# Patient Record
Sex: Female | Born: 1975 | Race: Black or African American | Hispanic: No | Smoking: Former smoker
Health system: Southern US, Community
[De-identification: ages and names within clinical notes are randomized; demographics above are authoritative.]

## PROBLEM LIST (undated history)

## (undated) ENCOUNTER — Ambulatory Visit: Source: Home / Self Care

## (undated) DIAGNOSIS — K8020 Calculus of gallbladder without cholecystitis without obstruction: Secondary | ICD-10-CM

## (undated) DIAGNOSIS — K219 Gastro-esophageal reflux disease without esophagitis: Secondary | ICD-10-CM

## (undated) DIAGNOSIS — I10 Essential (primary) hypertension: Secondary | ICD-10-CM

## (undated) DIAGNOSIS — I2699 Other pulmonary embolism without acute cor pulmonale: Secondary | ICD-10-CM

## (undated) HISTORY — DX: Calculus of gallbladder without cholecystitis without obstruction: K80.20

## (undated) HISTORY — DX: Other pulmonary embolism without acute cor pulmonale: I26.99

---

## 1995-03-16 HISTORY — PX: TUBAL LIGATION: SHX77

## 1998-07-03 ENCOUNTER — Other Ambulatory Visit: Admission: RE | Admit: 1998-07-03 | Discharge: 1998-07-03 | Payer: Self-pay | Admitting: Obstetrics

## 1998-07-07 ENCOUNTER — Ambulatory Visit (HOSPITAL_COMMUNITY): Admission: RE | Admit: 1998-07-07 | Discharge: 1998-07-07 | Payer: Self-pay | Admitting: Obstetrics

## 1998-07-07 ENCOUNTER — Encounter: Payer: Self-pay | Admitting: Obstetrics

## 2000-03-20 ENCOUNTER — Emergency Department (HOSPITAL_COMMUNITY): Admission: EM | Admit: 2000-03-20 | Discharge: 2000-03-20 | Payer: Self-pay | Admitting: Emergency Medicine

## 2000-05-08 ENCOUNTER — Inpatient Hospital Stay (HOSPITAL_COMMUNITY): Admission: AD | Admit: 2000-05-08 | Discharge: 2000-05-08 | Payer: Self-pay | Admitting: Obstetrics

## 2000-12-30 ENCOUNTER — Other Ambulatory Visit: Admission: RE | Admit: 2000-12-30 | Discharge: 2000-12-30 | Payer: Self-pay | Admitting: Internal Medicine

## 2000-12-30 ENCOUNTER — Encounter: Admission: RE | Admit: 2000-12-30 | Discharge: 2000-12-30 | Payer: Self-pay | Admitting: Internal Medicine

## 2001-01-02 ENCOUNTER — Encounter: Admission: RE | Admit: 2001-01-02 | Discharge: 2001-01-02 | Payer: Self-pay | Admitting: Internal Medicine

## 2001-01-02 ENCOUNTER — Encounter: Payer: Self-pay | Admitting: Internal Medicine

## 2001-05-22 ENCOUNTER — Encounter: Admission: RE | Admit: 2001-05-22 | Discharge: 2001-08-20 | Payer: Self-pay | Admitting: Internal Medicine

## 2005-12-14 ENCOUNTER — Encounter: Admission: RE | Admit: 2005-12-14 | Discharge: 2005-12-14 | Payer: Self-pay | Admitting: Internal Medicine

## 2007-01-06 ENCOUNTER — Ambulatory Visit (HOSPITAL_COMMUNITY): Admission: RE | Admit: 2007-01-06 | Discharge: 2007-01-06 | Payer: Self-pay | Admitting: Internal Medicine

## 2010-08-16 ENCOUNTER — Emergency Department (HOSPITAL_COMMUNITY): Payer: 59

## 2010-08-16 ENCOUNTER — Emergency Department (HOSPITAL_COMMUNITY)
Admission: EM | Admit: 2010-08-16 | Discharge: 2010-08-16 | Disposition: A | Discharge status: Home / Self Care | Payer: 59 | Attending: Emergency Medicine | Admitting: Emergency Medicine

## 2010-08-16 DIAGNOSIS — M25579 Pain in unspecified ankle and joints of unspecified foot: Secondary | ICD-10-CM | POA: Insufficient documentation

## 2010-08-16 DIAGNOSIS — X500XXA Overexertion from strenuous movement or load, initial encounter: Secondary | ICD-10-CM | POA: Insufficient documentation

## 2010-08-16 DIAGNOSIS — S93409A Sprain of unspecified ligament of unspecified ankle, initial encounter: Secondary | ICD-10-CM | POA: Insufficient documentation

## 2012-11-23 ENCOUNTER — Other Ambulatory Visit: Payer: Self-pay | Admitting: Internal Medicine

## 2012-11-23 DIAGNOSIS — N6029 Fibroadenosis of unspecified breast: Secondary | ICD-10-CM

## 2012-12-06 ENCOUNTER — Other Ambulatory Visit: Payer: 59

## 2013-01-02 ENCOUNTER — Ambulatory Visit
Admission: RE | Admit: 2013-01-02 | Discharge: 2013-01-02 | Disposition: A | Discharge status: Home / Self Care | Payer: 59 | Source: Ambulatory Visit | Attending: Internal Medicine | Admitting: Internal Medicine

## 2013-01-02 DIAGNOSIS — N6029 Fibroadenosis of unspecified breast: Secondary | ICD-10-CM

## 2014-01-02 ENCOUNTER — Other Ambulatory Visit: Payer: Self-pay | Admitting: Internal Medicine

## 2014-01-02 DIAGNOSIS — N63 Unspecified lump in unspecified breast: Secondary | ICD-10-CM

## 2014-01-14 ENCOUNTER — Ambulatory Visit
Admission: RE | Admit: 2014-01-14 | Discharge: 2014-01-14 | Disposition: A | Discharge status: Home / Self Care | Payer: 59 | Source: Ambulatory Visit | Attending: Internal Medicine | Admitting: Internal Medicine

## 2014-01-14 ENCOUNTER — Encounter (INDEPENDENT_AMBULATORY_CARE_PROVIDER_SITE_OTHER): Payer: Self-pay

## 2014-01-14 DIAGNOSIS — N63 Unspecified lump in unspecified breast: Secondary | ICD-10-CM

## 2014-05-28 ENCOUNTER — Ambulatory Visit (INDEPENDENT_AMBULATORY_CARE_PROVIDER_SITE_OTHER): Payer: 59 | Admitting: Physician Assistant

## 2014-05-28 VITALS — BP 134/85 | HR 84 | Temp 98.2°F | Resp 18 | Ht 63.0 in | Wt 225.0 lb

## 2014-05-28 DIAGNOSIS — S161XXA Strain of muscle, fascia and tendon at neck level, initial encounter: Secondary | ICD-10-CM | POA: Diagnosis not present

## 2014-05-28 DIAGNOSIS — M6248 Contracture of muscle, other site: Secondary | ICD-10-CM | POA: Diagnosis not present

## 2014-05-28 DIAGNOSIS — M62838 Other muscle spasm: Secondary | ICD-10-CM

## 2014-05-28 MED ORDER — CYCLOBENZAPRINE HCL 10 MG PO TABS: 10.0000 mg | ORAL_TABLET | Freq: Three times a day (TID) | ORAL | Status: DC | PRN

## 2014-05-28 MED ORDER — MELOXICAM 15 MG PO TABS: 15.0000 mg | ORAL_TABLET | Freq: Every day | ORAL | Status: DC

## 2014-05-28 NOTE — Patient Instructions (Signed)
Please do the exercises from the book.  Choose three exercises and hold for 10 seconds and repeat three times.    Use ice at the neck to bring down the inflammation 3-4 times per day, but you can apply heat before performing the exercises.  Do not take ibuprofen, motrin, or other anti-inflammatories while you are taking the mobic.

## 2014-05-28 NOTE — Progress Notes (Signed)
[facility] [address] [place] [address] [phone]  Date:  05/28/2014   Name:  Jessica Rodgers   DOB:  April 11, 1975   MRN:  [id number]  PCP:  Maximino Greenland, MD    Chief Complaint: Back Pain and Tingling   History of Present Illness:  Jessica Rodgers is a 39 y.o. very pleasant female patient who presents with the following:  Patient has neck pain and tingling that started yesterday.  She states that it began when she was pulling a shirt over her head.  She then put her neck to her chin as she always does, and reached to pull her hair locks through the shirt.  She then felt an intense pain at the right side of her neck which radiates down to her arm and through her hands.  She feels as if she has some weakness there.  There is no rash or erythema.  The pain along the arm intensified today and she has used motrin for relief which helped.      There are no active problems to display for this patient.   History reviewed. No pertinent past medical history.  Past Surgical History  Procedure Laterality Date  . Tubal ligation      History  Substance Use Topics  . Smoking status: Current Every Day Smoker  . Smokeless tobacco: Not on file  . Alcohol Use: Not on file    History reviewed. No pertinent family history.  Not on File  Medication list has been reviewed and updated.  No current outpatient prescriptions on file prior to visit.   No current facility-administered medications on file prior to visit.    Review of Systems: ROS otherwise unremarkable unless listed above.    Physical Examination: Filed Vitals:   05/28/14 2021  BP: 134/85  Pulse: 84  Temp: 98.2 F (36.8 C)  Resp: 18   Filed Vitals:   05/28/14 2021  Height: 5\' 3"  (1.6 m)  Weight: 225 lb (102.059 kg)   Body mass index is 39.87 kg/(m^2). Ideal Body Weight: Weight in (lb) to have BMI = 25: 140.8  Physical Exam  Constitutional: She is oriented to person, place, and time.  She appears well-developed and well-nourished.  HENT:  Head: Normocephalic and atraumatic.  Eyes: EOM are normal. Pupils are equal, round, and reactive to light.  Neck: Muscular tenderness (Musculature along right paraspinal cervical musculature and trapezius with muscle spasm noted.  ) present. No spinous process tenderness present. No erythema and normal range of motion present.  No cervical spinous tenderness.  Normal ROM throughout however pain with right side with rotation to the right, extension, and significantly with flexion.  Shooting pain occurred with this movement down the right arm.    Cardiovascular: Normal rate.   Pulmonary/Chest: Effort normal. No respiratory distress. She has no wheezes.  Musculoskeletal:  No swelling identified at the neck and upper extremities.  Normal ROM at the shoulder.  No tenderness at the olecranon, lateral, and medial epicondyle.  Normal ROM and strength with elbow flexion and extension.  Hand grip strength same bilaterally, however shooting pain at neck with prolonged gripping.  Negative Neer and Hawkins.  Negative Tinel and Phalen.    Neurological: She is alert and oriented to person, place, and time.  Skin: Skin is warm, dry and intact. No rash noted. No erythema.  Psychiatric: She has a normal mood and affect. Her behavior is normal.     Assessment and Plan:  39 year old female is here today for chief complaint of neck pain, and shooting tingling sensation down arm.  This appears to be routed at the neck.  Possible nerve impingement by musculature.  Advised to rtc after 7 days/and doing the following treatment plan.  Will consider xray and/or steroid injection.  -Flexeril at night -Mobic daily.  Advised to discontinue other anti-inflammatories.   -Advised ice 3-4 x per day for 15 minutes.  Heat before stretching exercises that were discussed and given as handout.    Muscle spasms of neck - Plan: meloxicam (MOBIC) 15 MG tablet, cyclobenzaprine  (FLEXERIL) 10 MG tablet  Strain of neck muscle, initial encounter - Plan: meloxicam (MOBIC) 15 MG tablet  Ivar Drape, PA-C [facility] 3/15/20169:10 PM

## 2014-06-01 NOTE — Progress Notes (Signed)
  Medical screening examination/treatment/procedure(s) were performed by non-physician practitioner and as supervising physician I was immediately available for consultation/collaboration.     

## 2015-02-13 ENCOUNTER — Other Ambulatory Visit: Payer: Self-pay | Admitting: Internal Medicine

## 2015-02-13 DIAGNOSIS — D242 Benign neoplasm of left breast: Secondary | ICD-10-CM

## 2015-02-20 ENCOUNTER — Ambulatory Visit
Admission: RE | Admit: 2015-02-20 | Discharge: 2015-02-20 | Disposition: A | Discharge status: Home / Self Care | Payer: 59 | Source: Ambulatory Visit | Attending: Internal Medicine | Admitting: Internal Medicine

## 2015-02-20 ENCOUNTER — Other Ambulatory Visit: Payer: Self-pay | Admitting: Internal Medicine

## 2015-02-20 DIAGNOSIS — D242 Benign neoplasm of left breast: Secondary | ICD-10-CM

## 2015-05-08 ENCOUNTER — Ambulatory Visit (INDEPENDENT_AMBULATORY_CARE_PROVIDER_SITE_OTHER): Payer: 59 | Admitting: Physician Assistant

## 2015-05-08 VITALS — BP 124/84 | HR 84 | Temp 99.1°F | Resp 16 | Ht 63.0 in | Wt 228.0 lb

## 2015-05-08 DIAGNOSIS — Z1159 Encounter for screening for other viral diseases: Secondary | ICD-10-CM

## 2015-05-08 DIAGNOSIS — Z113 Encounter for screening for infections with a predominantly sexual mode of transmission: Secondary | ICD-10-CM | POA: Diagnosis not present

## 2015-05-08 LAB — POCT WET + KOH PREP
TRICH BY WET PREP: ABSENT
YEAST BY KOH: ABSENT
YEAST BY WET PREP: ABSENT

## 2015-05-08 NOTE — Progress Notes (Signed)
05/09/2015 11:37 AM   DOB: 05-19-1975 / MRN: [id number]  SUBJECTIVE:  Jessica Rodgers is a 40 y.o. female presenting for a evaluation of STI and blood borne pathogens.  She recently learned her boyfriend was a heroin user and she is not sure if he was injected drugs, and thinks he may have multiple partners.  She denies any abnormal vaginal dishcarge.  Denies any dysuria frequency and urgency.    She has No Known Allergies.   She  has no past medical history on file.    She  reports that she has been smoking.  She does not have any smokeless tobacco history on file. She  has no sexual activity history on file. The patient  has past surgical history that includes Tubal ligation.  Her family history is not on file.  Review of Systems  Constitutional: Negative for fever and chills.  Eyes: Negative for blurred vision.  Respiratory: Negative for cough and shortness of breath.   Cardiovascular: Negative for chest pain.  Gastrointestinal: Negative for nausea and abdominal pain.  Genitourinary: Negative for dysuria, urgency and frequency.  Musculoskeletal: Negative for myalgias.  Skin: Negative for rash.  Neurological: Negative for dizziness, tingling and headaches.  Psychiatric/Behavioral: Negative for depression. The patient is not nervous/anxious.     Problem list and medications reviewed and updated by myself where necessary, and exist elsewhere in the encounter.   OBJECTIVE:  BP 124/84 mmHg  Pulse 84  Temp(Src) 99.1 F (37.3 C) (Oral)  Resp 16  Ht 5\' 3"  (1.6 m)  Wt 228 lb (103.42 kg)  BMI 40.40 kg/m2  SpO2 99%  LMP 04/29/2015  Wt Readings from Last 3 Encounters:  05/08/15 228 lb (103.42 kg)  05/28/14 225 lb (102.059 kg)   Physical Exam  Constitutional: She is oriented to person, place, and time. She appears well-nourished. No distress.  Eyes: EOM are normal. Pupils are equal, round, and reactive to light.  Cardiovascular: Normal rate.   Pulmonary/Chest: Effort normal.    Abdominal: She exhibits no distension.  Neurological: She is alert and oriented to person, place, and time. No cranial nerve deficit. Gait normal.  Skin: Skin is dry. She is not diaphoretic.  Psychiatric: She has a normal mood and affect.  Vitals reviewed.   Results for orders placed or performed in visit on 05/08/15 (from the past 72 hour(s))  POCT Wet + KOH Prep     Status: Abnormal   Collection Time: 05/08/15  6:48 PM  Result Value Ref Range   Yeast by KOH Absent Present, Absent   Yeast by wet prep Absent Present, Absent   WBC by wet prep Moderate (A) None, Few, Too numerous to count   Clue Cells Wet Prep HPF POC Few (A) None, Too numerous to count   Trich by wet prep Absent Present, Absent   Bacteria Wet Prep HPF POC Many (A) None, Few, Too numerous to count   Epithelial Cells By [employer] Pref ([facility]) Many (A) None, Few, Too numerous to count   RBC,UR,HPF,POC None None RBC/hpf    No results found.  ASSESSMENT AND PLAN  Mini was seen today for std testing.  Diagnoses and all orders for this visit:  Screening for STD (sexually transmitted disease) -     POCT Wet + KOH Prep -     GC/Chlamydia Probe Amp -     HIV antibody -     RPR  Need for hepatitis B screening test -     Hepatitis  B surface antigen -     Hepatitis B surface antibody  Need for hepatitis C screening test -     Hepatitis C antibody    The patient was advised to call or return to clinic if she does not see an improvement in symptoms or to seek the care of the closest emergency department if she worsens with the above plan.   Philis Fendt, MHS, PA-C [facility] 05/09/2015 11:37 AM

## 2015-05-09 LAB — HEPATITIS B SURFACE ANTIGEN: Hepatitis B Surface Ag: NEGATIVE

## 2015-05-09 LAB — HIV ANTIBODY (ROUTINE TESTING W REFLEX): HIV: NONREACTIVE

## 2015-05-09 LAB — HEPATITIS B SURFACE ANTIBODY, QUANTITATIVE: HEPATITIS B-POST: 0 m[IU]/mL

## 2015-05-09 LAB — HEPATITIS C ANTIBODY: HCV AB: NEGATIVE

## 2015-05-10 LAB — GC/CHLAMYDIA PROBE AMP
CT PROBE, AMP APTIMA: NOT DETECTED
GC Probe RNA: NOT DETECTED

## 2015-05-10 LAB — RPR

## 2016-02-20 ENCOUNTER — Other Ambulatory Visit: Payer: Self-pay | Admitting: Internal Medicine

## 2016-02-20 DIAGNOSIS — Z1231 Encounter for screening mammogram for malignant neoplasm of breast: Secondary | ICD-10-CM

## 2016-03-03 ENCOUNTER — Encounter: Payer: Self-pay | Admitting: Radiology

## 2016-03-03 ENCOUNTER — Ambulatory Visit
Admission: RE | Admit: 2016-03-03 | Discharge: 2016-03-03 | Disposition: A | Discharge status: Home / Self Care | Payer: 59 | Source: Ambulatory Visit | Attending: Internal Medicine | Admitting: Internal Medicine

## 2016-03-03 DIAGNOSIS — Z1231 Encounter for screening mammogram for malignant neoplasm of breast: Secondary | ICD-10-CM

## 2016-03-10 ENCOUNTER — Other Ambulatory Visit: Payer: Self-pay | Admitting: Internal Medicine

## 2016-03-10 DIAGNOSIS — R928 Other abnormal and inconclusive findings on diagnostic imaging of breast: Secondary | ICD-10-CM

## 2016-03-17 ENCOUNTER — Ambulatory Visit
Admission: RE | Admit: 2016-03-17 | Discharge: 2016-03-17 | Disposition: A | Discharge status: Home / Self Care | Payer: BLUE CROSS/BLUE SHIELD | Source: Ambulatory Visit | Attending: Internal Medicine | Admitting: Internal Medicine

## 2016-03-17 DIAGNOSIS — R928 Other abnormal and inconclusive findings on diagnostic imaging of breast: Secondary | ICD-10-CM

## 2016-11-04 ENCOUNTER — Encounter (HOSPITAL_COMMUNITY): Payer: Self-pay | Admitting: Emergency Medicine

## 2016-11-04 DIAGNOSIS — R6520 Severe sepsis without septic shock: Secondary | ICD-10-CM | POA: Diagnosis present

## 2016-11-04 DIAGNOSIS — N12 Tubulo-interstitial nephritis, not specified as acute or chronic: Secondary | ICD-10-CM | POA: Diagnosis present

## 2016-11-04 DIAGNOSIS — F17200 Nicotine dependence, unspecified, uncomplicated: Secondary | ICD-10-CM | POA: Diagnosis present

## 2016-11-04 DIAGNOSIS — J810 Acute pulmonary edema: Secondary | ICD-10-CM | POA: Diagnosis not present

## 2016-11-04 DIAGNOSIS — Z79899 Other long term (current) drug therapy: Secondary | ICD-10-CM

## 2016-11-04 DIAGNOSIS — I509 Heart failure, unspecified: Secondary | ICD-10-CM | POA: Diagnosis present

## 2016-11-04 DIAGNOSIS — I110 Hypertensive heart disease with heart failure: Secondary | ICD-10-CM | POA: Diagnosis present

## 2016-11-04 DIAGNOSIS — K529 Noninfective gastroenteritis and colitis, unspecified: Secondary | ICD-10-CM | POA: Diagnosis present

## 2016-11-04 DIAGNOSIS — D649 Anemia, unspecified: Secondary | ICD-10-CM | POA: Diagnosis not present

## 2016-11-04 DIAGNOSIS — E873 Alkalosis: Secondary | ICD-10-CM | POA: Diagnosis not present

## 2016-11-04 DIAGNOSIS — N3000 Acute cystitis without hematuria: Secondary | ICD-10-CM | POA: Diagnosis present

## 2016-11-04 DIAGNOSIS — E872 Acidosis: Secondary | ICD-10-CM | POA: Diagnosis present

## 2016-11-04 DIAGNOSIS — A419 Sepsis, unspecified organism: Principal | ICD-10-CM | POA: Diagnosis present

## 2016-11-04 DIAGNOSIS — E871 Hypo-osmolality and hyponatremia: Secondary | ICD-10-CM | POA: Diagnosis present

## 2016-11-04 DIAGNOSIS — R0902 Hypoxemia: Secondary | ICD-10-CM | POA: Diagnosis not present

## 2016-11-04 DIAGNOSIS — Z6841 Body Mass Index (BMI) 40.0 and over, adult: Secondary | ICD-10-CM

## 2016-11-04 DIAGNOSIS — E876 Hypokalemia: Secondary | ICD-10-CM | POA: Diagnosis present

## 2016-11-04 DIAGNOSIS — E860 Dehydration: Secondary | ICD-10-CM | POA: Diagnosis present

## 2016-11-04 NOTE — ED Triage Notes (Signed)
Pt reports severe abd pain with distention x2 days

## 2016-11-05 ENCOUNTER — Inpatient Hospital Stay (HOSPITAL_COMMUNITY)
Admission: EM | Admit: 2016-11-05 | Discharge: 2016-11-11 | DRG: 871 | Disposition: A | Discharge status: Home / Self Care | Payer: BLUE CROSS/BLUE SHIELD | Attending: Internal Medicine | Admitting: Internal Medicine

## 2016-11-05 ENCOUNTER — Encounter (HOSPITAL_COMMUNITY): Payer: Self-pay

## 2016-11-05 ENCOUNTER — Emergency Department (HOSPITAL_COMMUNITY): Payer: BLUE CROSS/BLUE SHIELD

## 2016-11-05 DIAGNOSIS — E871 Hypo-osmolality and hyponatremia: Secondary | ICD-10-CM | POA: Diagnosis present

## 2016-11-05 DIAGNOSIS — R0602 Shortness of breath: Secondary | ICD-10-CM

## 2016-11-05 DIAGNOSIS — F17200 Nicotine dependence, unspecified, uncomplicated: Secondary | ICD-10-CM | POA: Diagnosis present

## 2016-11-05 DIAGNOSIS — E873 Alkalosis: Secondary | ICD-10-CM

## 2016-11-05 DIAGNOSIS — N12 Tubulo-interstitial nephritis, not specified as acute or chronic: Secondary | ICD-10-CM | POA: Diagnosis present

## 2016-11-05 DIAGNOSIS — A419 Sepsis, unspecified organism: Secondary | ICD-10-CM | POA: Diagnosis present

## 2016-11-05 DIAGNOSIS — R0902 Hypoxemia: Secondary | ICD-10-CM | POA: Diagnosis not present

## 2016-11-05 DIAGNOSIS — I509 Heart failure, unspecified: Secondary | ICD-10-CM | POA: Diagnosis present

## 2016-11-05 DIAGNOSIS — E860 Dehydration: Secondary | ICD-10-CM | POA: Diagnosis present

## 2016-11-05 DIAGNOSIS — Z6839 Body mass index (BMI) 39.0-39.9, adult: Secondary | ICD-10-CM | POA: Diagnosis present

## 2016-11-05 DIAGNOSIS — K529 Noninfective gastroenteritis and colitis, unspecified: Secondary | ICD-10-CM | POA: Diagnosis present

## 2016-11-05 DIAGNOSIS — R6520 Severe sepsis without septic shock: Secondary | ICD-10-CM | POA: Diagnosis present

## 2016-11-05 DIAGNOSIS — Z79899 Other long term (current) drug therapy: Secondary | ICD-10-CM | POA: Diagnosis not present

## 2016-11-05 DIAGNOSIS — N3000 Acute cystitis without hematuria: Secondary | ICD-10-CM | POA: Diagnosis present

## 2016-11-05 DIAGNOSIS — J810 Acute pulmonary edema: Secondary | ICD-10-CM | POA: Diagnosis not present

## 2016-11-05 DIAGNOSIS — E876 Hypokalemia: Secondary | ICD-10-CM | POA: Diagnosis present

## 2016-11-05 DIAGNOSIS — I5030 Unspecified diastolic (congestive) heart failure: Secondary | ICD-10-CM | POA: Diagnosis not present

## 2016-11-05 DIAGNOSIS — R109 Unspecified abdominal pain: Secondary | ICD-10-CM | POA: Diagnosis not present

## 2016-11-05 DIAGNOSIS — D649 Anemia, unspecified: Secondary | ICD-10-CM | POA: Diagnosis not present

## 2016-11-05 DIAGNOSIS — I110 Hypertensive heart disease with heart failure: Secondary | ICD-10-CM | POA: Diagnosis present

## 2016-11-05 DIAGNOSIS — E872 Acidosis: Secondary | ICD-10-CM | POA: Diagnosis present

## 2016-11-05 DIAGNOSIS — Z6841 Body Mass Index (BMI) 40.0 and over, adult: Secondary | ICD-10-CM | POA: Diagnosis present

## 2016-11-05 LAB — URINALYSIS, ROUTINE W REFLEX MICROSCOPIC
BILIRUBIN URINE: NEGATIVE
GLUCOSE, UA: NEGATIVE mg/dL
Ketones, ur: 5 mg/dL — AB
Nitrite: NEGATIVE
Protein, ur: 30 mg/dL — AB
Specific Gravity, Urine: 1.013 (ref 1.005–1.030)
pH: 5 (ref 5.0–8.0)

## 2016-11-05 LAB — CBC WITH DIFFERENTIAL/PLATELET
Basophils Absolute: 0 10*3/uL (ref 0.0–0.1)
Basophils Relative: 0 %
EOS ABS: 0 10*3/uL (ref 0.0–0.7)
Eosinophils Relative: 0 %
HCT: 35.6 % — ABNORMAL LOW (ref 36.0–46.0)
HEMOGLOBIN: 12.4 g/dL (ref 12.0–15.0)
LYMPHS PCT: 5 %
Lymphs Abs: 1 10*3/uL (ref 0.7–4.0)
MCH: 34.2 pg — AB (ref 26.0–34.0)
MCHC: 34.8 g/dL (ref 30.0–36.0)
MCV: 98.1 fL (ref 78.0–100.0)
Monocytes Absolute: 0.2 10*3/uL (ref 0.1–1.0)
Monocytes Relative: 1 %
NEUTROS PCT: 94 %
Neutro Abs: 16.9 10*3/uL — ABNORMAL HIGH (ref 1.7–7.7)
Platelets: 283 10*3/uL (ref 150–400)
RBC: 3.63 MIL/uL — ABNORMAL LOW (ref 3.87–5.11)
RDW: 11.9 % (ref 11.5–15.5)
WBC: 18.1 10*3/uL — AB (ref 4.0–10.5)

## 2016-11-05 LAB — BLOOD GAS, VENOUS
Acid-base deficit: 1.5 mmol/L (ref 0.0–2.0)
Bicarbonate: 21.2 mmol/L (ref 20.0–28.0)
FIO2: 21
O2 Saturation: 84 %
PATIENT TEMPERATURE: 98.6
pCO2, Ven: 30.5 mmHg — ABNORMAL LOW (ref 44.0–60.0)
pH, Ven: 7.455 — ABNORMAL HIGH (ref 7.250–7.430)
pO2, Ven: 48.4 mmHg — ABNORMAL HIGH (ref 32.0–45.0)

## 2016-11-05 LAB — COMPREHENSIVE METABOLIC PANEL
ALT: 15 U/L (ref 14–54)
AST: 23 U/L (ref 15–41)
Albumin: 3.4 g/dL — ABNORMAL LOW (ref 3.5–5.0)
Alkaline Phosphatase: 49 U/L (ref 38–126)
Anion gap: 9 (ref 5–15)
BUN: 9 mg/dL (ref 6–20)
CO2: 22 mmol/L (ref 22–32)
CREATININE: 0.9 mg/dL (ref 0.44–1.00)
Calcium: 9 mg/dL (ref 8.9–10.3)
Chloride: 101 mmol/L (ref 101–111)
GFR calc Af Amer: >60 mL/min (ref >60)
GFR calc non Af Amer: >60 mL/min (ref >60)
Glucose, Bld: 115 mg/dL — ABNORMAL HIGH (ref 65–99)
POTASSIUM: 3.1 mmol/L — AB (ref 3.5–5.1)
Sodium: 132 mmol/L — ABNORMAL LOW (ref 135–145)
Total Bilirubin: 1.2 mg/dL (ref 0.3–1.2)
Total Protein: 6.9 g/dL (ref 6.5–8.1)

## 2016-11-05 LAB — MRSA PCR SCREENING: MRSA BY PCR: NEGATIVE

## 2016-11-05 LAB — TYPE AND SCREEN
ABO/RH(D): B POS
Antibody Screen: NEGATIVE

## 2016-11-05 LAB — ABO/RH: ABO/RH(D): B POS

## 2016-11-05 LAB — I-STAT BETA HCG BLOOD, ED (MC, WL, AP ONLY): I-stat hCG, quantitative: <5 m[IU]/mL (ref <5)

## 2016-11-05 LAB — LIPASE, BLOOD: Lipase: 20 U/L (ref 11–51)

## 2016-11-05 LAB — LACTIC ACID, PLASMA
LACTIC ACID, VENOUS: 1.5 mmol/L (ref 0.5–1.9)
Lactic Acid, Venous: 2 mmol/L (ref 0.5–1.9)

## 2016-11-05 LAB — I-STAT CG4 LACTIC ACID, ED
Lactic Acid, Venous: 1.49 mmol/L (ref 0.5–1.9)
Lactic Acid, Venous: 2.4 mmol/L (ref 0.5–1.9)

## 2016-11-05 MED ORDER — RISAQUAD PO CAPS
2.0000 | ORAL_CAPSULE | Freq: Every day | ORAL | Status: DC
  Administered 2016-11-06 – 2016-11-11 (×6): 2 via ORAL
  Filled 2016-11-05 (×6): qty 2

## 2016-11-05 MED ORDER — ENOXAPARIN SODIUM 40 MG/0.4ML ~~LOC~~ SOLN: 40.0000 mg | SUBCUTANEOUS | Status: DC

## 2016-11-05 MED ORDER — SODIUM CHLORIDE 0.9 % IV BOLUS (SEPSIS)
1000.0000 mL | Freq: Once | INTRAVENOUS | Status: AC
  Administered 2016-11-05: 1000 mL via INTRAVENOUS

## 2016-11-05 MED ORDER — MELOXICAM 15 MG PO TABS
15.0000 mg | ORAL_TABLET | Freq: Every day | ORAL | Status: DC
  Administered 2016-11-05: 15 mg via ORAL
  Filled 2016-11-05 (×2): qty 1

## 2016-11-05 MED ORDER — CYCLOBENZAPRINE HCL 10 MG PO TABS
10.0000 mg | ORAL_TABLET | Freq: Three times a day (TID) | ORAL | Status: DC | PRN
  Administered 2016-11-06 – 2016-11-08 (×2): 10 mg via ORAL
  Filled 2016-11-05 (×2): qty 1

## 2016-11-05 MED ORDER — SODIUM CHLORIDE 0.9 % IV BOLUS (SEPSIS): 500.0000 mL | Freq: Once | INTRAVENOUS | Status: DC

## 2016-11-05 MED ORDER — FENTANYL CITRATE (PF) 100 MCG/2ML IJ SOLN
50.0000 ug | Freq: Once | INTRAMUSCULAR | Status: AC
  Administered 2016-11-05: 50 ug via INTRAVENOUS
  Filled 2016-11-05: qty 2

## 2016-11-05 MED ORDER — IOPAMIDOL (ISOVUE-300) INJECTION 61%
100.0000 mL | Freq: Once | INTRAVENOUS | Status: AC | PRN
  Administered 2016-11-05: 100 mL via INTRAVENOUS

## 2016-11-05 MED ORDER — FENTANYL CITRATE (PF) 100 MCG/2ML IJ SOLN
25.0000 ug | Freq: Once | INTRAMUSCULAR | Status: AC
  Administered 2016-11-05: 25 ug via INTRAVENOUS
  Filled 2016-11-05: qty 2

## 2016-11-05 MED ORDER — ENOXAPARIN SODIUM 60 MG/0.6ML ~~LOC~~ SOLN
50.0000 mg | SUBCUTANEOUS | Status: DC
  Administered 2016-11-05: 50 mg via SUBCUTANEOUS
  Filled 2016-11-05: qty 0.5

## 2016-11-05 MED ORDER — SODIUM CHLORIDE 0.9 % IV BOLUS (SEPSIS): 1000.0000 mL | Freq: Once | INTRAVENOUS | Status: DC

## 2016-11-05 MED ORDER — ACETAMINOPHEN 325 MG PO TABS
650.0000 mg | ORAL_TABLET | Freq: Four times a day (QID) | ORAL | Status: DC | PRN
  Administered 2016-11-05 – 2016-11-11 (×7): 650 mg via ORAL
  Filled 2016-11-05 (×9): qty 2

## 2016-11-05 MED ORDER — POTASSIUM CHLORIDE IN NACL 40-0.9 MEQ/L-% IV SOLN
INTRAVENOUS | Status: DC
  Administered 2016-11-05 – 2016-11-06 (×3): 125 mL/h via INTRAVENOUS
  Filled 2016-11-05 (×3): qty 1000

## 2016-11-05 MED ORDER — NOREPINEPHRINE BITARTRATE 1 MG/ML IV SOLN: 0.0000 ug/min | Freq: Once | INTRAVENOUS | Status: DC

## 2016-11-05 MED ORDER — IOPAMIDOL (ISOVUE-300) INJECTION 61%
INTRAVENOUS | Status: AC
  Filled 2016-11-05: qty 100

## 2016-11-05 MED ORDER — IBUPROFEN 800 MG PO TABS
800.0000 mg | ORAL_TABLET | Freq: Once | ORAL | Status: AC
  Administered 2016-11-05: 800 mg via ORAL
  Filled 2016-11-05: qty 1

## 2016-11-05 MED ORDER — TRAMADOL HCL 50 MG PO TABS
50.0000 mg | ORAL_TABLET | ORAL | Status: DC | PRN
  Administered 2016-11-05 – 2016-11-06 (×2): 50 mg via ORAL
  Filled 2016-11-05 (×2): qty 1

## 2016-11-05 MED ORDER — ACETAMINOPHEN 650 MG RE SUPP
650.0000 mg | Freq: Once | RECTAL | Status: AC
Start: 2016-11-05 — End: 2016-11-05
  Administered 2016-11-05: 650 mg via RECTAL
  Filled 2016-11-05: qty 1

## 2016-11-05 MED ORDER — PIPERACILLIN-TAZOBACTAM 3.375 G IVPB
3.3750 g | Freq: Three times a day (TID) | INTRAVENOUS | Status: DC
  Administered 2016-11-05 – 2016-11-09 (×12): 3.375 g via INTRAVENOUS
  Filled 2016-11-05 (×12): qty 50

## 2016-11-05 MED ORDER — PIPERACILLIN-TAZOBACTAM 3.375 G IVPB 30 MIN
3.3750 g | Freq: Once | INTRAVENOUS | Status: AC
  Administered 2016-11-05: 3.375 g via INTRAVENOUS
  Filled 2016-11-05: qty 50

## 2016-11-05 NOTE — ED Notes (Signed)
Pt had 3L prior to Code Sepsis being called. Dr. Christy Gentles notified and wants the pt to receive an additional liter of saline to to make a total of 4L given.

## 2016-11-05 NOTE — ED Notes (Signed)
ED Provider at bedside. 

## 2016-11-05 NOTE — H&P (Signed)
History and Physical    Jessica Rodgers [id number] DOB: 25-Feb-1976 DOA: 11/05/2016  PCP: Glendale Chard, MD Patient coming from: home  Chief Complaint: abdominal pain  HPI: Jessica Rodgers is a 41 y.o. female . Admitted with abdominal pain, multiple bouts of nonbloody diarrhea associated with fever. She denies nausea or vomiting. She denies taking antibiotics recently. She denies any recent travel or sick contacts or eating contaminated food. She denies urinary complaints. She does complain of abdominal distention and abdominal pain. Her pain gets better with having a bowel movement. She has never had these symptoms in the past. She denies any headaches changes with her vision. She denies chest pain shortness of breath or cough. Patient lives alone at home. Currently her son is in the room with his fiance.    ED Course: She received multiple boluses of IV fluids for hypotension. She was seen by critical care and deemed that she was stable to be admitted to the floor. She did not receive any pressors. She has a labile blood pressure. She received IV Zosyn. Blood culture and urine cultures were sent from the ER. Significant labs in the ER potassium 3.1 white count 18.1 UA showed leukocyte esterase large amount, and too numerous to count WBCs. Her lactic acid level was 1.49 which went up to 2.40. Patient had a CT scan of the abdomen and pelvis which showed mild enterocolitis, gallstones probable and sludge with no evidence of active inflammation of the gallbladder. Minimal colonic diverticulitis was noted. chest x-ray did not reveal any evidence of pneumonia pleural effusion.  Review of Systems: As per HPI otherwise all other systems reviewed and are negative  Ambulatory Status:  History reviewed. No pertinent past medical history.  Past Surgical History:  Procedure Laterality Date  . TUBAL LIGATION      Social History   Social History  . Marital status: Divorced    Spouse name: N/A  .  Number of children: N/A  . Years of education: N/A   Occupational History  . Not on file.   Social History Main Topics  . Smoking status: Current Every Day Smoker  . Smokeless tobacco: Never Used  . Alcohol use Yes  . Drug use: No  . Sexual activity: Not on file   Other Topics Concern  . Not on file   Social History Narrative  . No narrative on file    No Known Allergies  History reviewed. No pertinent family history.    Prior to Admission medications   Medication Sig Start Date End Date Taking? Authorizing Provider  Cholecalciferol (VITAMIN D PO) Take 1 tablet by mouth daily.    Yes [provider]  ranitidine (ZANTAC) 150 MG tablet Take 150 mg by mouth 2 (two) times daily. 09/01/16  Yes [provider]  valsartan-hydrochlorothiazide (DIOVAN-HCT) 80-12.5 MG tablet Take 1 tablet by mouth daily. 08/31/16  Yes [provider]  cyclobenzaprine (FLEXERIL) 10 MG tablet Take 1 tablet (10 mg total) by mouth 3 (three) times daily as needed for muscle spasms. Patient not taking: Reported on 05/08/2015 05/28/14   Ivar Drape D, PA  meloxicam (MOBIC) 15 MG tablet Take 1 tablet (15 mg total) by mouth daily. Patient not taking: Reported on 05/08/2015 05/28/14   Joretta Bachelor, [place]    Physical Exam: Vitals:   11/05/16 0743 11/05/16 0746 11/05/16 0800 11/05/16 0830  BP: (!) 70/50 106/68 110/83 118/87  Pulse:  (!) 107 (!) 109 (!) 104  Resp:   (!) 29 Marland Kitchen)  28  Temp:  98.3 F (36.8 C)    TempSrc:  Oral    SpO2:   97% 98%  Weight:         General:  Appears calm and comfortable. Eyes:  PERRL, EOMI, normal lids, iris ENT:  grossly normal hearing, lips & tongue, mmm Neck:  no LAD, masses or thyromegaly Cardiovascular:  RRR, no m/r/g. No LE edema.  Respiratory:  CTA bilaterally, no w/r/r. Normal respiratory effort. Abdomen: Soft, generalized tenderness, no rebound, bowel sounds present. Skin:no rash or induration seen on limited exam Musculoskeletal:   grossly normal tone BUE/BLE, good ROM, no bony abnormality Psychiatric:  grossly normal mood and affect, speech fluent and appropriate, AOx3 Neurologic: Her white count. I had Dr. Zigmund Daniel on. Hold CN 2-12 grossly intact, moves all extremities in coordinated fashion, sensation intact  Labs on Admission: I have personally reviewed following labs and imaging studies  CBC:  Recent Labs Lab 11/05/16 0100  WBC 18.1*  NEUTROABS 16.9*  HGB 12.4  HCT 35.6*  MCV 98.1  PLT 109   Basic Metabolic Panel:  Recent Labs Lab 11/05/16 0100  NA 132*  K 3.1*  CL 101  CO2 22  GLUCOSE 115*  BUN 9  CREATININE 0.90  CALCIUM 9.0   GFR: CrCl cannot be calculated (Unknown ideal weight.). Liver Function Tests:  Recent Labs Lab 11/05/16 0100  AST 23  ALT 15  ALKPHOS 49  BILITOT 1.2  PROT 6.9  ALBUMIN 3.4*    Recent Labs Lab 11/05/16 0100  LIPASE 20   No results for input(s): AMMONIA in the last 168 hours. Coagulation Profile: No results for input(s): INR, PROTIME in the last 168 hours. Cardiac Enzymes: No results for input(s): CKTOTAL, CKMB, CKMBINDEX, TROPONINI in the last 168 hours. BNP (last 3 results) No results for input(s): PROBNP in the last 8760 hours. HbA1C: No results for input(s): HGBA1C in the last 72 hours. CBG: No results for input(s): GLUCAP in the last 168 hours. Lipid Profile: No results for input(s): CHOL, HDL, LDLCALC, TRIG, CHOLHDL, LDLDIRECT in the last 72 hours. Thyroid Function Tests: No results for input(s): TSH, T4TOTAL, FREET4, T3FREE, THYROIDAB in the last 72 hours. Anemia Panel: No results for input(s): VITAMINB12, FOLATE, FERRITIN, TIBC, IRON, RETICCTPCT in the last 72 hours. Urine analysis:    Component Value Date/Time   COLORURINE YELLOW 11/05/2016 0101   APPEARANCEUR HAZY (A) 11/05/2016 0101   LABSPEC 1.013 11/05/2016 0101   PHURINE 5.0 11/05/2016 0101   GLUCOSEU NEGATIVE 11/05/2016 0101   HGBUR SMALL (A) 11/05/2016 0101   BILIRUBINUR  NEGATIVE 11/05/2016 0101   KETONESUR 5 (A) 11/05/2016 0101   PROTEINUR 30 (A) 11/05/2016 0101   NITRITE NEGATIVE 11/05/2016 0101   LEUKOCYTESUR LARGE (A) 11/05/2016 0101    Creatinine Clearance: CrCl cannot be calculated (Unknown ideal weight.).  Sepsis Labs: @LABRCNTIP (procalcitonin:4,lacticidven:4) )No results found for this or any previous visit (from the past 240 hour(s)).   Radiological Exams on Admission: Ct Abdomen Pelvis W Contrast  Result Date: 11/05/2016 CLINICAL DATA:  Abdominal pain and distension. EXAM: CT ABDOMEN AND PELVIS WITH CONTRAST TECHNIQUE: Multidetector CT imaging of the abdomen and pelvis was performed using the standard protocol following bolus administration of intravenous contrast. CONTRAST:  100 cc Isovue-300 IV COMPARISON:  Radiographs earlier this day. FINDINGS: Lower chest: Compressive atelectasis at the right lung base adjacent to mild eventration of right hemidiaphragm. No pleural fluid. No consolidation. Hepatobiliary: No focal hepatic lesion. Gallbladder physiologically distended. Layering hyperdensity likely combination of small stones  and sludge. No biliary dilatation. Pancreas: No ductal dilatation or inflammation. Spleen: Normal in size without focal abnormality. Adrenals/Urinary Tract: No adrenal nodule. Homogeneous renal enhancement with symmetric excretion. No hydronephrosis or perinephric edema. Urinary bladder is physiologically distended, no bladder wall thickening. Stomach/Bowel: Stomach is nondistended. No bowel obstruction. Distal small bowel is fluid-filled with minimal mesenteric edema. The colon is fluid-filled, minimal pericolonic edema about the descending colon. There a few scattered colonic diverticular throughout the entire colon without diverticulitis. Normal appendix. Vascular/Lymphatic: No significant vascular findings are present. No enlarged abdominal or pelvic lymph nodes. Reproductive: Uterus is prominent size with soft tissue fullness  in the region of the cervix per no discrete focal mass. Ovaries are symmetric in size. No adnexal mass. Other: No free air, free fluid, or intra-abdominal fluid collection. Musculoskeletal: There are no acute or suspicious osseous abnormalities. IMPRESSION: 1. Fluid-filled large and small bowel with faint perienteric edema, suggesting mild enterocolitis. No obstruction. 2. Soft tissue fullness in the region of the cervix, may be incidental, recommend correlation with physical exam and up-to-date cervical cancer screening to exclude cervical mass. 3. Probable gallbladder stones and sludge. No gallbladder inflammation. 4. Minimal colonic diverticulosis. Electronically Signed   By: Jeb Levering M.D.   On: 11/05/2016 03:48   Dg Abd Acute W/chest  Result Date: 11/05/2016 CLINICAL DATA:  Fever.  Abdominal pain and distention. EXAM: DG ABDOMEN ACUTE W/ 1V CHEST COMPARISON:  None. FINDINGS: The cardiomediastinal silhouette is within normal limits. The lungs are hypoinflated with minimal atelectasis in the right mid to lower lung and left lung base. No lobar consolidation, edema, pleural effusion, or pneumothorax is identified. There is no evidence of intraperitoneal free air. A small amount of gas is present in the stomach, and there is a small amount of gas in a single nondilated small bowel loop in the left mid abdomen. No significant bowel gas is seen elsewhere. No acute osseous abnormality is seen. IMPRESSION: 1. Mild hypoinflation without evidence of acute cardiopulmonary process. 2. Paucity of bowel gas, limiting assessment for obstruction. Electronically Signed   By: Logan Bores M.D.   On: 11/05/2016 02:20    EKG: Independently reviewed.   Assessment/Plan Active Problems:   Pyelonephritis   Sepsis differential diagnosis includes pyelonephritis, enterocolitis. Patient qualifies for sepsis criteria due to fever and tachycardia, hypotension and elevated lactic acid level. CT scan evidence of  enterocolitis is very mild which makes me think this is may not be the cause of sepsis. Urine microscopy shows too numerous to count WBCs and large amount of leukocyte esterase. Follow up final urine culture. Patient did not have any CVA tenderness bilaterally. Follow blood culture which was drawn in the ER. Patient has had multiple bouts of nonbloody diarrhea. Will cover her with Zosyn and continue Zosyn to cover any intra-abdominal pathology. CT scan of the abdomen showed possible gallstones and sludge with no evidence of inflammation. Consider doing a right upper quadrant ultrasound gallbladder ultrasound if the symptoms persist. Though it's unlikely this is an acute cholecystitis. Patient will be given IV fluids with potassium to her blood pressure stabilizes completely. When I saw the patient she was very comfortable able to speak in full sentences she was awake alert and oriented. In fact while receiving the room she was on her cell phone.    leukocytosis secondary to sepsis.  Mild hyponatremia with a sodium of 1:30 to follow the levels.  Hypokalemia due to diarrhea replete potassium and follow-up levels.  Non-gap metabolic acidosis due to  diarrhea.        DVT prophylaxis:lovenox Code Status: full   Family Communication: with son Disposition Plan:home  Consults called:  Admission status:    Georgette Shell MD [facility]  If 7PM-7AM, please contact night-coverage [email or web address] Password [facility]  11/05/2016, 9:23 AM

## 2016-11-05 NOTE — ED Provider Notes (Signed)
I spoke to dr Rodena Piety with [facility] for admission When I reassessed patient, she is now becoming more hypotensive Will re-consult critical care    Ripley Fraise, MD 11/05/16 [phone]

## 2016-11-05 NOTE — ED Notes (Signed)
Patient aware that patient is requesting additional pain medication. Awaiting new orders.

## 2016-11-05 NOTE — ED Provider Notes (Signed)
[facility] DEPT Provider Note   CSN: [id number] Arrival date & time: 11/04/16  2158     History   Chief Complaint Chief Complaint  Patient presents with  . Abdominal Pain    HPI Jessica Rodgers is a 41 y.o. female.  The history is provided by the patient.  Abdominal Pain   This is a new problem. The current episode started 2 days ago. The problem occurs daily. The problem has been gradually worsening. The pain is located in the generalized abdominal region. The pain is severe. Associated symptoms include fever and diarrhea. Pertinent negatives include hematochezia, vomiting and dysuria. The symptoms are aggravated by certain positions. Nothing relieves the symptoms.   Pt reports gradual onset of diffuse abdominal pain She now reports distention and worsened pain She has had some diarrhea (nonbloody)   PMH - Hypertension Past Surgical History:  Procedure Laterality Date  . TUBAL LIGATION      OB History    No data available       Home Medications    Prior to Admission medications   Medication Sig Start Date End Date Taking? Authorizing Provider  Cholecalciferol (VITAMIN D PO) Take 1 tablet by mouth daily.    Yes [provider]  ranitidine (ZANTAC) 150 MG tablet Take 150 mg by mouth 2 (two) times daily. 09/01/16  Yes [provider]  valsartan-hydrochlorothiazide (DIOVAN-HCT) 80-12.5 MG tablet Take 1 tablet by mouth daily. 08/31/16  Yes [provider]  cyclobenzaprine (FLEXERIL) 10 MG tablet Take 1 tablet (10 mg total) by mouth 3 (three) times daily as needed for muscle spasms. Patient not taking: Reported on 05/08/2015 05/28/14   Ivar Drape D, PA  meloxicam (MOBIC) 15 MG tablet Take 1 tablet (15 mg total) by mouth daily. Patient not taking: Reported on 05/08/2015 05/28/14   Joretta Bachelor, PA    Family History History reviewed. No pertinent family history.  Social History Social History  Substance Use Topics  . Smoking  status: Current Every Day Smoker  . Smokeless tobacco: Never Used  . Alcohol use Yes     Allergies   Patient has no known allergies.   Review of Systems Review of Systems  Constitutional: Positive for fever.  Respiratory: Negative for cough.   Gastrointestinal: Positive for abdominal pain and diarrhea. Negative for hematochezia and vomiting.  Genitourinary: Negative for dysuria and flank pain.  Musculoskeletal: Negative for back pain.  All other systems reviewed and are negative.    Physical Exam Updated Vital Signs BP 119/75 (BP Location: Left Arm)   Pulse (!) 122   Temp (!) 100.9 F (38.3 C) (Oral)   Resp 20   LMP 10/24/2016   SpO2 98%   Physical Exam  CONSTITUTIONAL: Well developed/well nourished, ill appearing, uncomfortable appearing HEAD: Normocephalic/atraumatic EYES: EOMI/PERRL, no icterus ENMT: Mucous membranes moist NECK: supple no meningeal signs SPINE/BACK:entire spine nontender CV: S1/S2 noted, no murmurs/rubs/gallops noted LUNGS: Lungs are clear to auscultation bilaterally, no apparent distress ABDOMEN: soft, diffuse moderate tenderness noted, guarding noted.   GU:no cva tenderness NEURO: Pt is awake/alert/appropriate, moves all extremitiesx4.  No facial droop.   EXTREMITIES: pulses normal/equal, full ROM SKIN: warm, color normal PSYCH: no abnormalities of mood noted, alert and oriented to situation  ED Treatments / Results  Labs (all labs ordered are listed, but only abnormal results are displayed) Labs Reviewed  COMPREHENSIVE METABOLIC PANEL - Abnormal; Notable for the following:       Result Value   Sodium 132 (*)  Potassium 3.1 (*)    Glucose, Bld 115 (*)    Albumin 3.4 (*)    All other components within normal limits  CBC WITH DIFFERENTIAL/PLATELET - Abnormal; Notable for the following:    WBC 18.1 (*)    RBC 3.63 (*)    HCT 35.6 (*)    MCH 34.2 (*)    Neutro Abs 16.9 (*)    All other components within normal limits  URINALYSIS,  ROUTINE W REFLEX MICROSCOPIC - Abnormal; Notable for the following:    APPearance HAZY (*)    Hgb urine dipstick SMALL (*)    Ketones, ur 5 (*)    Protein, ur 30 (*)    Leukocytes, UA LARGE (*)    Bacteria, UA RARE (*)    Squamous Epithelial / LPF 0-5 (*)    All other components within normal limits  BLOOD GAS, VENOUS - Abnormal; Notable for the following:    pH, Ven 7.455 (*)    pCO2, Ven 30.5 (*)    pO2, Ven 48.4 (*)    All other components within normal limits  I-STAT CG4 LACTIC ACID, ED - Abnormal; Notable for the following:    Lactic Acid, Venous 2.40 (*)    All other components within normal limits  CULTURE, BLOOD (ROUTINE X 2)  CULTURE, BLOOD (ROUTINE X 2)  URINE CULTURE  LIPASE, BLOOD  I-STAT CG4 LACTIC ACID, ED  I-STAT BETA HCG BLOOD, ED ([facility], [facility], [facility] ONLY)  TYPE AND SCREEN  ABO/RH    EKG  EKG Interpretation None       Radiology Ct Abdomen Pelvis W Contrast  Result Date: 11/05/2016 CLINICAL DATA:  Abdominal pain and distension. EXAM: CT ABDOMEN AND PELVIS WITH CONTRAST TECHNIQUE: Multidetector CT imaging of the abdomen and pelvis was performed using the standard protocol following bolus administration of intravenous contrast. CONTRAST:  100 cc Isovue-300 IV COMPARISON:  Radiographs earlier this day. FINDINGS: Lower chest: Compressive atelectasis at the right lung base adjacent to mild eventration of right hemidiaphragm. No pleural fluid. No consolidation. Hepatobiliary: No focal hepatic lesion. Gallbladder physiologically distended. Layering hyperdensity likely combination of small stones and sludge. No biliary dilatation. Pancreas: No ductal dilatation or inflammation. Spleen: Normal in size without focal abnormality. Adrenals/Urinary Tract: No adrenal nodule. Homogeneous renal enhancement with symmetric excretion. No hydronephrosis or perinephric edema. Urinary bladder is physiologically distended, no bladder wall thickening. Stomach/Bowel: Stomach is nondistended. No  bowel obstruction. Distal small bowel is fluid-filled with minimal mesenteric edema. The colon is fluid-filled, minimal pericolonic edema about the descending colon. There a few scattered colonic diverticular throughout the entire colon without diverticulitis. Normal appendix. Vascular/Lymphatic: No significant vascular findings are present. No enlarged abdominal or pelvic lymph nodes. Reproductive: Uterus is prominent size with soft tissue fullness in the region of the cervix per no discrete focal mass. Ovaries are symmetric in size. No adnexal mass. Other: No free air, free fluid, or intra-abdominal fluid collection. Musculoskeletal: There are no acute or suspicious osseous abnormalities. IMPRESSION: 1. Fluid-filled large and small bowel with faint perienteric edema, suggesting mild enterocolitis. No obstruction. 2. Soft tissue fullness in the region of the cervix, may be incidental, recommend correlation with physical exam and up-to-date cervical cancer screening to exclude cervical mass. 3. Probable gallbladder stones and sludge. No gallbladder inflammation. 4. Minimal colonic diverticulosis. Electronically Signed   By: Jeb Levering M.D.   On: 11/05/2016 03:48   Dg Abd Acute W/chest  Result Date: 11/05/2016 CLINICAL DATA:  Fever.  Abdominal pain and distention.  EXAM: DG ABDOMEN ACUTE W/ 1V CHEST COMPARISON:  None. FINDINGS: The cardiomediastinal silhouette is within normal limits. The lungs are hypoinflated with minimal atelectasis in the right mid to lower lung and left lung base. No lobar consolidation, edema, pleural effusion, or pneumothorax is identified. There is no evidence of intraperitoneal free air. A small amount of gas is present in the stomach, and there is a small amount of gas in a single nondilated small bowel loop in the left mid abdomen. No significant bowel gas is seen elsewhere. No acute osseous abnormality is seen. IMPRESSION: 1. Mild hypoinflation without evidence of acute  cardiopulmonary process. 2. Paucity of bowel gas, limiting assessment for obstruction. Electronically Signed   By: Logan Bores M.D.   On: 11/05/2016 02:20    Procedures Procedures  CRITICAL CARE Performed by: Sharyon Cable Total critical care time: 45 minutes Critical care time was exclusive of separately billable procedures and treating other patients. Critical care was necessary to treat or prevent imminent or life-threatening deterioration. Critical care was time spent personally by me on the following activities: development of treatment plan with patient and/or surrogate as well as nursing, discussions with consultants, evaluation of patient's response to treatment, examination of patient, obtaining history from patient or surrogate, ordering and performing treatments and interventions, ordering and review of laboratory studies, ordering and review of radiographic studies, pulse oximetry and re-evaluation of patient's condition.   Medications Ordered in ED Medications  iopamidol (ISOVUE-300) 61 % injection (not administered)  sodium chloride 0.9 % bolus 1,000 mL (1,000 mLs Intravenous New Bag/Given 11/05/16 0600)    And  sodium chloride 0.9 % bolus 1,000 mL (1,000 mLs Intravenous Not Given 11/05/16 0607)    And  sodium chloride 0.9 % bolus 1,000 mL (1,000 mLs Intravenous Not Given 11/05/16 0608)    And  sodium chloride 0.9 % bolus 500 mL (500 mLs Intravenous Not Given 11/05/16 0611)  piperacillin-tazobactam (ZOSYN) IVPB 3.375 g (0 g Intravenous Stopped 11/05/16 0212)  fentaNYL (SUBLIMAZE) injection 50 mcg (50 mcg Intravenous Given 11/05/16 0120)  sodium chloride 0.9 % bolus 1,000 mL (0 mLs Intravenous Stopped 11/05/16 0326)  sodium chloride 0.9 % bolus 1,000 mL (0 mLs Intravenous Stopped 11/05/16 0357)  fentaNYL (SUBLIMAZE) injection 25 mcg (25 mcg Intravenous Given 11/05/16 0232)  acetaminophen (TYLENOL) suppository 650 mg (650 mg Rectal Given 11/05/16 0233)  iopamidol (ISOVUE-300) 61 %  injection 100 mL (100 mLs Intravenous Contrast Given 11/05/16 0329)  ibuprofen (ADVIL,MOTRIN) tablet 800 mg (800 mg Oral Given 11/05/16 0425)  sodium chloride 0.9 % bolus 1,000 mL (0 mLs Intravenous Stopped 11/05/16 0600)  fentaNYL (SUBLIMAZE) injection 25 mcg (25 mcg Intravenous Given 11/05/16 0425)     Initial Impression / Assessment and Plan / ED Course  I have reviewed the triage vital signs and the nursing notes.  Pertinent labs & imaging results that were available during my care of the patient were reviewed by me and considered in my medical decision making (see chart for details).     12:43 AM Will start with plain xray as I am concerned for significant abdominal pathology IV fluids/antibiotics ordered 2:26 AM Xray does not reveal free air Will proceed with Ct imaging Pt updated on plan 4:24 AM CT does not reveal any surgical emergency I have spoken to Dr Marisue Humble with radiology and we reviewed together, no ischemic bowel or other acute findings except mild enterocolitis Pt still tachycardic/febrile.  She has diffuse abd tenderness No abdominal rigidity She reports recent mild nonbloody  diarrhea No travel She is still toxic appearing Will repeat lactate and get VBG 5:34 AM Lactate worsening Pt still with abdominal tenderness Repeat BP when I was in room was 70s I have consulted critical care Code sepsis called, but pt already received IV fluids and IV zosyn earlier 6:26 AM D/w Dr Oletta Darter, he will send team to evaluate for ICU Pt awake/alert SBP improved into the 90s She will have total 4LNS  Final Clinical Impressions(s) / ED Diagnoses   Final diagnoses:  Acute cystitis without hematuria  Sepsis, due to unspecified organism [facility])  Dehydration  Intractable abdominal pain    New Prescriptions New Prescriptions   No medications on file     Ripley Fraise, MD 11/05/16 [phone]

## 2016-11-05 NOTE — Consult Note (Signed)
H&P: Consult requested by Dr. Christy Gentles for evaluation for admission to the medical ICU for sepsis. [name] is a 41 year old woman with past medical history of obesity, hypertension, acid reflux. Surgical history includes tubal ligation. She presented last evening to the emergency department with 3 days of abdominal pain moderate in intensity located in the bilateral lower quadrants continuous nonradiating associated with 3 episodes of loose stool yesterday somewhat better after bowel movements. Associated symptoms include abdominal distention she denies bloody stools, travel, eating suspicious foods, sick contacts or anyone else who is ill with similar symptoms. No family history of gastrointestinal illnesses. Exam she is afebrile blood pressure 91/62 with a mean atrial pressure of 71 pulse rate 120 sinus tachycardia respiratory rate 16 nonlabored oxygen saturation 100% on room air at the time my examination she sitting up bedside with her legs hanging over the edge of the stretcher easily hold herself up. She is alert and oriented 3 with no focal deficits unable speak comfortably in full sentences. Lungs are clear heart sounds are regular abdomen is protuberant soft no hepatosplenomegaly masses or rigidity rebound tenderness minimal guarding positive bowel sounds given his warm and dry. Lab work is notable for non-gap acidosis hyponatremia hypokalemia leukocytosis of 18 urinalysis with large leukocyte esterase and white blood cells. Lactate was initially 1.49 and increased to 2.4 chest x-ray shows no acute cardiopulmonary disease CT of the abdomen is notable for enterocolitis. In the emergency department she was treated with acetaminophen sentinel ibuprofen Zosyn and appropriate crystalloid fluid resuscitation  Problems: Severe sepsis 2/2 colitis Respiratory alkalosis Non-anion gap metabolic acidosis Hyponatremia Hypokalemia  Assessment/Reccomendations At this time the Jessica Rodgers appears to received  adequate treatmentfor sepsis with early antibiotics which would cover likely intra-abdominal pathogens and fluid resuscitation. She reports that she feels a small amount better than she did at the time she presented yesterday evening. She does have relative hypotension that developed since presenting to the emergency department and while that is likely largely due to her pathology improvement in her symptomatology with pain management may have also contributed to by the blood pressure decreased precipitously into the emergency department. On physical exam I see no indication to initiate vasopressors (including a map greater than 65). Since she has not manifested hemorrhagic diarrhea would not be as concerned with enterohemorrhagic Escherichia coli and risk of hemolytic uremic syndrome. At this point I would recommend admission to the stepdown unit, with consideration for transfer to the intensive care unit should she deteriorate, stool studies to attempt to identify the causative organism, continued antibiotics, following up blood cultures and urine cultures. She desire to continue with IV fluids I would recommend switching to abounds crystalloid to limit the worsening of her non-anion gap acidosis.  Replace electrolytes  Upon my evaluation, this patient had a high probability of imminent or life-threatening deterioration due to severe sepsis  high complexity decision making to assess, manipulate, and support vital organ system failure including assessing need for vasopressors  I have personally provided 35 minutes of critical care time exclusive of time spent on separately billable procedures and education. Time includes review and summation of previous medical record, laboratory data, radiology results, coordination of care with ED MD, and monitoring for potential decompensation. Interventions were performed as documented above.  Condition: Critical Prognosis: Guarded Code Status: Full  Jessica Garibaldi, MD Critical Care Medicine [place] Pager: [phone]

## 2016-11-05 NOTE — ED Notes (Signed)
Bed: [facility]37 Expected date:  Expected time:  Means of arrival:  Comments: t-2

## 2016-11-05 NOTE — ED Provider Notes (Signed)
D/w dr byrum, with critical care He is aware of patient and will make Elvina Sidle critical care physician aware of patient     Jessica Fraise, MD 11/05/16 [phone]

## 2016-11-05 NOTE — Care Management Note (Signed)
Case Management Note  Patient Details  Name: ALDEN FEAGAN MRN: [id number] Date of Birth: January 21, 1976  Subjective/Objective:                  ED Course: She received multiple boluses of IV fluids for hypotension. She was seen by critical care and deemed that she was stable to be admitted to the floor. She did not receive any pressors. She has a labile blood pressure. She received IV Zosyn. Blood culture and urine cultures were sent from the ER. Significant labs in the ER potassium 3.1 white count 18.1 UA showed leukocyte esterase large amount, and too numerous to count WBCs. Her lactic acid level was 1.49 which went up to 2.40. Patient had a CT scan of the abdomen and pelvis which showed mild enterocolitis, gallstones probable and sludge with no evidence of active inflammation of the gallbladder. Minimal colonic diverticulitis was noted. chest x-ray did not reveal any evidence of pneumonia pleural effusion.  Action/Plan: Date:  November 05, 2016 Chart reviewed for concurrent status and case management needs. Will continue to follow patient progress. Discharge Planning: following for needs Expected discharge date: [id number] Velva Harman, BSN, [place], [place]  Expected Discharge Date:   (UNKNOWN)               Expected Discharge Plan:  Home/Self Care  In-House Referral:     Discharge planning Services  CM Consult  Post Acute Care Choice:    Choice offered to:     DME Arranged:    DME Agency:     HH Arranged:    HH Agency:     Status of Service:  In process, will continue to follow  If discussed at Long Length of Stay Meetings, dates discussed:    Additional Comments:  Leeroy Cha, RN 11/05/2016, 1:26 PM

## 2016-11-05 NOTE — ED Provider Notes (Signed)
Seen by dr Purcell Nails with critical care He feels pt can be admitted to stepdown as BP improving BP ranging from 70s-90s Pt does appear improved Will consult medicine    Ripley Fraise, MD 11/05/16 [phone]

## 2016-11-05 NOTE — ED Notes (Signed)
Called 2W twice to verify nurse is ready, no answer. Agricultural consultant notified.

## 2016-11-05 NOTE — ED Provider Notes (Signed)
BP in right arm >100 Will not order pressors for now    Ripley Fraise, MD 11/05/16 [phone]

## 2016-11-06 DIAGNOSIS — Z6839 Body mass index (BMI) 39.0-39.9, adult: Secondary | ICD-10-CM | POA: Diagnosis present

## 2016-11-06 DIAGNOSIS — K529 Noninfective gastroenteritis and colitis, unspecified: Secondary | ICD-10-CM | POA: Diagnosis present

## 2016-11-06 DIAGNOSIS — Z6841 Body Mass Index (BMI) 40.0 and over, adult: Secondary | ICD-10-CM | POA: Diagnosis present

## 2016-11-06 DIAGNOSIS — A419 Sepsis, unspecified organism: Secondary | ICD-10-CM | POA: Diagnosis present

## 2016-11-06 LAB — BASIC METABOLIC PANEL
ANION GAP: 8 (ref 5–15)
BUN: 10 mg/dL (ref 6–20)
CHLORIDE: 112 mmol/L — AB (ref 101–111)
CO2: 18 mmol/L — AB (ref 22–32)
Calcium: 7.8 mg/dL — ABNORMAL LOW (ref 8.9–10.3)
Creatinine, Ser: 0.93 mg/dL (ref 0.44–1.00)
GFR calc non Af Amer: >60 mL/min (ref >60)
Glucose, Bld: 108 mg/dL — ABNORMAL HIGH (ref 65–99)
Potassium: 3.4 mmol/L — ABNORMAL LOW (ref 3.5–5.1)
Sodium: 138 mmol/L (ref 135–145)

## 2016-11-06 LAB — CBC
HCT: 30.6 % — ABNORMAL LOW (ref 36.0–46.0)
Hemoglobin: 10.5 g/dL — ABNORMAL LOW (ref 12.0–15.0)
MCH: 34.3 pg — AB (ref 26.0–34.0)
MCHC: 34.3 g/dL (ref 30.0–36.0)
MCV: 100 fL (ref 78.0–100.0)
PLATELETS: 235 10*3/uL (ref 150–400)
RBC: 3.06 MIL/uL — AB (ref 3.87–5.11)
RDW: 12.3 % (ref 11.5–15.5)
WBC: 20.8 10*3/uL — AB (ref 4.0–10.5)

## 2016-11-06 LAB — HEPATIC FUNCTION PANEL
ALT: 15 U/L (ref 14–54)
AST: 23 U/L (ref 15–41)
Albumin: 2.7 g/dL — ABNORMAL LOW (ref 3.5–5.0)
Alkaline Phosphatase: 55 U/L (ref 38–126)
BILIRUBIN DIRECT: 0.4 mg/dL (ref 0.1–0.5)
BILIRUBIN INDIRECT: 0.5 mg/dL (ref 0.3–0.9)
TOTAL PROTEIN: 6.6 g/dL (ref 6.5–8.1)
Total Bilirubin: 0.9 mg/dL (ref 0.3–1.2)

## 2016-11-06 LAB — URINE CULTURE

## 2016-11-06 LAB — GLUCOSE, CAPILLARY: GLUCOSE-CAPILLARY: 91 mg/dL (ref 65–99)

## 2016-11-06 LAB — C DIFFICILE QUICK SCREEN W PCR REFLEX
C DIFFICILE (CDIFF) INTERP: NOT DETECTED
C DIFFICLE (CDIFF) ANTIGEN: NEGATIVE
C Diff toxin: NEGATIVE

## 2016-11-06 LAB — LACTIC ACID, PLASMA: LACTIC ACID, VENOUS: 1.4 mmol/L (ref 0.5–1.9)

## 2016-11-06 LAB — HIV ANTIBODY (ROUTINE TESTING W REFLEX): HIV Screen 4th Generation wRfx: NONREACTIVE

## 2016-11-06 LAB — PROCALCITONIN: PROCALCITONIN: 2.5 ng/mL

## 2016-11-06 MED ORDER — POTASSIUM CHLORIDE IN NACL 20-0.9 MEQ/L-% IV SOLN
INTRAVENOUS | Status: DC
Start: 2016-11-06 — End: 2016-11-09
  Administered 2016-11-06 – 2016-11-09 (×6): via INTRAVENOUS
  Filled 2016-11-06 (×7): qty 1000

## 2016-11-06 MED ORDER — POTASSIUM CHLORIDE CRYS ER 20 MEQ PO TBCR
40.0000 meq | EXTENDED_RELEASE_TABLET | Freq: Once | ORAL | Status: AC
  Administered 2016-11-06: 40 meq via ORAL
  Filled 2016-11-06: qty 2

## 2016-11-06 MED ORDER — HYDROMORPHONE HCL-NACL 0.5-0.9 MG/ML-% IV SOSY
0.5000 mg | PREFILLED_SYRINGE | INTRAVENOUS | Status: DC | PRN
  Administered 2016-11-06 – 2016-11-11 (×24): 1 mg via INTRAVENOUS
  Filled 2016-11-06 (×24): qty 2

## 2016-11-06 MED ORDER — ENOXAPARIN SODIUM 40 MG/0.4ML ~~LOC~~ SOLN
40.0000 mg | SUBCUTANEOUS | Status: DC
  Administered 2016-11-06 – 2016-11-10 (×5): 40 mg via SUBCUTANEOUS
  Filled 2016-11-06 (×5): qty 0.4

## 2016-11-06 NOTE — Progress Notes (Signed)
[facility] PROGRESS NOTE  Jessica Rodgers XLK:[id number] DOB: 22-Aug-1975 DOA: 11/05/2016  PCP: Glendale Chard, MD  Brief History/Interval Summary: 41 year old African-American female with a past medical history of hypertension who presented with complaints of abdominal pain and multiple episodes of nonbloody diarrhea associated with fever. Apparently she ate chicken wings the day prior to her hospitalization along with a friend who apparently also got sick. Patient was evaluated in the ED. CT scan showed enterocolitis. Patient was hospitalized for further management.  Reason for Visit: Enterocolitis with sepsis  Consultants: None  Procedures: None  Antibiotics: Zosyn  Subjective/Interval History: Patient continues to complain of abdominal pain. She continues to have loose stools. No nausea. Denies any shortness of breath. Her mother is at the bedside.  ROS: Denies any chest pain  Objective:  Vital Signs  Vitals:   11/06/16 0500 11/06/16 0555 11/06/16 0700 11/06/16 0800  BP: 129/61  132/84 (!) 141/77  Pulse: 100  (!) 107 (!) 106  Resp: (!) 32  (!) 40 (!) 36  Temp:    (!) 101.1 F (38.4 C)  TempSrc:    Oral  SpO2: 94%  93% 94%  Weight:  110.2 kg (242 lb 15.2 oz)    Height:        Intake/Output Summary (Last 24 hours) at 11/06/16 1147 Last data filed at 11/06/16 0600  Gross per 24 hour  Intake          2658.75 ml  Output                0 ml  Net          2658.75 ml   Filed Weights   11/05/16 0537 11/05/16 1301 11/06/16 0555  Weight: 105.2 kg (232 lb) 107.8 kg (237 lb 10.5 oz) 110.2 kg (242 lb 15.2 oz)    General appearance: alert, cooperative, appears stated age and no distress Head: Normocephalic, without obvious abnormality, atraumatic Resp: clear to auscultation bilaterally Cardio: History and S2 is tachycardic. Regular. No S3, S4. No rubs, murmurs, or bruit. GI: Abdomen is obese. Tender diffusely without any rebound, rigidity or guarding. No masses or  organomegaly. Bowel sounds are present. Extremities: extremities normal, atraumatic, no cyanosis or edema Neurologic: Awake, alert. No obvious focal neurological deficits.  Lab Results:  Data Reviewed: I have personally reviewed following labs and imaging studies  CBC:  Recent Labs Lab 11/05/16 0100 11/06/16 0256  WBC 18.1* 20.8*  NEUTROABS 16.9*  --   HGB 12.4 10.5*  HCT 35.6* 30.6*  MCV 98.1 100.0  PLT 283 366    Basic Metabolic Panel:  Recent Labs Lab 11/05/16 0100 11/06/16 0256  NA 132* 138  K 3.1* 3.4*  CL 101 112*  CO2 22 18*  GLUCOSE 115* 108*  BUN 9 10  CREATININE 0.90 0.93  CALCIUM 9.0 7.8*    GFR: Estimated Creatinine Clearance: 93 mL/min (by C-G formula based on SCr of 0.93 mg/dL).  Liver Function Tests:  Recent Labs Lab 11/05/16 0100  AST 23  ALT 15  ALKPHOS 49  BILITOT 1.2  PROT 6.9  ALBUMIN 3.4*     Recent Labs Lab 11/05/16 0100  LIPASE 20    Recent Results (from the past 240 hour(s))  Blood culture (routine x 2)     Status: None (Preliminary result)   Collection Time: 11/05/16  1:01 AM  Result Value Ref Range Status   Specimen Description BLOOD BLOOD LEFT FOREARM  Final   Special Requests   Final  BOTTLES DRAWN AEROBIC AND ANAEROBIC Blood Culture adequate volume   Culture NO GROWTH 1 DAY  Final   Report Status PENDING  Incomplete  Blood culture (routine x 2)     Status: None (Preliminary result)   Collection Time: 11/05/16  1:01 AM  Result Value Ref Range Status   Specimen Description BLOOD BLOOD RIGHT FOREARM  Final   Special Requests   Final    BOTTLES DRAWN AEROBIC AND ANAEROBIC Blood Culture adequate volume   Culture NO GROWTH 1 DAY  Final   Report Status PENDING  Incomplete  Urine culture     Status: Abnormal   Collection Time: 11/05/16  1:22 AM  Result Value Ref Range Status   Specimen Description URINE, RANDOM  Final   Special Requests NONE  Final   Culture MULTIPLE SPECIES PRESENT, SUGGEST RECOLLECTION (A)   Final   Report Status 11/06/2016 FINAL  Final  MRSA PCR Screening     Status: None   Collection Time: 11/05/16  1:08 PM  Result Value Ref Range Status   MRSA by PCR NEGATIVE NEGATIVE Final    Comment:        The GeneXpert MRSA Assay (FDA approved for NASAL specimens only), is one component of a comprehensive MRSA colonization surveillance program. It is not intended to diagnose MRSA infection nor to guide or monitor treatment for MRSA infections.       Radiology Studies: Ct Abdomen Pelvis W Contrast  Result Date: 11/05/2016 CLINICAL DATA:  Abdominal pain and distension. EXAM: CT ABDOMEN AND PELVIS WITH CONTRAST TECHNIQUE: Multidetector CT imaging of the abdomen and pelvis was performed using the standard protocol following bolus administration of intravenous contrast. CONTRAST:  100 cc Isovue-300 IV COMPARISON:  Radiographs earlier this day. FINDINGS: Lower chest: Compressive atelectasis at the right lung base adjacent to mild eventration of right hemidiaphragm. No pleural fluid. No consolidation. Hepatobiliary: No focal hepatic lesion. Gallbladder physiologically distended. Layering hyperdensity likely combination of small stones and sludge. No biliary dilatation. Pancreas: No ductal dilatation or inflammation. Spleen: Normal in size without focal abnormality. Adrenals/Urinary Tract: No adrenal nodule. Homogeneous renal enhancement with symmetric excretion. No hydronephrosis or perinephric edema. Urinary bladder is physiologically distended, no bladder wall thickening. Stomach/Bowel: Stomach is nondistended. No bowel obstruction. Distal small bowel is fluid-filled with minimal mesenteric edema. The colon is fluid-filled, minimal pericolonic edema about the descending colon. There a few scattered colonic diverticular throughout the entire colon without diverticulitis. Normal appendix. Vascular/Lymphatic: No significant vascular findings are present. No enlarged abdominal or pelvic lymph nodes.  Reproductive: Uterus is prominent size with soft tissue fullness in the region of the cervix per no discrete focal mass. Ovaries are symmetric in size. No adnexal mass. Other: No free air, free fluid, or intra-abdominal fluid collection. Musculoskeletal: There are no acute or suspicious osseous abnormalities. IMPRESSION: 1. Fluid-filled large and small bowel with faint perienteric edema, suggesting mild enterocolitis. No obstruction. 2. Soft tissue fullness in the region of the cervix, may be incidental, recommend correlation with physical exam and up-to-date cervical cancer screening to exclude cervical mass. 3. Probable gallbladder stones and sludge. No gallbladder inflammation. 4. Minimal colonic diverticulosis. Electronically Signed   By: Jeb Levering M.D.   On: 11/05/2016 03:48   Dg Abd Acute W/chest  Result Date: 11/05/2016 CLINICAL DATA:  Fever.  Abdominal pain and distention. EXAM: DG ABDOMEN ACUTE W/ 1V CHEST COMPARISON:  None. FINDINGS: The cardiomediastinal silhouette is within normal limits. The lungs are hypoinflated with minimal atelectasis in the right  mid to lower lung and left lung base. No lobar consolidation, edema, pleural effusion, or pneumothorax is identified. There is no evidence of intraperitoneal free air. A small amount of gas is present in the stomach, and there is a small amount of gas in a single nondilated small bowel loop in the left mid abdomen. No significant bowel gas is seen elsewhere. No acute osseous abnormality is seen. IMPRESSION: 1. Mild hypoinflation without evidence of acute cardiopulmonary process. 2. Paucity of bowel gas, limiting assessment for obstruction. Electronically Signed   By: Logan Bores M.D.   On: 11/05/2016 02:20     Medications:  Scheduled: . acidophilus  2 capsule Oral Daily  . enoxaparin (LOVENOX) injection  40 mg Subcutaneous Q24H   Continuous: . 0.9 % NaCl with KCl 20 mEq / L 100 mL/hr at 11/06/16 0842  . piperacillin-tazobactam  Stopped (11/06/16 1012)  . sodium chloride     And  . sodium chloride     And  . sodium chloride     KGM:[id number], cyclobenzaprine, HYDROmorphone (DILAUDID) injection  Assessment/Plan:  Principal Problem:   Enterocolitis Active Problems:   Pyelonephritis   Sepsis ([place])   Obesity, Class III, BMI 40-49.9 (morbid obesity) ([place])    Sepsis most likely due to GI source/enterocolitis Patient with multiple episodes of watery diarrhea. Apparently her friend also developed symptoms after both of them ate chicken wings the day prior to admission. Pro-calcitonin level 2.5. Lactic acid level was elevated at 2.4. It is 1.4 this morning. Patient was started on Zosyn, which will be continued for now. Stool studies will be sent as she continues to have diarrhea. Patient denies any recent antibiotic use, exposure to anybody sick, or recent travel. Continue to monitor in stepdown setting for now.  Abnormal UA/concern for UTI. Patient denies any dysuria. However, numerous WBCs were noted on her UA. Continue Zosyn for now. Multiple species noted on urine culture. Unlikely this was the primary source of her symptoms.  History of essential hypertension. Antihypertensive medications on hold due to soft blood pressures. Continue to monitor closely.  Hypokalemia This will be repleted  Normocytic anemia Drop in hemoglobin, likely dilutional. No evidence for overt bleeding. Continue to monitor for now.  Probable gallstones noted incidentally on CT scan. LFTs were normal. She is not more tender in the right upper quadrant. She has diffuse generalized tenderness. Unclear this is contributing to her current presentation. Repeat LFTs. Will need outpatient ultrasound.  Morbid obesity Body mass index is 43.04 kg/m.  DVT Prophylaxis: Lovenox    Code Status: Full code  Family Communication: Discussed with the patient and her mother  Disposition Plan: Management as outlined above. Continue stepdown  setting for now.    LOS: 1 day   [facility] Pager [phone] 11/06/2016, 11:47 AM  If 7PM-7AM, please contact night-coverage at [email or web address], password [facility]

## 2016-11-07 LAB — GASTROINTESTINAL PANEL BY PCR, STOOL (REPLACES STOOL CULTURE)

## 2016-11-07 LAB — IRON AND TIBC
Iron: 15 ug/dL — ABNORMAL LOW (ref 28–170)
Saturation Ratios: 6 % — ABNORMAL LOW (ref 10.4–31.8)
TIBC: 234 ug/dL — ABNORMAL LOW (ref 250–450)
UIBC: 219 ug/dL

## 2016-11-07 LAB — RETICULOCYTES
RBC.: 2.87 MIL/uL — AB (ref 3.87–5.11)
RETIC COUNT ABSOLUTE: 60.3 10*3/uL (ref 19.0–186.0)
RETIC CT PCT: 2.1 % (ref 0.4–3.1)

## 2016-11-07 LAB — COMPREHENSIVE METABOLIC PANEL
ALBUMIN: 2.5 g/dL — AB (ref 3.5–5.0)
ALK PHOS: 53 U/L (ref 38–126)
ALT: 14 U/L (ref 14–54)
ANION GAP: 5 (ref 5–15)
AST: 18 U/L (ref 15–41)
BILIRUBIN TOTAL: 1.1 mg/dL (ref 0.3–1.2)
BUN: 8 mg/dL (ref 6–20)
CALCIUM: 7.8 mg/dL — AB (ref 8.9–10.3)
CO2: 19 mmol/L — ABNORMAL LOW (ref 22–32)
Chloride: 113 mmol/L — ABNORMAL HIGH (ref 101–111)
Creatinine, Ser: 0.98 mg/dL (ref 0.44–1.00)
GFR calc Af Amer: >60 mL/min (ref >60)
GLUCOSE: 137 mg/dL — AB (ref 65–99)
Potassium: 3.9 mmol/L (ref 3.5–5.1)
Sodium: 137 mmol/L (ref 135–145)
TOTAL PROTEIN: 6.5 g/dL (ref 6.5–8.1)

## 2016-11-07 LAB — CBC
HEMATOCRIT: 28.8 % — AB (ref 36.0–46.0)
Hemoglobin: 9.8 g/dL — ABNORMAL LOW (ref 12.0–15.0)
MCH: 34.1 pg — ABNORMAL HIGH (ref 26.0–34.0)
MCHC: 34 g/dL (ref 30.0–36.0)
MCV: 100.3 fL — AB (ref 78.0–100.0)
Platelets: 222 10*3/uL (ref 150–400)
RBC: 2.87 MIL/uL — ABNORMAL LOW (ref 3.87–5.11)
RDW: 12.6 % (ref 11.5–15.5)
WBC: 17.5 10*3/uL — AB (ref 4.0–10.5)

## 2016-11-07 LAB — FOLATE: Folate: 8 ng/mL (ref >5.9)

## 2016-11-07 LAB — FERRITIN: FERRITIN: 208 ng/mL (ref 11–307)

## 2016-11-07 LAB — VITAMIN B12: Vitamin B-12: 1332 pg/mL — ABNORMAL HIGH (ref 180–914)

## 2016-11-07 NOTE — Progress Notes (Signed)
[facility] PROGRESS NOTE  Jessica Rodgers ZOX:[id number] DOB: [birth year] DOA: 11/05/2016  PCP: Jessica Chard, MD  Brief History/Interval Summary: 41 year old African-American female with a past medical history of hypertension who presented with complaints of abdominal pain and multiple episodes of nonbloody diarrhea associated with fever. Apparently she ate chicken wings the day prior to her hospitalization along with a friend who apparently also got sick. Patient was evaluated in the ED. CT scan showed enterocolitis. Patient was hospitalized for further management.  Reason for Visit: Enterocolitis with sepsis  Consultants: None  Procedures: None  Antibiotics: Zosyn  Subjective/Interval History: Patient continues to have abdominal pain. Tells me that her diarrhea has subsided. She hasn't had any bowel movements since last night. No nausea, vomiting. Reports poor appetite. Her mother is at the bedside.   ROS: Denies any chest pain or shortness of breath.  Objective:  Vital Signs  Vitals:   11/07/16 0548 11/07/16 0600 11/07/16 0700 11/07/16 0800  BP:  123/73 117/72 (!) 127/51  Pulse: 100  95 (!) 108  Resp: (!) 28  (!) 26 (!) 24  Temp:      TempSrc:      SpO2: 97%  96% (!) 87%  Weight:      Height:        Intake/Output Summary (Last 24 hours) at 11/07/16 0818 Last data filed at 11/07/16 0500  Gross per 24 hour  Intake             2310 ml  Output              200 ml  Net             2110 ml   Filed Weights   11/05/16 0537 11/05/16 1301 11/06/16 0555  Weight: 105.2 kg (232 lb) 107.8 kg (237 lb 10.5 oz) 110.2 kg (242 lb 15.2 oz)    General appearance: Awake, alert. Anxious. No distress. Resp: Diminished air entry at the bases but mostly clear to auscultation. Cardio: S1, S2, is less tachycardic today. Regular. No S3, S4. No rubs, murmurs or bruits GI: Abdomen is obese. Remains tender diffusely without any rebound, rigidity or guarding. No masses or  organomegaly. Bowel sounds are present. Extremities: No edema Neurologic: Awake, alert. No obvious focal neurological deficits.  Lab Results:  Data Reviewed: I have personally reviewed following labs and imaging studies  CBC:  Recent Labs Lab 11/05/16 0100 11/06/16 0256 11/07/16 0329  WBC 18.1* 20.8* 17.5*  NEUTROABS 16.9*  --   --   HGB 12.4 10.5* 9.8*  HCT 35.6* 30.6* 28.8*  MCV 98.1 100.0 100.3*  PLT 283 235 811    Basic Metabolic Panel:  Recent Labs Lab 11/05/16 0100 11/06/16 0256 11/07/16 0329  NA 132* 138 137  K 3.1* 3.4* 3.9  CL 101 112* 113*  CO2 22 18* 19*  GLUCOSE 115* 108* 137*  BUN 9 10 8   CREATININE 0.90 0.93 0.98  CALCIUM 9.0 7.8* 7.8*    GFR: Estimated Creatinine Clearance: 88.2 mL/min (by C-G formula based on SCr of 0.98 mg/dL).  Liver Function Tests:  Recent Labs Lab 11/05/16 0100 11/06/16 1214 11/07/16 0329  AST 23 23 18   ALT 15 15 14   ALKPHOS 49 55 53  BILITOT 1.2 0.9 1.1  PROT 6.9 6.6 6.5  ALBUMIN 3.4* 2.7* 2.5*     Recent Labs Lab 11/05/16 0100  LIPASE 20    Recent Results (from the past 240 hour(s))  Blood culture (routine x 2)  Status: None (Preliminary result)   Collection Time: 11/05/16  1:01 AM  Result Value Ref Range Status   Specimen Description BLOOD BLOOD LEFT FOREARM  Final   Special Requests   Final    BOTTLES DRAWN AEROBIC AND ANAEROBIC Blood Culture adequate volume   Culture NO GROWTH 1 DAY  Final   Report Status PENDING  Incomplete  Blood culture (routine x 2)     Status: None (Preliminary result)   Collection Time: 11/05/16  1:01 AM  Result Value Ref Range Status   Specimen Description BLOOD BLOOD RIGHT FOREARM  Final   Special Requests   Final    BOTTLES DRAWN AEROBIC AND ANAEROBIC Blood Culture adequate volume   Culture NO GROWTH 1 DAY  Final   Report Status PENDING  Incomplete  Urine culture     Status: Abnormal   Collection Time: 11/05/16  1:22 AM  Result Value Ref Range Status   Specimen  Description URINE, RANDOM  Final   Special Requests NONE  Final   Culture MULTIPLE SPECIES PRESENT, SUGGEST RECOLLECTION (A)  Final   Report Status 11/06/2016 FINAL  Final  MRSA PCR Screening     Status: None   Collection Time: 11/05/16  1:08 PM  Result Value Ref Range Status   MRSA by PCR NEGATIVE NEGATIVE Final    Comment:        The GeneXpert MRSA Assay (FDA approved for NASAL specimens only), is one component of a comprehensive MRSA colonization surveillance program. It is not intended to diagnose MRSA infection nor to guide or monitor treatment for MRSA infections.   C difficile quick scan w PCR reflex     Status: None   Collection Time: 11/06/16 10:57 AM  Result Value Ref Range Status   C Diff antigen NEGATIVE NEGATIVE Final   C Diff toxin NEGATIVE NEGATIVE Final   C Diff interpretation No C. difficile detected.  Final      Radiology Studies: No results found.   Medications:  Scheduled: . acidophilus  2 capsule Oral Daily  . enoxaparin (LOVENOX) injection  40 mg Subcutaneous Q24H   Continuous: . 0.9 % NaCl with KCl 20 mEq / L 100 mL/hr at 11/07/16 0500  . piperacillin-tazobactam 3.375 g (11/07/16 0554)  . sodium chloride     And  . sodium chloride     And  . sodium chloride     TDD:[id number], cyclobenzaprine, HYDROmorphone (DILAUDID) injection  Assessment/Plan:  Principal Problem:   Enterocolitis Active Problems:   Pyelonephritis   Sepsis ([place])   Obesity, Class III, BMI 40-49.9 (morbid obesity) ([place])    Sepsis most likely due to GI source/enterocolitis Patient presented with with multiple episodes of watery diarrhea. Apparently her friend also developed symptoms after both of them ate chicken wings the day prior to admission. Pro-calcitonin level 2.5. Lactic acid level was elevated at 2.4. Subsequently, was normal. Stool studies were sent. C. difficile negative. GI pathogen panel is pending. Diarrhea appears to be subsiding. WBC is better today.  She continues to have fever on and off. Continue Zosyn for now. LFTs remain normal. Continue his clear liquids for now. She will remain in stepdown for now.   Abnormal UA/concern for UTI. Patient denies any dysuria. However, numerous WBCs were noted on her UA. Continue Zosyn for now. Multiple species noted on urine culture. Unlikely this was the primary source of her symptoms.  History of essential hypertension. Antihypertensive medications on hold due to soft blood pressures. Blood pressure remains stable. Heart  rate is better.  Hypokalemia This was repleted.  Normocytic anemia Drop in hemoglobin, likely dilutional. No evidence for overt bleeding. Anemia panel reviewed. Ferritin 208. Vitamin X-90 and folic acid level normal.  Probable gallstones noted incidentally on CT scan. LFTs were normal. She is not more tender in the right upper quadrant. She has diffuse generalized tenderness. Unclear this is contributing to her current presentation. Repeat LFTs are normal. Will need ultrasound to better evaluate, but can be pursued as an outpatient.  Morbid obesity Body mass index is 43.04 kg/m.  DVT Prophylaxis: Lovenox    Code Status: Full code  Family Communication: Discussed with the patient and her mother  Disposition Plan: Management as outlined above. Continue stepdown setting for now.    LOS: 2 days   [facility] Pager [phone] 11/07/2016, 8:18 AM  If 7PM-7AM, please contact night-coverage at [email or web address], password [facility]

## 2016-11-08 LAB — CBC
HCT: 30.2 % — ABNORMAL LOW (ref 36.0–46.0)
HEMOGLOBIN: 10.1 g/dL — AB (ref 12.0–15.0)
MCH: 33.2 pg (ref 26.0–34.0)
MCHC: 33.4 g/dL (ref 30.0–36.0)
MCV: 99.3 fL (ref 78.0–100.0)
PLATELETS: 242 10*3/uL (ref 150–400)
RBC: 3.04 MIL/uL — ABNORMAL LOW (ref 3.87–5.11)
RDW: 12.9 % (ref 11.5–15.5)
WBC: 12.6 10*3/uL — ABNORMAL HIGH (ref 4.0–10.5)

## 2016-11-08 LAB — BASIC METABOLIC PANEL
Anion gap: 7 (ref 5–15)
BUN: 6 mg/dL (ref 6–20)
CALCIUM: 8.5 mg/dL — AB (ref 8.9–10.3)
CHLORIDE: 109 mmol/L (ref 101–111)
CO2: 21 mmol/L — ABNORMAL LOW (ref 22–32)
CREATININE: 0.81 mg/dL (ref 0.44–1.00)
Glucose, Bld: 117 mg/dL — ABNORMAL HIGH (ref 65–99)
Potassium: 3.9 mmol/L (ref 3.5–5.1)
SODIUM: 137 mmol/L (ref 135–145)

## 2016-11-08 LAB — PROCALCITONIN: PROCALCITONIN: 0.98 ng/mL

## 2016-11-08 MED ORDER — PROMETHAZINE HCL 25 MG/ML IJ SOLN: 12.5000 mg | Freq: Once | INTRAMUSCULAR | Status: DC

## 2016-11-08 NOTE — Care Management Note (Signed)
Case Management Note  Patient Details  Name: Jessica Rodgers MRN: [id number] Date of Birth: 18-Apr-1975  Subjective/Objective:                  entercoliltis  Action/Plan: Date:  November 08, 2016 Chart reviewed for concurrent status and case management needs. Will continue to follow patient progress. Discharge Planning: following for needs Expected discharge date: [id number] Velva Harman, BSN, [place], [place]  Expected Discharge Date:   (UNKNOWN)               Expected Discharge Plan:  Home/Self Care  In-House Referral:     Discharge planning Services  CM Consult  Post Acute Care Choice:    Choice offered to:     DME Arranged:    DME Agency:     HH Arranged:    HH Agency:     Status of Service:  In process, will continue to follow  If discussed at Long Length of Stay Meetings, dates discussed:    Additional Comments:  Leeroy Cha, RN 11/08/2016, 8:36 AM

## 2016-11-08 NOTE — Progress Notes (Signed)
[facility] PROGRESS NOTE  Jessica Rodgers DTO:[id number] DOB: November 22, 1975 DOA: 11/05/2016  PCP: Glendale Chard, MD  Brief History/Interval Summary: 41 year old African-American female with a past medical history of hypertension who presented with complaints of abdominal pain and multiple episodes of nonbloody diarrhea associated with fever. Apparently she ate chicken wings the day prior to her hospitalization along with a friend who apparently also got sick. Patient was evaluated in the ED. CT scan showed enterocolitis. Patient was hospitalized for further management.  Reason for Visit: Enterocolitis with sepsis  Consultants: None  Procedures: None  Antibiotics: Zosyn  Subjective/Interval History: Patient's diarrhea appears to be subsiding. She hasn't had any bowel movement since 5 PM yesterday. Abdominal pain persists, but better than before. Tolerating her liquid diet. No nausea, vomiting. Her mother is at the bedside.   ROS: Denies any chest pain or shortness of breath.  Objective:  Vital Signs  Vitals:   11/08/16 0300 11/08/16 0400 11/08/16 0500 11/08/16 0600  BP: 112/66 115/68 117/75 (!) 130/92  Pulse: 87 83 87 86  Resp: 19 (!) 22 (!) 28 (!) 21  Temp:      TempSrc:      SpO2: 95% 95% 94% 96%  Weight:      Height:        Intake/Output Summary (Last 24 hours) at 11/08/16 0842 Last data filed at 11/08/16 0600  Gross per 24 hour  Intake             2230 ml  Output              300 ml  Net             1930 ml   Filed Weights   11/05/16 0537 11/05/16 1301 11/06/16 0555  Weight: 105.2 kg (232 lb) 107.8 kg (237 lb 10.5 oz) 110.2 kg (242 lb 15.2 oz)    General appearance: Awake, alert. In no distress Resp: Clear to auscultation bilaterally. Somewhat diminished at the bases.  Cardio: S1, S2 is normal, regular. No S3, S4. Tachycardia has resolved. GI: Abdomen is obese. Continues to have tenderness diffusely without any rebound, rigidity or guarding. No masses or  organomegaly. Bowel sounds are Present and normal. . Extremities: No edema Neurologic: Awake, alert. No obvious focal neurological deficits.  Lab Results:  Data Reviewed: I have personally reviewed following labs and imaging studies  CBC:  Recent Labs Lab 11/05/16 0100 11/06/16 0256 11/07/16 0329 11/08/16 0532  WBC 18.1* 20.8* 17.5* 12.6*  NEUTROABS 16.9*  --   --   --   HGB 12.4 10.5* 9.8* 10.1*  HCT 35.6* 30.6* 28.8* 30.2*  MCV 98.1 100.0 100.3* 99.3  PLT 283 235 222 983    Basic Metabolic Panel:  Recent Labs Lab 11/05/16 0100 11/06/16 0256 11/07/16 0329 11/08/16 0532  NA 132* 138 137 137  K 3.1* 3.4* 3.9 3.9  CL 101 112* 113* 109  CO2 22 18* 19* 21*  GLUCOSE 115* 108* 137* 117*  BUN 9 10 8 6   CREATININE 0.90 0.93 0.98 0.81  CALCIUM 9.0 7.8* 7.8* 8.5*    GFR: Estimated Creatinine Clearance: 106.7 mL/min (by C-G formula based on SCr of 0.81 mg/dL).  Liver Function Tests:  Recent Labs Lab 11/05/16 0100 11/06/16 1214 11/07/16 0329  AST 23 23 18   ALT 15 15 14   ALKPHOS 49 55 53  BILITOT 1.2 0.9 1.1  PROT 6.9 6.6 6.5  ALBUMIN 3.4* 2.7* 2.5*     Recent Labs Lab 11/05/16 0100  LIPASE 20    Recent Results (from the past 240 hour(s))  Blood culture (routine x 2)     Status: None (Preliminary result)   Collection Time: 11/05/16  1:01 AM  Result Value Ref Range Status   Specimen Description BLOOD BLOOD LEFT FOREARM  Final   Special Requests   Final    BOTTLES DRAWN AEROBIC AND ANAEROBIC Blood Culture adequate volume   Culture   Final    NO GROWTH 2 DAYS Performed at [facility], [address] [place], [place] [address]    Report Status PENDING  Incomplete  Blood culture (routine x 2)     Status: None (Preliminary result)   Collection Time: 11/05/16  1:01 AM  Result Value Ref Range Status   Specimen Description BLOOD BLOOD RIGHT FOREARM  Final   Special Requests   Final    BOTTLES DRAWN AEROBIC AND ANAEROBIC Blood Culture adequate  volume   Culture   Final    NO GROWTH 2 DAYS Performed at [facility], [place] [address], [place], [place] [address]    Report Status PENDING  Incomplete  Urine culture     Status: Abnormal   Collection Time: 11/05/16  1:22 AM  Result Value Ref Range Status   Specimen Description URINE, RANDOM  Final   Special Requests NONE  Final   Culture MULTIPLE SPECIES PRESENT, SUGGEST RECOLLECTION (A)  Final   Report Status 11/06/2016 FINAL  Final  MRSA PCR Screening     Status: None   Collection Time: 11/05/16  1:08 PM  Result Value Ref Range Status   MRSA by PCR NEGATIVE NEGATIVE Final    Comment:        The GeneXpert MRSA Assay (FDA approved for NASAL specimens only), is one component of a comprehensive MRSA colonization surveillance program. It is not intended to diagnose MRSA infection nor to guide or monitor treatment for MRSA infections.   Gastrointestinal Panel by PCR , Stool     Status: None   Collection Time: 11/06/16 10:57 AM  Result Value Ref Range Status   Campylobacter species NOT DETECTED NOT DETECTED Final   Plesimonas shigelloides NOT DETECTED NOT DETECTED Final   Salmonella species NOT DETECTED NOT DETECTED Final   Yersinia enterocolitica NOT DETECTED NOT DETECTED Final   Vibrio species NOT DETECTED NOT DETECTED Final   Vibrio cholerae NOT DETECTED NOT DETECTED Final   Enteroaggregative E coli (EAEC) NOT DETECTED NOT DETECTED Final   Enteropathogenic E coli (EPEC) NOT DETECTED NOT DETECTED Final   Enterotoxigenic E coli (ETEC) NOT DETECTED NOT DETECTED Final   Shiga like toxin producing E coli (STEC) NOT DETECTED NOT DETECTED Final   Shigella/Enteroinvasive E coli (EIEC) NOT DETECTED NOT DETECTED Final   Cryptosporidium NOT DETECTED NOT DETECTED Final   Cyclospora cayetanensis NOT DETECTED NOT DETECTED Final   Entamoeba histolytica NOT DETECTED NOT DETECTED Final   Giardia lamblia NOT DETECTED NOT DETECTED Final   Adenovirus F40/41 NOT DETECTED NOT DETECTED  Final   Astrovirus NOT DETECTED NOT DETECTED Final   Norovirus GI/GII NOT DETECTED NOT DETECTED Final   Rotavirus A NOT DETECTED NOT DETECTED Final   Sapovirus (I, II, IV, and V) NOT DETECTED NOT DETECTED Final  C difficile quick scan w PCR reflex     Status: None   Collection Time: 11/06/16 10:57 AM  Result Value Ref Range Status   C Diff antigen NEGATIVE NEGATIVE Final   C Diff toxin NEGATIVE NEGATIVE Final  C Diff interpretation No C. difficile detected.  Final      Radiology Studies: No results found.   Medications:  Scheduled: . acidophilus  2 capsule Oral Daily  . enoxaparin (LOVENOX) injection  40 mg Subcutaneous Q24H   Continuous: . 0.9 % NaCl with KCl 20 mEq / L 75 mL/hr at 11/07/16 2309  . piperacillin-tazobactam 3.375 g (11/08/16 0554)  . sodium chloride     And  . sodium chloride     And  . sodium chloride     NKN:[id number], cyclobenzaprine, HYDROmorphone (DILAUDID) injection  Assessment/Plan:  Principal Problem:   Enterocolitis Active Problems:   Pyelonephritis   Sepsis ([place])   Obesity, Class III, BMI 40-49.9 (morbid obesity) ([place])    Sepsis most likely due to GI source/enterocolitis Patient presented with with multiple episodes of watery diarrhea. Apparently her friend also developed symptoms after both of them ate chicken wings the day prior to admission. Pro-calcitonin level was 2.5. Improved to 0.98. Lactic acid level was elevated at 2.4. Subsequently, was normal. Stool studies were sent. C. difficile negative. GI pathogen panel is negative as well. Diarrhea appears to be subsiding. WBC is improving. Fever appears to subsided. Continue Zosyn for another day. Okay for transfer to the floor. Mobilize. Advance to full liquid diet.    Abnormal UA/concern for UTI. Patient denies any dysuria. However, numerous WBCs were noted on her UA. Continue Zosyn for now. Multiple species noted on urine culture. Unlikely this was the primary source of her  symptoms.  History of essential hypertension. Antihypertensive medications on hold due to soft blood pressures at admission. Blood pressure remains stable. Heart rate is better.  Hypokalemia This was repleted.  Normocytic anemia Drop in hemoglobin, likely dilutional. No evidence for overt bleeding. Anemia panel reviewed. Ferritin 208. Vitamin W-40 and folic acid level normal. Hemoglobin remains stable.  Probable gallstones noted incidentally on CT scan. LFTs were normal. She is not more tender in the right upper quadrant. She has diffuse generalized tenderness. Unlikely this is contributing to her current presentation. Repeat LFTs are normal. Will need ultrasound to better evaluate, but can be pursued as an outpatient.  Morbid obesity Body mass index is 43.04 kg/m.  DVT Prophylaxis: Lovenox    Code Status: Full code  Family Communication: Discussed with the patient and her mother  Disposition Plan: Management as outlined above. Okay for transfer to floor. Start mobilizing.    LOS: 3 days   [facility] Pager [phone] 11/08/2016, 8:42 AM  If 7PM-7AM, please contact night-coverage at [email or web address], password [facility]

## 2016-11-09 ENCOUNTER — Inpatient Hospital Stay (HOSPITAL_COMMUNITY): Payer: BLUE CROSS/BLUE SHIELD

## 2016-11-09 LAB — COMPREHENSIVE METABOLIC PANEL
ALK PHOS: 77 U/L (ref 38–126)
ALT: 17 U/L (ref 14–54)
AST: 18 U/L (ref 15–41)
Albumin: 2.2 g/dL — ABNORMAL LOW (ref 3.5–5.0)
Anion gap: 6 (ref 5–15)
BUN: 8 mg/dL (ref 6–20)
CALCIUM: 8.5 mg/dL — AB (ref 8.9–10.3)
CO2: 23 mmol/L (ref 22–32)
Chloride: 111 mmol/L (ref 101–111)
Creatinine, Ser: 0.82 mg/dL (ref 0.44–1.00)
GLUCOSE: 105 mg/dL — AB (ref 65–99)
Potassium: 3.9 mmol/L (ref 3.5–5.1)
SODIUM: 140 mmol/L (ref 135–145)
Total Bilirubin: 0.9 mg/dL (ref 0.3–1.2)
Total Protein: 6.1 g/dL — ABNORMAL LOW (ref 6.5–8.1)

## 2016-11-09 LAB — CBC
HCT: 28.8 % — ABNORMAL LOW (ref 36.0–46.0)
HEMOGLOBIN: 9.7 g/dL — AB (ref 12.0–15.0)
MCH: 33.3 pg (ref 26.0–34.0)
MCHC: 33.7 g/dL (ref 30.0–36.0)
MCV: 99 fL (ref 78.0–100.0)
Platelets: 286 10*3/uL (ref 150–400)
RBC: 2.91 MIL/uL — AB (ref 3.87–5.11)
RDW: 12.9 % (ref 11.5–15.5)
WBC: 9.5 10*3/uL (ref 4.0–10.5)

## 2016-11-09 MED ORDER — CIPROFLOXACIN HCL 500 MG PO TABS
500.0000 mg | ORAL_TABLET | Freq: Two times a day (BID) | ORAL | Status: DC
  Administered 2016-11-09 – 2016-11-11 (×5): 500 mg via ORAL
  Filled 2016-11-09 (×5): qty 1

## 2016-11-09 MED ORDER — METRONIDAZOLE 500 MG PO TABS
500.0000 mg | ORAL_TABLET | Freq: Three times a day (TID) | ORAL | Status: DC
  Administered 2016-11-09 – 2016-11-11 (×6): 500 mg via ORAL
  Filled 2016-11-09 (×6): qty 1

## 2016-11-09 MED ORDER — ALBUTEROL SULFATE (2.5 MG/3ML) 0.083% IN NEBU
5.0000 mg | INHALATION_SOLUTION | Freq: Once | RESPIRATORY_TRACT | Status: AC
  Administered 2016-11-09: 5 mg via RESPIRATORY_TRACT
  Filled 2016-11-09: qty 6

## 2016-11-09 MED ORDER — FUROSEMIDE 10 MG/ML IJ SOLN
40.0000 mg | Freq: Once | INTRAMUSCULAR | Status: AC
  Administered 2016-11-09: 40 mg via INTRAVENOUS
  Filled 2016-11-09: qty 4

## 2016-11-09 NOTE — Progress Notes (Signed)
OT Cancellation Note  Patient Details Name: Jessica Rodgers MRN: [id number] DOB: 1975-05-31   Cancelled Treatment:    Pt had just gotten back to bed after going to 3 n 1 and was fatigued. OT will return later this day or next day as schedule allows  Kari Baars, [place]  Payton Mccallum D 11/09/2016, 11:18 AM

## 2016-11-09 NOTE — Progress Notes (Addendum)
[facility] PROGRESS NOTE  KAYLIEGH BOYERS HAL:[id number] DOB: [birth year] DOA: 11/05/2016  PCP: Glendale Chard, MD  Brief History/Interval Summary: 41 year old African-American female with a past medical history of hypertension who presented with complaints of abdominal pain and multiple episodes of nonbloody diarrhea associated with fever. Apparently she ate chicken wings the day prior to her hospitalization along with a friend who apparently also got sick. Patient was evaluated in the ED. CT scan showed enterocolitis. Patient was hospitalized for further management.  Reason for Visit: Enterocolitis with sepsis  Consultants: None  Procedures: None  Antibiotics: Zosyn to be changed to Cipro and flagyl.  Subjective/Interval History: Patient states that her stools are more formed now. Did have low oxygen saturations levels this morning. Patient denies any chest pain. Abdominal pain has improved. She is tolerating her full liquid diet. No nausea or vomiting. Her mother is at the bedside.    ROS: Denies any headaches  Objective:  Vital Signs  Vitals:   11/09/16 0605 11/09/16 0737 11/09/16 0818 11/09/16 0938  BP: 140/89  139/79   Pulse: 69  68   Resp: 20  18   Temp: 99.7 F (37.6 C)  98.9 F (37.2 C)   TempSrc: Oral  Oral   SpO2: 92% 98% 97%   Weight:    113 kg (249 lb 3.2 oz)  Height:        Intake/Output Summary (Last 24 hours) at 11/09/16 1137 Last data filed at 11/09/16 1110  Gross per 24 hour  Intake              655 ml  Output             2200 ml  Net            -1545 ml   Filed Weights   11/05/16 1301 11/06/16 0555 11/09/16 0938  Weight: 107.8 kg (237 lb 10.5 oz) 110.2 kg (242 lb 15.2 oz) 113 kg (249 lb 3.2 oz)    General appearance: Awake, alert. In no distress Resp: Crackles noted bilateral bases. No wheezing or rhonchi. Cardio: S1, S2 is normal, regular. No S3, S4. GI: Abdomen is obese. Continues to have tenderness diffusely, but less so than  before. No rebound, rigidity or guarding. No masses or organomegaly. Bowel sounds are present and normal.. Extremities: No edema Neurologic: Awake, alert. No obvious focal neurological deficits.  Lab Results:  Data Reviewed: I have personally reviewed following labs and imaging studies  CBC:  Recent Labs Lab 11/05/16 0100 11/06/16 0256 11/07/16 0329 11/08/16 0532 11/09/16 0543  WBC 18.1* 20.8* 17.5* 12.6* 9.5  NEUTROABS 16.9*  --   --   --   --   HGB 12.4 10.5* 9.8* 10.1* 9.7*  HCT 35.6* 30.6* 28.8* 30.2* 28.8*  MCV 98.1 100.0 100.3* 99.3 99.0  PLT 283 235 222 242 735    Basic Metabolic Panel:  Recent Labs Lab 11/05/16 0100 11/06/16 0256 11/07/16 0329 11/08/16 0532 11/09/16 0543  NA 132* 138 137 137 140  K 3.1* 3.4* 3.9 3.9 3.9  CL 101 112* 113* 109 111  CO2 22 18* 19* 21* 23  GLUCOSE 115* 108* 137* 117* 105*  BUN 9 10 8 6 8   CREATININE 0.90 0.93 0.98 0.81 0.82  CALCIUM 9.0 7.8* 7.8* 8.5* 8.5*    GFR: Estimated Creatinine Clearance: 107 mL/min (by C-G formula based on SCr of 0.82 mg/dL).  Liver Function Tests:  Recent Labs Lab 11/05/16 0100 11/06/16 1214 11/07/16 0329 11/09/16 0543  AST 23 23 18 18   ALT 15 15 14 17   ALKPHOS 49 55 53 77  BILITOT 1.2 0.9 1.1 0.9  PROT 6.9 6.6 6.5 6.1*  ALBUMIN 3.4* 2.7* 2.5* 2.2*     Recent Labs Lab 11/05/16 0100  LIPASE 20    Recent Results (from the past 240 hour(s))  Blood culture (routine x 2)     Status: None (Preliminary result)   Collection Time: 11/05/16  1:01 AM  Result Value Ref Range Status   Specimen Description BLOOD BLOOD LEFT FOREARM  Final   Special Requests   Final    BOTTLES DRAWN AEROBIC AND ANAEROBIC Blood Culture adequate volume   Culture   Final    NO GROWTH 3 DAYS Performed at [facility], [address] [place], [place] [address]    Report Status PENDING  Incomplete  Blood culture (routine x 2)     Status: None (Preliminary result)   Collection Time: 11/05/16  1:01 AM    Result Value Ref Range Status   Specimen Description BLOOD BLOOD RIGHT FOREARM  Final   Special Requests   Final    BOTTLES DRAWN AEROBIC AND ANAEROBIC Blood Culture adequate volume   Culture   Final    NO GROWTH 3 DAYS Performed at [facility], [place] [address], [place],  [address]    Report Status PENDING  Incomplete  Urine culture     Status: Abnormal   Collection Time: 11/05/16  1:22 AM  Result Value Ref Range Status   Specimen Description URINE, RANDOM  Final   Special Requests NONE  Final   Culture MULTIPLE SPECIES PRESENT, SUGGEST RECOLLECTION (A)  Final   Report Status 11/06/2016 FINAL  Final  MRSA PCR Screening     Status: None   Collection Time: 11/05/16  1:08 PM  Result Value Ref Range Status   MRSA by PCR NEGATIVE NEGATIVE Final    Comment:        The GeneXpert MRSA Assay (FDA approved for NASAL specimens only), is one component of a comprehensive MRSA colonization surveillance program. It is not intended to diagnose MRSA infection nor to guide or monitor treatment for MRSA infections.   Gastrointestinal Panel by PCR , Stool     Status: None   Collection Time: 11/06/16 10:57 AM  Result Value Ref Range Status   Campylobacter species NOT DETECTED NOT DETECTED Final   Plesimonas shigelloides NOT DETECTED NOT DETECTED Final   Salmonella species NOT DETECTED NOT DETECTED Final   Yersinia enterocolitica NOT DETECTED NOT DETECTED Final   Vibrio species NOT DETECTED NOT DETECTED Final   Vibrio cholerae NOT DETECTED NOT DETECTED Final   Enteroaggregative E coli (EAEC) NOT DETECTED NOT DETECTED Final   Enteropathogenic E coli (EPEC) NOT DETECTED NOT DETECTED Final   Enterotoxigenic E coli (ETEC) NOT DETECTED NOT DETECTED Final   Shiga like toxin producing E coli (STEC) NOT DETECTED NOT DETECTED Final   Shigella/Enteroinvasive E coli (EIEC) NOT DETECTED NOT DETECTED Final   Cryptosporidium NOT DETECTED NOT DETECTED Final   Cyclospora cayetanensis NOT  DETECTED NOT DETECTED Final   Entamoeba histolytica NOT DETECTED NOT DETECTED Final   Giardia lamblia NOT DETECTED NOT DETECTED Final   Adenovirus F40/41 NOT DETECTED NOT DETECTED Final   Astrovirus NOT DETECTED NOT DETECTED Final   Norovirus GI/GII NOT DETECTED NOT DETECTED Final   Rotavirus A NOT DETECTED NOT DETECTED Final   Sapovirus (I, II, IV, and V) NOT DETECTED NOT DETECTED  Final  C difficile quick scan w PCR reflex     Status: None   Collection Time: 11/06/16 10:57 AM  Result Value Ref Range Status   C Diff antigen NEGATIVE NEGATIVE Final   C Diff toxin NEGATIVE NEGATIVE Final   C Diff interpretation No C. difficile detected.  Final      Radiology Studies: Dg Chest 2 View  Result Date: 11/09/2016 CLINICAL DATA:  Shortness of breath; sepsis, enterocolitis, current smoker. EXAM: CHEST  2 VIEW COMPARISON:  Chest x-ray of November 05, 2016 FINDINGS: The lungs are hypoinflated. There are increased densities at both bases compatible with atelectasis or pneumonia. Small bilateral pleural effusions are present. The cardiac silhouette is enlarged. The pulmonary vascularity is mildly engorged. The trachea is midline. The bony thorax exhibits no acute abnormality. IMPRESSION: Bibasilar atelectasis or pneumonia. Small bilateral pleural effusions. CHF with mild pulmonary vascular congestion. Electronically Signed   By: David  Martinique M.D.   On: 11/09/2016 07:32     Medications:  Scheduled: . acidophilus  2 capsule Oral Daily  . enoxaparin (LOVENOX) injection  40 mg Subcutaneous Q24H   Continuous: . piperacillin-tazobactam 3.375 g (11/09/16 9323)   FTD:[id number], cyclobenzaprine, HYDROmorphone (DILAUDID) injection  Assessment/Plan:  Principal Problem:   Enterocolitis Active Problems:   Pyelonephritis   Sepsis ([place])   Obesity, Class III, BMI 40-49.9 (morbid obesity) ([place])    Sepsis most likely due to GI source/enterocolitis Patient presented with with multiple episodes of  watery diarrhea. Apparently her friend also developed symptoms after both of them ate chicken wings the day prior to admission. Pro-calcitonin level was 2.5. Improved to 0.98. Lactic acid level was elevated at 2.4. Subsequently, was normal. Stool studies were sent. C. difficile negative. GI pathogen panel is negative as well. Diarrhea appears to be subsiding. Abdominal pain is improving. WBC is now normal. Patient was initially started on Zosyn. Change to oral Cipro and Flagyl. Fever appears to have subsided. Mobilize. Advance to soft diet.    Hypoxia/acute pulmonary edema She was noted to be hypoxic this morning. Chest x-ray was done which shows mild pulmonary edema. Atelectasis versus infiltrate noted in the lung bases. This is most likely atelectasis. She received IV fluids due to sepsis. She has gained weight in the last 3 days. She was given Lasix with good diuresis. We'll reevaluate tomorrow morning to see if she needs additional doses. We'll also order echocardiogram as no old reports seen in EMR. Cardiomegaly was noted on chest x-ray.  Abnormal UA/concern for UTI. Patient denies any dysuria. However, numerous WBCs were noted on her UA. Continue Zosyn for now. Multiple species noted on urine culture. Unlikely this was the primary source of her symptoms.  History of essential hypertension. Antihypertensive medications on hold due to soft blood pressures at admission. Blood pressure remains stable. Heart rate is better.  Hypokalemia This was repleted. Repeat tomorrow as patient was given Lasix.  Normocytic anemia Drop in hemoglobin, likely dilutional. No evidence for overt bleeding. Anemia panel reviewed. Ferritin 208. Vitamin J-62 and folic acid level normal. Hemoglobin remains stable.  Probable gallstones noted incidentally on CT scan. LFTs were normal. Unlikely this is contributing to her current presentation. Repeat LFTs are normal. Will need ultrasound to better evaluate, but can be  pursued as an outpatient.  Morbid obesity Body mass index is 43.04 kg/m.  DVT Prophylaxis: Lovenox    Code Status: Full code  Family Communication: Discussed with the patient and her mother  Disposition Plan: Management as outlined above. Lasix  to be given today. Change to oral antibiotics. Start mobilizing.    LOS: 4 days   [facility] Pager [phone] 11/09/2016, 11:37 AM  If 7PM-7AM, please contact night-coverage at [email or web address], password [facility]

## 2016-11-09 NOTE — Evaluation (Signed)
Physical Therapy Evaluation Patient Details Name: Jessica Rodgers MRN: [id number] DOB: 08-28-1975 Today's Date: 11/09/2016   History of Present Illness  41 year old African-American female with a past medical history of hypertension who presented with complaints of abdominal pain and multiple episodes of nonbloody diarrhea associated with fever.  Clinical Impression  Pt admitted with above diagnosis. Pt currently with functional limitations due to the deficits listed below (see PT Problem List). Pt ambulated 75' holding IV pole, distance limited by dizziness & fatigue, SaO2 98% on RA.  Pt will benefit from skilled PT to increase their independence and safety with mobility to allow discharge to the venue listed below.       Follow Up Recommendations No PT follow up    Equipment Recommendations  None recommended by PT    Recommendations for Other Services       Precautions / Restrictions Precautions Precautions: None Restrictions Weight Bearing Restrictions: No      Mobility  Bed Mobility Overal bed mobility: Modified Independent             General bed mobility comments: HOB up  Transfers Overall transfer level: Needs assistance Equipment used: None Transfers: Sit to/from Stand Sit to Stand: Supervision            Ambulation/Gait Ambulation/Gait assistance: Min guard Ambulation Distance (Feet): 70 Feet Assistive device:  (IV pole) Gait Pattern/deviations: Decreased stride length   Gait velocity interpretation: Below normal speed for age/gender General Gait Details: distance limited by fatigue/mild dizziness, no LOB, SaO2 98% RA, 2/4 dyspnea  Stairs            Wheelchair Mobility    Modified Rankin (Stroke Patients Only)       Balance Overall balance assessment: Modified Independent                                           Pertinent Vitals/Pain Pain Assessment: 0-10 Pain Score: 2  Pain Location: abdomen Pain Descriptors  / Indicators: Aching Pain Intervention(s): Limited activity within patient's tolerance;Monitored during session;Premedicated before session    Home Living Family/patient expects to be discharged to:: Private residence Living Arrangements: Alone Available Help at Discharge: Available 24 hours/day;Family (mom can assist)   Home Access: Level entry     Home Layout: One level Home Equipment: None      Prior Function Level of Independence: Independent         Comments: drives, no falls in past 1 year     Hand Dominance        Extremity/Trunk Assessment   Upper Extremity Assessment Upper Extremity Assessment: Overall WFL for tasks assessed    Lower Extremity Assessment Lower Extremity Assessment: Overall WFL for tasks assessed    Cervical / Trunk Assessment Cervical / Trunk Assessment: Normal  Communication   Communication: No difficulties  Cognition Arousal/Alertness: Awake/alert Behavior During Therapy: WFL for tasks assessed/performed Overall Cognitive Status: Within Functional Limits for tasks assessed                                        General Comments      Exercises     Assessment/Plan    PT Assessment Patient needs continued PT services  PT Problem List Decreased activity tolerance       PT Treatment Interventions  Gait training;Therapeutic activities;Therapeutic exercise;Patient/family education    PT Goals (Current goals can be found in the Care Plan section)  Acute Rehab PT Goals Patient Stated Goal: sing in reggae band PT Goal Formulation: With patient/family Time For Goal Achievement: 11/23/16 Potential to Achieve Goals: Good    Frequency Min 3X/week   Barriers to discharge        Co-evaluation               AM-PAC PT "6 Clicks" Daily Activity  Outcome Measure Difficulty turning over in bed (including adjusting bedclothes, sheets and blankets)?: None Difficulty moving from lying on back to sitting on the  side of the bed? : A Little Difficulty sitting down on and standing up from a chair with arms (e.g., wheelchair, bedside commode, etc,.)?: A Little Help needed moving to and from a bed to chair (including a wheelchair)?: A Little Help needed walking in hospital room?: A Little Help needed climbing 3-5 steps with a railing? : A Little 6 Click Score: 19    End of Session Equipment Utilized During Treatment: Gait belt Activity Tolerance: Patient limited by fatigue Patient left: in bed;with call bell/phone within reach;with family/visitor present Nurse Communication: Mobility status PT Visit Diagnosis: Dizziness and giddiness (R42);History of falling (Z91.81)    Time: 1205-1224 PT Time Calculation (min) (ACUTE ONLY): 19 min   Charges:   PT Evaluation $PT Eval Low Complexity: 1 Low     PT G Codes:          Philomena Doheny 11/09/2016, 12:40 PM [phone]

## 2016-11-09 NOTE — Progress Notes (Addendum)
When checking morning VSs, patient's O2 dropped to 86% on RA while patient feeling SOB, placed on 2L O2 and paged Provider on call. Chest xray & duoneb ordered. Continue to monitor.

## 2016-11-10 ENCOUNTER — Inpatient Hospital Stay (HOSPITAL_COMMUNITY): Payer: BLUE CROSS/BLUE SHIELD

## 2016-11-10 DIAGNOSIS — K529 Noninfective gastroenteritis and colitis, unspecified: Secondary | ICD-10-CM

## 2016-11-10 DIAGNOSIS — E860 Dehydration: Secondary | ICD-10-CM

## 2016-11-10 DIAGNOSIS — R109 Unspecified abdominal pain: Secondary | ICD-10-CM

## 2016-11-10 DIAGNOSIS — I5030 Unspecified diastolic (congestive) heart failure: Secondary | ICD-10-CM

## 2016-11-10 LAB — CULTURE, BLOOD (ROUTINE X 2)
Culture: NO GROWTH
Culture: NO GROWTH
Special Requests: ADEQUATE
Special Requests: ADEQUATE

## 2016-11-10 LAB — BASIC METABOLIC PANEL
ANION GAP: 9 (ref 5–15)
BUN: 8 mg/dL (ref 6–20)
CALCIUM: 8.4 mg/dL — AB (ref 8.9–10.3)
CHLORIDE: 106 mmol/L (ref 101–111)
CO2: 23 mmol/L (ref 22–32)
CREATININE: 0.81 mg/dL (ref 0.44–1.00)
GFR calc non Af Amer: >60 mL/min (ref >60)
Glucose, Bld: 99 mg/dL (ref 65–99)
Potassium: 3.5 mmol/L (ref 3.5–5.1)
SODIUM: 138 mmol/L (ref 135–145)

## 2016-11-10 LAB — ECHOCARDIOGRAM COMPLETE
HEIGHTINCHES: 63 in
Weight: 3987.2 oz

## 2016-11-10 LAB — PROCALCITONIN: Procalcitonin: 0.35 ng/mL

## 2016-11-10 MED ORDER — FUROSEMIDE 10 MG/ML IJ SOLN
40.0000 mg | Freq: Once | INTRAMUSCULAR | Status: AC
  Administered 2016-11-10: 40 mg via INTRAVENOUS
  Filled 2016-11-10: qty 4

## 2016-11-10 NOTE — Evaluation (Signed)
Occupational Therapy Evaluation Patient Details Name: Jessica Rodgers MRN: [id number] DOB: 05/28/1975 Today's Date: 11/10/2016    History of Present Illness 41 year old African-American female with a past medical history of hypertension who presented with complaints of abdominal pain and multiple episodes of nonbloody diarrhea associated with fever.   Clinical Impression   Pt admitted with stomach pain. Pt currently with functional limitations due to the deficits listed below (see OT Problem List).  Pt will benefit from skilled OT to increase their safety and independence with ADL and functional mobility for ADL to facilitate discharge to venue listed below.     Follow Up Recommendations  No OT follow up    Equipment Recommendations  None recommended by OT    Recommendations for Other Services       Precautions / Restrictions Precautions Precautions: Fall      Mobility Bed Mobility Overal bed mobility: Modified Independent             General bed mobility comments: HOB up  Transfers Overall transfer level: Needs assistance Equipment used: None Transfers: Sit to/from Stand Sit to Stand: Supervision              Balance Overall balance assessment: Modified Independent                                         ADL either performed or assessed with clinical judgement   ADL Overall ADL's : Needs assistance/impaired         Upper Body Bathing: Min guard;Standing   Lower Body Bathing: Minimal assistance;Sit to/from stand       Lower Body Dressing: Minimal assistance;Sit to/from stand   Toilet Transfer: Min Marine scientist Details (indicate cue type and reason): walking to bathroom.   Toileting- Clothing Manipulation and Hygiene: Supervision/safety;Sit to/from stand;Cueing for safety       Functional mobility during ADLs: Min guard General ADL Comments: pt overall S- min guard A with ADL activity .  Pt mom will A at  home as needed     Vision Patient Visual Report: No change from baseline       Perception     Praxis      Pertinent Vitals/Pain Pain Score: 4  Pain Location: abdomen Pain Descriptors / Indicators: Dull;Discomfort Pain Intervention(s): Limited activity within patient's tolerance;Monitored during session     Hand Dominance     Extremity/Trunk Assessment Upper Extremity Assessment Upper Extremity Assessment: Overall WFL for tasks assessed       Cervical / Trunk Assessment Cervical / Trunk Assessment: Normal   Communication Communication Communication: No difficulties   Cognition Arousal/Alertness: Awake/alert Behavior During Therapy: WFL for tasks assessed/performed Overall Cognitive Status: Within Functional Limits for tasks assessed                                     General Comments       Exercises     Shoulder Instructions      Home Living Family/patient expects to be discharged to:: Private residence Living Arrangements: Alone Available Help at Discharge: Available 24 hours/day;Family (mom can assist)   Home Access: Level entry     Home Layout: One level     Bathroom Shower/Tub: Teacher, early years/pre: Standard     Home Equipment: None  Prior Functioning/Environment Level of Independence: Independent        Comments: drives, no falls in past 1 year        OT Problem List:        OT Treatment/Interventions:      OT Goals(Current goals can be found in the care plan section) Acute Rehab OT Goals Patient Stated Goal: sing in reggae band OT Goal Formulation: With patient Time For Goal Achievement: 11/17/16 Potential to Achieve Goals: Good  OT Frequency:     Barriers to D/C:            Co-evaluation              AM-PAC PT "6 Clicks" Daily Activity     Outcome Measure Help from another person eating meals?: None Help from another person taking care of personal grooming?: None Help  from another person toileting, which includes using toliet, bedpan, or urinal?: A Little Help from another person bathing (including washing, rinsing, drying)?: A Little Help from another person to put on and taking off regular upper body clothing?: None Help from another person to put on and taking off regular lower body clothing?: A Little 6 Click Score: 21   End of Session Equipment Utilized During Treatment: Rolling walker  Activity Tolerance: Patient tolerated treatment well Patient left: Other (comment) (sitting edge of bed)                   Time: 2094-7096 OT Time Calculation (min): 29 min Charges:  OT General Charges $OT Visit: 1 Visit OT Evaluation $OT Eval Moderate Complexity: 1 Mod OT Treatments $Self Care/Home Management : 8-22 mins G-Codes:     Kari Baars, OT [phone]  Payton Mccallum D 11/10/2016, 2:58 PM

## 2016-11-10 NOTE — Progress Notes (Signed)
PROGRESS NOTE    Jessica Rodgers  RWE:[id number] DOB: 1975/04/20 DOA: 11/05/2016 PCP: Glendale Chard, MD    Brief Narrative:  41 year old African-American female with a past medical history of hypertension who presented with complaints of abdominal pain and multiple episodes of nonbloody diarrhea associated with fever. Apparently she ate chicken wings the day prior to her hospitalization along with a friend who apparently also got sick. Patient was evaluated in the ED. CT scan showed enterocolitis. Patient was hospitalized for further management.  Assessment & Plan:   Principal Problem:   Enterocolitis Active Problems:   Pyelonephritis   Sepsis ([place])   Obesity, Class III, BMI 40-49.9 (morbid obesity) ([place])  Sepsis most likely due to GI source/enterocolitis Patient presented with with multiple episodes of watery diarrhea. Apparently her friend also developed symptoms after both of them ate chicken wings the day prior to admission. Pro-calcitonin level initially noted to be was 2.5. Improved to 0.98. Lactic acid level was elevated at 2.4. Subsequently, was normal. Stool studies were sent. C. difficile negative. GI pathogen panel is negative as well. Diarrhea appears to be subsiding. Abdominal pain is improving. WBC is now normal. Patient was initially started on Zosyn, later transitioned oral Cipro and Flagyl. Fevers resolved. Continue activity as tolerated  Hypoxia/acute pulmonary edema She was noted to be hypoxic this morning. Chest x-ray was done which shows mild pulmonary edema. Atelectasis versus infiltrate noted in the lung bases. This is most likely atelectasis. She received IV fluids due to sepsis. She has gained weight in the last 3 days. Patient with residual LE edema, orthopnea. Will give another dose of IV lasix 40mg .  Abnormal UA/concern for UTI. Patient denies any dysuria. However, numerous WBCs were noted on her UA. Continue Zosyn for now. Multiple species noted on urine  culture. Unlikely this was the primary source of her symptoms. Stable at present  History of essential hypertension. Antihypertensive medications on hold due to soft blood pressures at admission. Blood pressure remains stable. Remains stable  Hypokalemia This was repleted. Repeat tomorrow as patient was given Lasix. Repeat bmet in AM  Normocytic anemia Drop in hemoglobin, likely dilutional. No evidence for overt bleeding. Anemia panel reviewed. Ferritin 208. Vitamin Y-19 and folic acid level normal. Hemoglobin remains stable. Repeat cbc in AM  Probable gallstones noted incidentally on CT scan. LFTs were normal. Unlikely this is contributing to her current presentation. Repeat LFTs are normal. Will need ultrasound to better evaluate, but can be pursued as an outpatient. Stable at present  Morbid obesity Body mass index is 43.04 kg/m. Stable  DVT prophylaxis: Lovenox subQ Code Status: Full Family Communication: Pt in room, family at bedside Disposition Plan: Uncertain at this time  Consultants:     Procedures:     Antimicrobials: Anti-infectives    Start     Dose/Rate Route Frequency Ordered Stop   11/09/16 1400  metroNIDAZOLE (FLAGYL) tablet 500 mg     500 mg Oral Every 8 hours 11/09/16 1145     11/09/16 1200  ciprofloxacin (CIPRO) tablet 500 mg     500 mg Oral 2 times daily 11/09/16 1145     11/05/16 1400  piperacillin-tazobactam (ZOSYN) IVPB 3.375 g  Status:  Discontinued     3.375 g 12.5 mL/hr over 240 Minutes Intravenous Every 8 hours 11/05/16 0922 11/09/16 1145   11/05/16 0045  piperacillin-tazobactam (ZOSYN) IVPB 3.375 g     3.375 g 100 mL/hr over 30 Minutes Intravenous  Once 11/05/16 0036 11/05/16 5093  Subjective: Complains of increased nausea  Objective: Vitals:   11/09/16 1329 11/09/16 2220 11/10/16 0500 11/10/16 1300  BP: 134/84 (!) 144/78 124/70 (!) 146/94  Pulse: 84 79 74 76  Resp: 18 20 20 18   Temp: 99.4 F (37.4 C) 99.5 F (37.5 C)  99.2 F (37.3 C) 98 F (36.7 C)  TempSrc: Oral Oral Oral Oral  SpO2: 97% 91% 94% 100%  Weight:      Height:        Intake/Output Summary (Last 24 hours) at 11/10/16 1701 Last data filed at 11/10/16 1500  Gross per 24 hour  Intake              360 ml  Output             2250 ml  Net            -1890 ml   Filed Weights   11/05/16 1301 11/06/16 0555 11/09/16 0938  Weight: 107.8 kg (237 lb 10.5 oz) 110.2 kg (242 lb 15.2 oz) 113 kg (249 lb 3.2 oz)    Examination:  General exam: Appears calm and comfortable  Respiratory system: Clear to auscultation. Respiratory effort normal. Cardiovascular system: S1 & S2 heard, RRR Gastrointestinal system: Abdomen is nondistended, soft and nontender. No organomegaly or masses felt. Normal bowel sounds heard. Central nervous system: Alert and oriented. No focal neurological deficits. Extremities: Symmetric 5 x 5 power. Skin: No rashes, lesions Psychiatry: Judgement and insight appear normal. Mood & affect appropriate.   Data Reviewed: I have personally reviewed following labs and imaging studies  CBC:  Recent Labs Lab 11/05/16 0100 11/06/16 0256 11/07/16 0329 11/08/16 0532 11/09/16 0543  WBC 18.1* 20.8* 17.5* 12.6* 9.5  NEUTROABS 16.9*  --   --   --   --   HGB 12.4 10.5* 9.8* 10.1* 9.7*  HCT 35.6* 30.6* 28.8* 30.2* 28.8*  MCV 98.1 100.0 100.3* 99.3 99.0  PLT 283 235 222 242 654   Basic Metabolic Panel:  Recent Labs Lab 11/06/16 0256 11/07/16 0329 11/08/16 0532 11/09/16 0543 11/10/16 0521  NA 138 137 137 140 138  K 3.4* 3.9 3.9 3.9 3.5  CL 112* 113* 109 111 106  CO2 18* 19* 21* 23 23  GLUCOSE 108* 137* 117* 105* 99  BUN 10 8 6 8 8   CREATININE 0.93 0.98 0.81 0.82 0.81  CALCIUM 7.8* 7.8* 8.5* 8.5* 8.4*   GFR: Estimated Creatinine Clearance: 108.3 mL/min (by C-G formula based on SCr of 0.81 mg/dL). Liver Function Tests:  Recent Labs Lab 11/05/16 0100 11/06/16 1214 11/07/16 0329 11/09/16 0543  AST 23 23 18 18   ALT  15 15 14 17   ALKPHOS 49 55 53 77  BILITOT 1.2 0.9 1.1 0.9  PROT 6.9 6.6 6.5 6.1*  ALBUMIN 3.4* 2.7* 2.5* 2.2*    Recent Labs Lab 11/05/16 0100  LIPASE 20   No results for input(s): AMMONIA in the last 168 hours. Coagulation Profile: No results for input(s): INR, PROTIME in the last 168 hours. Cardiac Enzymes: No results for input(s): CKTOTAL, CKMB, CKMBINDEX, TROPONINI in the last 168 hours. BNP (last 3 results) No results for input(s): PROBNP in the last 8760 hours. HbA1C: No results for input(s): HGBA1C in the last 72 hours. CBG:  Recent Labs Lab 11/06/16 1251  GLUCAP 91   Lipid Profile: No results for input(s): CHOL, HDL, LDLCALC, TRIG, CHOLHDL, LDLDIRECT in the last 72 hours. Thyroid Function Tests: No results for input(s): TSH, T4TOTAL, FREET4, T3FREE, THYROIDAB in the last  72 hours. Anemia Panel: No results for input(s): VITAMINB12, FOLATE, FERRITIN, TIBC, IRON, RETICCTPCT in the last 72 hours. Sepsis Labs:  Recent Labs Lab 11/05/16 0440 11/05/16 0854 11/05/16 1224 11/06/16 0821 11/08/16 0532 11/10/16 0521  PROCALCITON  --   --   --  2.50 0.98 0.35  LATICACIDVEN 2.40* 1.5 2.0* 1.4  --   --     Recent Results (from the past 240 hour(s))  Blood culture (routine x 2)     Status: None   Collection Time: 11/05/16  1:01 AM  Result Value Ref Range Status   Specimen Description BLOOD BLOOD LEFT FOREARM  Final   Special Requests   Final    BOTTLES DRAWN AEROBIC AND ANAEROBIC Blood Culture adequate volume   Culture   Final    NO GROWTH 5 DAYS Performed at [facility], [place] [address] [place], [place] [address]    Report Status 11/10/2016 FINAL  Final  Blood culture (routine x 2)     Status: None   Collection Time: 11/05/16  1:01 AM  Result Value Ref Range Status   Specimen Description BLOOD BLOOD RIGHT FOREARM  Final   Special Requests   Final    BOTTLES DRAWN AEROBIC AND ANAEROBIC Blood Culture adequate volume   Culture   Final    NO GROWTH 5  DAYS Performed at [facility], [place] [address], [place], [place] [address]    Report Status 11/10/2016 FINAL  Final  Urine culture     Status: Abnormal   Collection Time: 11/05/16  1:22 AM  Result Value Ref Range Status   Specimen Description URINE, RANDOM  Final   Special Requests NONE  Final   Culture MULTIPLE SPECIES PRESENT, SUGGEST RECOLLECTION (A)  Final   Report Status 11/06/2016 FINAL  Final  MRSA PCR Screening     Status: None   Collection Time: 11/05/16  1:08 PM  Result Value Ref Range Status   MRSA by PCR NEGATIVE NEGATIVE Final    Comment:        The GeneXpert MRSA Assay (FDA approved for NASAL specimens only), is one component of a comprehensive MRSA colonization surveillance program. It is not intended to diagnose MRSA infection nor to guide or monitor treatment for MRSA infections.   Gastrointestinal Panel by PCR , Stool     Status: None   Collection Time: 11/06/16 10:57 AM  Result Value Ref Range Status   Campylobacter species NOT DETECTED NOT DETECTED Final   Plesimonas shigelloides NOT DETECTED NOT DETECTED Final   Salmonella species NOT DETECTED NOT DETECTED Final   Yersinia enterocolitica NOT DETECTED NOT DETECTED Final   Vibrio species NOT DETECTED NOT DETECTED Final   Vibrio cholerae NOT DETECTED NOT DETECTED Final   Enteroaggregative E coli (EAEC) NOT DETECTED NOT DETECTED Final   Enteropathogenic E coli (EPEC) NOT DETECTED NOT DETECTED Final   Enterotoxigenic E coli (ETEC) NOT DETECTED NOT DETECTED Final   Shiga like toxin producing E coli (STEC) NOT DETECTED NOT DETECTED Final   Shigella/Enteroinvasive E coli (EIEC) NOT DETECTED NOT DETECTED Final   Cryptosporidium NOT DETECTED NOT DETECTED Final   Cyclospora cayetanensis NOT DETECTED NOT DETECTED Final   Entamoeba histolytica NOT DETECTED NOT DETECTED Final   Giardia lamblia NOT DETECTED NOT DETECTED Final   Adenovirus F40/41 NOT DETECTED NOT DETECTED Final   Astrovirus NOT DETECTED NOT  DETECTED Final   Norovirus GI/GII NOT DETECTED NOT DETECTED Final   Rotavirus A NOT DETECTED NOT DETECTED  Final   Sapovirus (I, II, IV, and V) NOT DETECTED NOT DETECTED Final  C difficile quick scan w PCR reflex     Status: None   Collection Time: 11/06/16 10:57 AM  Result Value Ref Range Status   C Diff antigen NEGATIVE NEGATIVE Final   C Diff toxin NEGATIVE NEGATIVE Final   C Diff interpretation No C. difficile detected.  Final     Radiology Studies: Dg Chest 2 View  Result Date: 11/09/2016 CLINICAL DATA:  Shortness of breath; sepsis, enterocolitis, current smoker. EXAM: CHEST  2 VIEW COMPARISON:  Chest x-ray of November 05, 2016 FINDINGS: The lungs are hypoinflated. There are increased densities at both bases compatible with atelectasis or pneumonia. Small bilateral pleural effusions are present. The cardiac silhouette is enlarged. The pulmonary vascularity is mildly engorged. The trachea is midline. The bony thorax exhibits no acute abnormality. IMPRESSION: Bibasilar atelectasis or pneumonia. Small bilateral pleural effusions. CHF with mild pulmonary vascular congestion. Electronically Signed   By: David  Martinique M.D.   On: 11/09/2016 07:32    Scheduled Meds: . acidophilus  2 capsule Oral Daily  . ciprofloxacin  500 mg Oral BID  . enoxaparin (LOVENOX) injection  40 mg Subcutaneous Q24H  . metroNIDAZOLE  500 mg Oral Q8H   Continuous Infusions:   LOS: 5 days   Sung Parodi, Orpah Melter, MD [facility] Pager [phone]  If 7PM-7AM, please contact night-coverage [email or web address] Password [email or web address] 11/10/2016, 5:01 PM

## 2016-11-10 NOTE — Progress Notes (Signed)
  Echocardiogram 2D Echocardiogram has been performed.  Jessica Rodgers 11/10/2016, 3:38 PM

## 2016-11-11 DIAGNOSIS — N3000 Acute cystitis without hematuria: Secondary | ICD-10-CM

## 2016-11-11 LAB — BASIC METABOLIC PANEL
Anion gap: 7 (ref 5–15)
BUN: 8 mg/dL (ref 6–20)
CALCIUM: 8.5 mg/dL — AB (ref 8.9–10.3)
CHLORIDE: 103 mmol/L (ref 101–111)
CO2: 27 mmol/L (ref 22–32)
CREATININE: 0.84 mg/dL (ref 0.44–1.00)
GFR calc Af Amer: >60 mL/min (ref >60)
Glucose, Bld: 111 mg/dL — ABNORMAL HIGH (ref 65–99)
Potassium: 3.1 mmol/L — ABNORMAL LOW (ref 3.5–5.1)
SODIUM: 137 mmol/L (ref 135–145)

## 2016-11-11 MED ORDER — KETOROLAC TROMETHAMINE 10 MG PO TABS: 10.0000 mg | ORAL_TABLET | Freq: Four times a day (QID) | ORAL | 0 refills | Status: DC | PRN

## 2016-11-11 MED ORDER — KETOROLAC TROMETHAMINE 30 MG/ML IJ SOLN: 30.0000 mg | Freq: Four times a day (QID) | INTRAMUSCULAR | Status: DC | PRN

## 2016-11-11 MED ORDER — RISAQUAD PO CAPS: 2.0000 | ORAL_CAPSULE | Freq: Every day | ORAL | 0 refills | Status: DC

## 2016-11-11 MED ORDER — METRONIDAZOLE 500 MG PO TABS: 500.0000 mg | ORAL_TABLET | Freq: Three times a day (TID) | ORAL | 0 refills | Status: DC

## 2016-11-11 MED ORDER — RANITIDINE HCL 150 MG PO TABS: 150.0000 mg | ORAL_TABLET | Freq: Two times a day (BID) | ORAL | 0 refills | Status: DC

## 2016-11-11 MED ORDER — POTASSIUM CHLORIDE CRYS ER 20 MEQ PO TBCR
40.0000 meq | EXTENDED_RELEASE_TABLET | Freq: Two times a day (BID) | ORAL | Status: DC
  Administered 2016-11-11: 40 meq via ORAL
  Filled 2016-11-11: qty 2

## 2016-11-11 MED ORDER — ONDANSETRON HCL 4 MG/2ML IJ SOLN
4.0000 mg | Freq: Four times a day (QID) | INTRAMUSCULAR | Status: DC | PRN
  Administered 2016-11-11: 4 mg via INTRAVENOUS
  Filled 2016-11-11: qty 2

## 2016-11-11 MED ORDER — CIPROFLOXACIN HCL 500 MG PO TABS: 500.0000 mg | ORAL_TABLET | Freq: Two times a day (BID) | ORAL | 0 refills | Status: DC

## 2016-11-11 MED ORDER — PANTOPRAZOLE SODIUM 40 MG PO TBEC: 40.0000 mg | DELAYED_RELEASE_TABLET | Freq: Every day | ORAL | Status: DC

## 2016-11-11 NOTE — Care Management Note (Signed)
Case Management Note  Patient Details  Name: Jessica Rodgers MRN: [id number] Date of Birth: 06-17-1975  Subjective/Objective:                  entercololitis and hypokalemia  Action/Plan: Date:  November 11, 2016 Chart reviewed for concurrent status and case management needs. Will continue to follow patient progress. Discharge Planning: following for needs Expected discharge date: [id number] Velva Harman, BSN, [place], [place]  Expected Discharge Date:   (UNKNOWN)               Expected Discharge Plan:  Home/Self Care  In-House Referral:     Discharge planning Services  CM Consult  Post Acute Care Choice:    Choice offered to:     DME Arranged:    DME Agency:     HH Arranged:    HH Agency:     Status of Service:  In process, will continue to follow  If discussed at Long Length of Stay Meetings, dates discussed:    Additional Comments:  Leeroy Cha, RN 11/11/2016, 9:23 AM

## 2016-11-11 NOTE — Progress Notes (Signed)
Patient given discharge instructions, and verbalized an understanding of all discharge instructions.  Patient agrees with discharge plan, and is being discharged in stable medical condition.  Patient given transportation via wheelchair. 

## 2016-11-11 NOTE — Discharge Summary (Signed)
Physician Discharge Summary  Jessica Rodgers:[id number] DOB: 1975-12-21 DOA: 11/05/2016  PCP: Glendale Chard, MD  Admit date: 11/05/2016 Discharge date: 11/11/2016  Admitted From: Home Disposition:  Home  Recommendations for Outpatient Follow-up:  1. Follow up with PCP in 1-2 weeks 2. Recommend referral to GI in 2-3 weeks after symptoms resolve  Discharge Condition:Improved CODE STATUS:Full Diet recommendation: Regular   Brief/Interim Summary: 41 year old African-American female with a past medical history of hypertension who presented with complaints of abdominal pain and multiple episodes of nonbloody diarrhea associated with fever. Apparently she ate chicken wings the day prior to her hospitalization along with a friend who apparently also got sick. Patient was evaluated in the ED. CT scan showed enterocolitis. Patient was hospitalized for further management.  Sepsis most likely due to GI source/enterocolitis Patient presented with with multiple episodes of watery diarrhea. Apparently her friend also developed symptoms after both of them ate chicken wings the day prior to admission. Pro-calcitonin level initially noted to be was 2.5. Improved to 0.98. Lactic acid level was elevated at 2.4. Subsequently, was normal. Stool studies were sent. C. difficile negative. GI pathogen panel is negative as well. Diarrhea appears to be subsiding. Abdominal pain is improving. WBC is now normal. Patient was initially started on Zosyn, later transitioned oral Cipro and Flagyl. Fevers resolved. Pt to complete course with PO cipro and flagyl  Hypoxia/acute pulmonary edema She was noted to be hypoxic this morning. Chest x-ray was done which shows mild pulmonary edema. Atelectasis versus infiltrate noted in the lung bases. This is most likely atelectasis. She received IV fluids due to sepsis. She has gained weight in the last 3 days. Patient with residual LE edema, orthopnea. Improved with IV lasix  diuresis  Abnormal UA/concern for UTI. Patient denies any dysuria. However, numerous WBCs were noted on her UA. Continue Zosyn for now. Multiple species noted on urine culture. Unlikely this was the primary source of her symptoms. Stable at present  History of essential hypertension. Antihypertensive medications on hold due to soft blood pressures at admission. Blood pressure remains stable. Remains stable  Hypokalemia This was repleted.  Normocytic anemia Drop in hemoglobin, likely dilutional. No evidence for overt bleeding. Anemia panel reviewed. Ferritin 208. Vitamin G-18 and folic acid level normal. Hemoglobin remains stable.   Probable gallstones noted incidentally on CT scan. LFTs were normal. Unlikely this is contributing to her current presentation. Repeat LFTs are normal. Will need ultrasound to better evaluate, but can be pursued as an outpatient. Stable at present  Morbid obesity Body mass index is 43.04 kg/m. Stable  Discharge Diagnoses:  Principal Problem:   Enterocolitis Active Problems:   Pyelonephritis   Sepsis ([place])   Obesity, Class III, BMI 40-49.9 (morbid obesity) ([place])    Discharge Instructions   Allergies as of 11/11/2016   No Known Allergies     Medication List    STOP taking these medications   meloxicam 15 MG tablet Commonly known as:  MOBIC     TAKE these medications   acidophilus Caps capsule Take 2 capsules by mouth daily.   ciprofloxacin 500 MG tablet Commonly known as:  CIPRO Take 1 tablet (500 mg total) by mouth 2 (two) times daily.   cyclobenzaprine 10 MG tablet Commonly known as:  FLEXERIL Take 1 tablet (10 mg total) by mouth 3 (three) times daily as needed for muscle spasms.   ketorolac 10 MG tablet Commonly known as:  TORADOL Take 1 tablet (10 mg total) by mouth every 6 (  six) hours as needed.   metroNIDAZOLE 500 MG tablet Commonly known as:  FLAGYL Take 1 tablet (500 mg total) by mouth every 8 (eight) hours.    ranitidine 150 MG tablet Commonly known as:  ZANTAC Take 1 tablet (150 mg total) by mouth 2 (two) times daily.   valsartan-hydrochlorothiazide 80-12.5 MG tablet Commonly known as:  DIOVAN-HCT Take 1 tablet by mouth daily.   VITAMIN D PO Take 1 tablet by mouth daily.            Discharge Care Instructions        Start     Ordered   11/12/16 0000  acidophilus (RISAQUAD) CAPS capsule  Daily     11/11/16 1244   11/11/16 0000  ranitidine (ZANTAC) 150 MG tablet  2 times daily     11/11/16 1244   11/11/16 0000  ciprofloxacin (CIPRO) 500 MG tablet  2 times daily     11/11/16 1246   11/11/16 0000  metroNIDAZOLE (FLAGYL) 500 MG tablet  Every 8 hours     11/11/16 1246   11/11/16 0000  ketorolac (TORADOL) 10 MG tablet  Every 6 hours PRN     11/11/16 1248     Follow-up Information    Glendale Chard, MD. Schedule an appointment as soon as possible for a visit in 1 week(s).   Specialty:  Internal Medicine Contact information: [address] [place] [address] [phone]          No Known Allergies   Procedures/Studies: Dg Chest 2 View  Result Date: 11/09/2016 CLINICAL DATA:  Shortness of breath; sepsis, enterocolitis, current smoker. EXAM: CHEST  2 VIEW COMPARISON:  Chest x-ray of November 05, 2016 FINDINGS: The lungs are hypoinflated. There are increased densities at both bases compatible with atelectasis or pneumonia. Small bilateral pleural effusions are present. The cardiac silhouette is enlarged. The pulmonary vascularity is mildly engorged. The trachea is midline. The bony thorax exhibits no acute abnormality. IMPRESSION: Bibasilar atelectasis or pneumonia. Small bilateral pleural effusions. CHF with mild pulmonary vascular congestion. Electronically Signed   By: David  Martinique M.D.   On: 11/09/2016 07:32   Ct Abdomen Pelvis W Contrast  Result Date: 11/05/2016 CLINICAL DATA:  Abdominal pain and distension. EXAM: CT ABDOMEN AND PELVIS WITH CONTRAST  TECHNIQUE: Multidetector CT imaging of the abdomen and pelvis was performed using the standard protocol following bolus administration of intravenous contrast. CONTRAST:  100 cc Isovue-300 IV COMPARISON:  Radiographs earlier this day. FINDINGS: Lower chest: Compressive atelectasis at the right lung base adjacent to mild eventration of right hemidiaphragm. No pleural fluid. No consolidation. Hepatobiliary: No focal hepatic lesion. Gallbladder physiologically distended. Layering hyperdensity likely combination of small stones and sludge. No biliary dilatation. Pancreas: No ductal dilatation or inflammation. Spleen: Normal in size without focal abnormality. Adrenals/Urinary Tract: No adrenal nodule. Homogeneous renal enhancement with symmetric excretion. No hydronephrosis or perinephric edema. Urinary bladder is physiologically distended, no bladder wall thickening. Stomach/Bowel: Stomach is nondistended. No bowel obstruction. Distal small bowel is fluid-filled with minimal mesenteric edema. The colon is fluid-filled, minimal pericolonic edema about the descending colon. There a few scattered colonic diverticular throughout the entire colon without diverticulitis. Normal appendix. Vascular/Lymphatic: No significant vascular findings are present. No enlarged abdominal or pelvic lymph nodes. Reproductive: Uterus is prominent size with soft tissue fullness in the region of the cervix per no discrete focal mass. Ovaries are symmetric in size. No adnexal mass. Other: No free air, free fluid, or intra-abdominal fluid collection. Musculoskeletal: There  are no acute or suspicious osseous abnormalities. IMPRESSION: 1. Fluid-filled large and small bowel with faint perienteric edema, suggesting mild enterocolitis. No obstruction. 2. Soft tissue fullness in the region of the cervix, may be incidental, recommend correlation with physical exam and up-to-date cervical cancer screening to exclude cervical mass. 3. Probable gallbladder  stones and sludge. No gallbladder inflammation. 4. Minimal colonic diverticulosis. Electronically Signed   By: Jeb Levering M.D.   On: 11/05/2016 03:48   Dg Abd Acute W/chest  Result Date: 11/05/2016 CLINICAL DATA:  Fever.  Abdominal pain and distention. EXAM: DG ABDOMEN ACUTE W/ 1V CHEST COMPARISON:  None. FINDINGS: The cardiomediastinal silhouette is within normal limits. The lungs are hypoinflated with minimal atelectasis in the right mid to lower lung and left lung base. No lobar consolidation, edema, pleural effusion, or pneumothorax is identified. There is no evidence of intraperitoneal free air. A small amount of gas is present in the stomach, and there is a small amount of gas in a single nondilated small bowel loop in the left mid abdomen. No significant bowel gas is seen elsewhere. No acute osseous abnormality is seen. IMPRESSION: 1. Mild hypoinflation without evidence of acute cardiopulmonary process. 2. Paucity of bowel gas, limiting assessment for obstruction. Electronically Signed   By: Logan Bores M.D.   On: 11/05/2016 02:20    Subjective: Eager to go home  Discharge Exam: Vitals:   11/10/16 2202 11/11/16 0512  BP: 134/90 116/72  Pulse: 66 77  Resp: 18 18  Temp: 98.4 F (36.9 C) 98.3 F (36.8 C)  SpO2: 99% 99%   Vitals:   11/10/16 0500 11/10/16 1300 11/10/16 2202 11/11/16 0512  BP: 124/70 (!) 146/94 134/90 116/72  Pulse: 74 76 66 77  Resp: 20 18 18 18   Temp: 99.2 F (37.3 C) 98 F (36.7 C) 98.4 F (36.9 C) 98.3 F (36.8 C)  TempSrc: Oral Oral Oral Oral  SpO2: 94% 100% 99% 99%  Weight:      Height:        General: Pt is alert, awake, not in acute distress Cardiovascular: RRR, S1/S2 +, no rubs, no gallops Respiratory: CTA bilaterally, no wheezing, no rhonchi Abdominal: Soft, NT, ND, bowel sounds + Extremities: no edema, no cyanosis   The results of significant diagnostics from this hospitalization (including imaging, microbiology, ancillary and  laboratory) are listed below for reference.     Microbiology: Recent Results (from the past 240 hour(s))  Blood culture (routine x 2)     Status: None   Collection Time: 11/05/16  1:01 AM  Result Value Ref Range Status   Specimen Description BLOOD BLOOD LEFT FOREARM  Final   Special Requests   Final    BOTTLES DRAWN AEROBIC AND ANAEROBIC Blood Culture adequate volume   Culture   Final    NO GROWTH 5 DAYS Performed at [facility], [address], [place], [place] [address]    Report Status 11/10/2016 FINAL  Final  Blood culture (routine x 2)     Status: None   Collection Time: 11/05/16  1:01 AM  Result Value Ref Range Status   Specimen Description BLOOD BLOOD RIGHT FOREARM  Final   Special Requests   Final    BOTTLES DRAWN AEROBIC AND ANAEROBIC Blood Culture adequate volume   Culture   Final    NO GROWTH 5 DAYS Performed at [facility], [place] [address], [place], [place] [address]    Report Status 11/10/2016 FINAL  Final  Urine  culture     Status: Abnormal   Collection Time: 11/05/16  1:22 AM  Result Value Ref Range Status   Specimen Description URINE, RANDOM  Final   Special Requests NONE  Final   Culture MULTIPLE SPECIES PRESENT, SUGGEST RECOLLECTION (A)  Final   Report Status 11/06/2016 FINAL  Final  MRSA PCR Screening     Status: None   Collection Time: 11/05/16  1:08 PM  Result Value Ref Range Status   MRSA by PCR NEGATIVE NEGATIVE Final    Comment:        The GeneXpert MRSA Assay (FDA approved for NASAL specimens only), is one component of a comprehensive MRSA colonization surveillance program. It is not intended to diagnose MRSA infection nor to guide or monitor treatment for MRSA infections.   Gastrointestinal Panel by PCR , Stool     Status: None   Collection Time: 11/06/16 10:57 AM  Result Value Ref Range Status   Campylobacter species NOT DETECTED NOT DETECTED Final   Plesimonas shigelloides NOT DETECTED NOT DETECTED Final   Salmonella  species NOT DETECTED NOT DETECTED Final   Yersinia enterocolitica NOT DETECTED NOT DETECTED Final   Vibrio species NOT DETECTED NOT DETECTED Final   Vibrio cholerae NOT DETECTED NOT DETECTED Final   Enteroaggregative E coli (EAEC) NOT DETECTED NOT DETECTED Final   Enteropathogenic E coli (EPEC) NOT DETECTED NOT DETECTED Final   Enterotoxigenic E coli (ETEC) NOT DETECTED NOT DETECTED Final   Shiga like toxin producing E coli (STEC) NOT DETECTED NOT DETECTED Final   Shigella/Enteroinvasive E coli (EIEC) NOT DETECTED NOT DETECTED Final   Cryptosporidium NOT DETECTED NOT DETECTED Final   Cyclospora cayetanensis NOT DETECTED NOT DETECTED Final   Entamoeba histolytica NOT DETECTED NOT DETECTED Final   Giardia lamblia NOT DETECTED NOT DETECTED Final   Adenovirus F40/41 NOT DETECTED NOT DETECTED Final   Astrovirus NOT DETECTED NOT DETECTED Final   Norovirus GI/GII NOT DETECTED NOT DETECTED Final   Rotavirus A NOT DETECTED NOT DETECTED Final   Sapovirus (I, II, IV, and V) NOT DETECTED NOT DETECTED Final  C difficile quick scan w PCR reflex     Status: None   Collection Time: 11/06/16 10:57 AM  Result Value Ref Range Status   C Diff antigen NEGATIVE NEGATIVE Final   C Diff toxin NEGATIVE NEGATIVE Final   C Diff interpretation No C. difficile detected.  Final     Labs: BNP (last 3 results) No results for input(s): BNP in the last 8760 hours. Basic Metabolic Panel:  Recent Labs Lab 11/07/16 0329 11/08/16 0532 11/09/16 0543 11/10/16 0521 11/11/16 0553  NA 137 137 140 138 137  K 3.9 3.9 3.9 3.5 3.1*  CL 113* 109 111 106 103  CO2 19* 21* 23 23 27   GLUCOSE 137* 117* 105* 99 111*  BUN 8 6 8 8 8   CREATININE 0.98 0.81 0.82 0.81 0.84  CALCIUM 7.8* 8.5* 8.5* 8.4* 8.5*   Liver Function Tests:  Recent Labs Lab 11/05/16 0100 11/06/16 1214 11/07/16 0329 11/09/16 0543  AST 23 23 18 18   ALT 15 15 14 17   ALKPHOS 49 55 53 77  BILITOT 1.2 0.9 1.1 0.9  PROT 6.9 6.6 6.5 6.1*  ALBUMIN 3.4*  2.7* 2.5* 2.2*    Recent Labs Lab 11/05/16 0100  LIPASE 20   No results for input(s): AMMONIA in the last 168 hours. CBC:  Recent Labs Lab 11/05/16 0100 11/06/16 0256 11/07/16 0329 11/08/16 0532 11/09/16 0543  WBC 18.1* 20.8*  17.5* 12.6* 9.5  NEUTROABS 16.9*  --   --   --   --   HGB 12.4 10.5* 9.8* 10.1* 9.7*  HCT 35.6* 30.6* 28.8* 30.2* 28.8*  MCV 98.1 100.0 100.3* 99.3 99.0  PLT 283 235 222 242 286   Cardiac Enzymes: No results for input(s): CKTOTAL, CKMB, CKMBINDEX, TROPONINI in the last 168 hours. BNP: Invalid input(s): POCBNP CBG:  Recent Labs Lab 11/06/16 1251  GLUCAP 91   D-Dimer No results for input(s): DDIMER in the last 72 hours. Hgb A1c No results for input(s): HGBA1C in the last 72 hours. Lipid Profile No results for input(s): CHOL, HDL, LDLCALC, TRIG, CHOLHDL, LDLDIRECT in the last 72 hours. Thyroid function studies No results for input(s): TSH, T4TOTAL, T3FREE, THYROIDAB in the last 72 hours.  Invalid input(s): FREET3 Anemia work up No results for input(s): VITAMINB12, FOLATE, FERRITIN, TIBC, IRON, RETICCTPCT in the last 72 hours. Urinalysis    Component Value Date/Time   COLORURINE YELLOW 11/05/2016 0101   APPEARANCEUR HAZY (A) 11/05/2016 0101   LABSPEC 1.013 11/05/2016 0101   PHURINE 5.0 11/05/2016 0101   GLUCOSEU NEGATIVE 11/05/2016 0101   HGBUR SMALL (A) 11/05/2016 0101   BILIRUBINUR NEGATIVE 11/05/2016 0101   KETONESUR 5 (A) 11/05/2016 0101   PROTEINUR 30 (A) 11/05/2016 0101   NITRITE NEGATIVE 11/05/2016 0101   LEUKOCYTESUR LARGE (A) 11/05/2016 0101   Sepsis Labs Invalid input(s): PROCALCITONIN,  WBC,  LACTICIDVEN Microbiology Recent Results (from the past 240 hour(s))  Blood culture (routine x 2)     Status: None   Collection Time: 11/05/16  1:01 AM  Result Value Ref Range Status   Specimen Description BLOOD BLOOD LEFT FOREARM  Final   Special Requests   Final    BOTTLES DRAWN AEROBIC AND ANAEROBIC Blood Culture adequate  volume   Culture   Final    NO GROWTH 5 DAYS Performed at [facility], [address], [place], [place] [address]    Report Status 11/10/2016 FINAL  Final  Blood culture (routine x 2)     Status: None   Collection Time: 11/05/16  1:01 AM  Result Value Ref Range Status   Specimen Description BLOOD BLOOD RIGHT FOREARM  Final   Special Requests   Final    BOTTLES DRAWN AEROBIC AND ANAEROBIC Blood Culture adequate volume   Culture   Final    NO GROWTH 5 DAYS Performed at [facility], [place] [address] [place], [place] [address]    Report Status 11/10/2016 FINAL  Final  Urine culture     Status: Abnormal   Collection Time: 11/05/16  1:22 AM  Result Value Ref Range Status   Specimen Description URINE, RANDOM  Final   Special Requests NONE  Final   Culture MULTIPLE SPECIES PRESENT, SUGGEST RECOLLECTION (A)  Final   Report Status 11/06/2016 FINAL  Final  MRSA PCR Screening     Status: None   Collection Time: 11/05/16  1:08 PM  Result Value Ref Range Status   MRSA by PCR NEGATIVE NEGATIVE Final    Comment:        The GeneXpert MRSA Assay (FDA approved for NASAL specimens only), is one component of a comprehensive MRSA colonization surveillance program. It is not intended to diagnose MRSA infection nor to guide or monitor treatment for MRSA infections.   Gastrointestinal Panel by PCR , Stool     Status: None   Collection Time: 11/06/16 10:57 AM  Result Value Ref Range Status  Campylobacter species NOT DETECTED NOT DETECTED Final   Plesimonas shigelloides NOT DETECTED NOT DETECTED Final   Salmonella species NOT DETECTED NOT DETECTED Final   Yersinia enterocolitica NOT DETECTED NOT DETECTED Final   Vibrio species NOT DETECTED NOT DETECTED Final   Vibrio cholerae NOT DETECTED NOT DETECTED Final   Enteroaggregative E coli (EAEC) NOT DETECTED NOT DETECTED Final   Enteropathogenic E coli (EPEC) NOT DETECTED NOT DETECTED Final   Enterotoxigenic E coli (ETEC) NOT DETECTED  NOT DETECTED Final   Shiga like toxin producing E coli (STEC) NOT DETECTED NOT DETECTED Final   Shigella/Enteroinvasive E coli (EIEC) NOT DETECTED NOT DETECTED Final   Cryptosporidium NOT DETECTED NOT DETECTED Final   Cyclospora cayetanensis NOT DETECTED NOT DETECTED Final   Entamoeba histolytica NOT DETECTED NOT DETECTED Final   Giardia lamblia NOT DETECTED NOT DETECTED Final   Adenovirus F40/41 NOT DETECTED NOT DETECTED Final   Astrovirus NOT DETECTED NOT DETECTED Final   Norovirus GI/GII NOT DETECTED NOT DETECTED Final   Rotavirus A NOT DETECTED NOT DETECTED Final   Sapovirus (I, II, IV, and V) NOT DETECTED NOT DETECTED Final  C difficile quick scan w PCR reflex     Status: None   Collection Time: 11/06/16 10:57 AM  Result Value Ref Range Status   C Diff antigen NEGATIVE NEGATIVE Final   C Diff toxin NEGATIVE NEGATIVE Final   C Diff interpretation No C. difficile detected.  Final     SIGNED:   Donne Hazel, MD  [facility] 11/11/2016, 7:04 PM  If 7PM-7AM, please contact night-coverage [email or web address] Password [email or web address]

## 2016-11-11 NOTE — Progress Notes (Signed)
Date: November 11, 2016 Chart reviewed for discharge orders: None found for case management. Vernia Buff, [phone]

## 2016-12-03 ENCOUNTER — Other Ambulatory Visit: Payer: Self-pay | Admitting: Nurse Practitioner

## 2016-12-03 DIAGNOSIS — K8080 Other cholelithiasis without obstruction: Secondary | ICD-10-CM

## 2016-12-23 ENCOUNTER — Ambulatory Visit
Admission: RE | Admit: 2016-12-23 | Discharge: 2016-12-23 | Disposition: A | Discharge status: Home / Self Care | Payer: BLUE CROSS/BLUE SHIELD | Source: Ambulatory Visit | Attending: Nurse Practitioner | Admitting: Nurse Practitioner

## 2016-12-23 DIAGNOSIS — K8080 Other cholelithiasis without obstruction: Secondary | ICD-10-CM

## 2017-03-02 ENCOUNTER — Other Ambulatory Visit: Payer: Self-pay | Admitting: Internal Medicine

## 2017-03-02 DIAGNOSIS — Z1231 Encounter for screening mammogram for malignant neoplasm of breast: Secondary | ICD-10-CM

## 2017-03-04 ENCOUNTER — Ambulatory Visit
Admission: RE | Admit: 2017-03-04 | Discharge: 2017-03-04 | Disposition: A | Discharge status: Home / Self Care | Payer: BLUE CROSS/BLUE SHIELD | Source: Ambulatory Visit | Attending: Internal Medicine | Admitting: Internal Medicine

## 2017-03-04 DIAGNOSIS — Z1231 Encounter for screening mammogram for malignant neoplasm of breast: Secondary | ICD-10-CM

## 2017-03-09 ENCOUNTER — Other Ambulatory Visit: Payer: Self-pay | Admitting: Internal Medicine

## 2017-03-09 DIAGNOSIS — R928 Other abnormal and inconclusive findings on diagnostic imaging of breast: Secondary | ICD-10-CM

## 2017-03-11 ENCOUNTER — Ambulatory Visit
Admission: RE | Admit: 2017-03-11 | Discharge: 2017-03-11 | Disposition: A | Discharge status: Home / Self Care | Payer: BLUE CROSS/BLUE SHIELD | Source: Ambulatory Visit | Attending: Internal Medicine | Admitting: Internal Medicine

## 2017-03-11 ENCOUNTER — Other Ambulatory Visit: Payer: Self-pay | Admitting: Internal Medicine

## 2017-03-11 DIAGNOSIS — N6489 Other specified disorders of breast: Secondary | ICD-10-CM

## 2017-03-11 DIAGNOSIS — R928 Other abnormal and inconclusive findings on diagnostic imaging of breast: Secondary | ICD-10-CM

## 2017-03-16 ENCOUNTER — Ambulatory Visit
Admission: RE | Admit: 2017-03-16 | Discharge: 2017-03-16 | Disposition: A | Discharge status: Home / Self Care | Payer: BLUE CROSS/BLUE SHIELD | Source: Ambulatory Visit | Attending: Internal Medicine | Admitting: Internal Medicine

## 2017-03-16 DIAGNOSIS — N6489 Other specified disorders of breast: Secondary | ICD-10-CM

## 2017-03-25 ENCOUNTER — Other Ambulatory Visit: Payer: Self-pay | Admitting: General Surgery

## 2017-03-25 DIAGNOSIS — N6489 Other specified disorders of breast: Secondary | ICD-10-CM

## 2017-03-29 ENCOUNTER — Other Ambulatory Visit: Payer: Self-pay | Admitting: General Surgery

## 2017-03-29 DIAGNOSIS — N6489 Other specified disorders of breast: Secondary | ICD-10-CM

## 2017-03-31 ENCOUNTER — Encounter (HOSPITAL_BASED_OUTPATIENT_CLINIC_OR_DEPARTMENT_OTHER): Payer: Self-pay | Admitting: *Deleted

## 2017-04-06 ENCOUNTER — Other Ambulatory Visit: Payer: Self-pay

## 2017-04-06 ENCOUNTER — Encounter (HOSPITAL_BASED_OUTPATIENT_CLINIC_OR_DEPARTMENT_OTHER)
Admission: RE | Admit: 2017-04-06 | Discharge: 2017-04-06 | Disposition: A | Discharge status: Home / Self Care | Payer: BLUE CROSS/BLUE SHIELD | Source: Ambulatory Visit | Attending: General Surgery | Admitting: General Surgery

## 2017-04-06 ENCOUNTER — Ambulatory Visit
Admission: RE | Admit: 2017-04-06 | Discharge: 2017-04-06 | Disposition: A | Discharge status: Home / Self Care | Payer: BLUE CROSS/BLUE SHIELD | Source: Ambulatory Visit | Attending: General Surgery | Admitting: General Surgery

## 2017-04-06 DIAGNOSIS — N6489 Other specified disorders of breast: Secondary | ICD-10-CM | POA: Diagnosis not present

## 2017-04-06 DIAGNOSIS — Z8249 Family history of ischemic heart disease and other diseases of the circulatory system: Secondary | ICD-10-CM | POA: Diagnosis not present

## 2017-04-06 DIAGNOSIS — I10 Essential (primary) hypertension: Secondary | ICD-10-CM | POA: Diagnosis not present

## 2017-04-06 DIAGNOSIS — N6091 Unspecified benign mammary dysplasia of right breast: Secondary | ICD-10-CM | POA: Diagnosis not present

## 2017-04-06 DIAGNOSIS — F17200 Nicotine dependence, unspecified, uncomplicated: Secondary | ICD-10-CM | POA: Diagnosis not present

## 2017-04-06 DIAGNOSIS — Z803 Family history of malignant neoplasm of breast: Secondary | ICD-10-CM | POA: Diagnosis not present

## 2017-04-06 DIAGNOSIS — N6021 Fibroadenosis of right breast: Secondary | ICD-10-CM | POA: Diagnosis not present

## 2017-04-06 DIAGNOSIS — Z6841 Body Mass Index (BMI) 40.0 and over, adult: Secondary | ICD-10-CM | POA: Diagnosis not present

## 2017-04-06 DIAGNOSIS — Z79899 Other long term (current) drug therapy: Secondary | ICD-10-CM | POA: Diagnosis not present

## 2017-04-06 DIAGNOSIS — K219 Gastro-esophageal reflux disease without esophagitis: Secondary | ICD-10-CM | POA: Diagnosis not present

## 2017-04-06 LAB — BASIC METABOLIC PANEL
Anion gap: 9 (ref 5–15)
BUN: 16 mg/dL (ref 6–20)
CALCIUM: 9.2 mg/dL (ref 8.9–10.3)
CHLORIDE: 107 mmol/L (ref 101–111)
CO2: 23 mmol/L (ref 22–32)
CREATININE: 0.99 mg/dL (ref 0.44–1.00)
GFR calc non Af Amer: >60 mL/min (ref >60)
Glucose, Bld: 102 mg/dL — ABNORMAL HIGH (ref 65–99)
Potassium: 4.4 mmol/L (ref 3.5–5.1)
Sodium: 139 mmol/L (ref 135–145)

## 2017-04-06 NOTE — Progress Notes (Addendum)
Pt given Ensure and instructed to drink 0845 day of surgery. Dr Lissa Hoard reviewed EKG and ok for surgery.

## 2017-04-07 ENCOUNTER — Ambulatory Visit (HOSPITAL_BASED_OUTPATIENT_CLINIC_OR_DEPARTMENT_OTHER)
Admission: RE | Admit: 2017-04-07 | Discharge: 2017-04-07 | Disposition: A | Discharge status: Home / Self Care | Payer: BLUE CROSS/BLUE SHIELD | Source: Ambulatory Visit | Attending: General Surgery | Admitting: General Surgery

## 2017-04-07 ENCOUNTER — Encounter (HOSPITAL_BASED_OUTPATIENT_CLINIC_OR_DEPARTMENT_OTHER)
Admission: RE | Disposition: A | Discharge status: Home / Self Care | Payer: Self-pay | Source: Ambulatory Visit | Attending: General Surgery

## 2017-04-07 ENCOUNTER — Encounter (HOSPITAL_BASED_OUTPATIENT_CLINIC_OR_DEPARTMENT_OTHER): Payer: Self-pay

## 2017-04-07 ENCOUNTER — Ambulatory Visit
Admission: RE | Admit: 2017-04-07 | Discharge: 2017-04-07 | Disposition: A | Discharge status: Home / Self Care | Payer: BLUE CROSS/BLUE SHIELD | Source: Ambulatory Visit | Attending: General Surgery | Admitting: General Surgery

## 2017-04-07 ENCOUNTER — Ambulatory Visit (HOSPITAL_BASED_OUTPATIENT_CLINIC_OR_DEPARTMENT_OTHER): Payer: BLUE CROSS/BLUE SHIELD | Admitting: Anesthesiology

## 2017-04-07 ENCOUNTER — Other Ambulatory Visit: Payer: Self-pay

## 2017-04-07 DIAGNOSIS — Z803 Family history of malignant neoplasm of breast: Secondary | ICD-10-CM | POA: Insufficient documentation

## 2017-04-07 DIAGNOSIS — K219 Gastro-esophageal reflux disease without esophagitis: Secondary | ICD-10-CM | POA: Insufficient documentation

## 2017-04-07 DIAGNOSIS — N6021 Fibroadenosis of right breast: Secondary | ICD-10-CM | POA: Insufficient documentation

## 2017-04-07 DIAGNOSIS — N6489 Other specified disorders of breast: Secondary | ICD-10-CM

## 2017-04-07 DIAGNOSIS — N6091 Unspecified benign mammary dysplasia of right breast: Secondary | ICD-10-CM | POA: Insufficient documentation

## 2017-04-07 DIAGNOSIS — Z6841 Body Mass Index (BMI) 40.0 and over, adult: Secondary | ICD-10-CM | POA: Insufficient documentation

## 2017-04-07 DIAGNOSIS — Z8249 Family history of ischemic heart disease and other diseases of the circulatory system: Secondary | ICD-10-CM | POA: Insufficient documentation

## 2017-04-07 DIAGNOSIS — F17200 Nicotine dependence, unspecified, uncomplicated: Secondary | ICD-10-CM | POA: Insufficient documentation

## 2017-04-07 DIAGNOSIS — Z79899 Other long term (current) drug therapy: Secondary | ICD-10-CM | POA: Insufficient documentation

## 2017-04-07 DIAGNOSIS — I10 Essential (primary) hypertension: Secondary | ICD-10-CM | POA: Insufficient documentation

## 2017-04-07 HISTORY — PX: RADIOACTIVE SEED GUIDED EXCISIONAL BREAST BIOPSY: SHX6490

## 2017-04-07 HISTORY — DX: Gastro-esophageal reflux disease without esophagitis: K21.9

## 2017-04-07 HISTORY — PX: BREAST EXCISIONAL BIOPSY: SUR124

## 2017-04-07 HISTORY — DX: Essential (primary) hypertension: I10

## 2017-04-07 SURGERY — RADIOACTIVE SEED GUIDED BREAST BIOPSY
Anesthesia: General | Site: Breast | Laterality: Right

## 2017-04-07 MED ORDER — ACETAMINOPHEN 500 MG PO TABS
ORAL_TABLET | ORAL | Status: AC
  Filled 2017-04-07: qty 2

## 2017-04-07 MED ORDER — PHENYLEPHRINE HCL 10 MG/ML IJ SOLN
INTRAMUSCULAR | Status: DC | PRN
  Administered 2017-04-07: 80 ug via INTRAVENOUS
  Administered 2017-04-07 (×2): 40 ug via INTRAVENOUS

## 2017-04-07 MED ORDER — SCOPOLAMINE 1 MG/3DAYS TD PT72: 1.0000 | MEDICATED_PATCH | Freq: Once | TRANSDERMAL | Status: DC | PRN

## 2017-04-07 MED ORDER — CEFAZOLIN SODIUM-DEXTROSE 2-4 GM/100ML-% IV SOLN
2.0000 g | INTRAVENOUS | Status: AC
  Administered 2017-04-07: 2 g via INTRAVENOUS

## 2017-04-07 MED ORDER — GABAPENTIN 300 MG PO CAPS
ORAL_CAPSULE | ORAL | Status: AC
  Filled 2017-04-07: qty 1

## 2017-04-07 MED ORDER — MIDAZOLAM HCL 2 MG/2ML IJ SOLN
INTRAMUSCULAR | Status: AC
  Filled 2017-04-07: qty 2

## 2017-04-07 MED ORDER — FENTANYL CITRATE (PF) 100 MCG/2ML IJ SOLN
INTRAMUSCULAR | Status: AC
  Filled 2017-04-07: qty 2

## 2017-04-07 MED ORDER — ONDANSETRON HCL 4 MG/2ML IJ SOLN
INTRAMUSCULAR | Status: DC | PRN
  Administered 2017-04-07: 4 mg via INTRAVENOUS

## 2017-04-07 MED ORDER — PROMETHAZINE HCL 25 MG/ML IJ SOLN: 6.2500 mg | INTRAMUSCULAR | Status: DC | PRN

## 2017-04-07 MED ORDER — PHENYLEPHRINE 40 MCG/ML (10ML) SYRINGE FOR IV PUSH (FOR BLOOD PRESSURE SUPPORT)
PREFILLED_SYRINGE | INTRAVENOUS | Status: AC
  Filled 2017-04-07: qty 10

## 2017-04-07 MED ORDER — EPHEDRINE SULFATE 50 MG/ML IJ SOLN
INTRAMUSCULAR | Status: DC | PRN
  Administered 2017-04-07: 15 mg via INTRAVENOUS
  Administered 2017-04-07: 10 mg via INTRAVENOUS

## 2017-04-07 MED ORDER — FENTANYL CITRATE (PF) 100 MCG/2ML IJ SOLN: 25.0000 ug | INTRAMUSCULAR | Status: DC | PRN

## 2017-04-07 MED ORDER — GABAPENTIN 300 MG PO CAPS
300.0000 mg | ORAL_CAPSULE | ORAL | Status: AC
  Administered 2017-04-07: 300 mg via ORAL

## 2017-04-07 MED ORDER — CELECOXIB 400 MG PO CAPS
400.0000 mg | ORAL_CAPSULE | ORAL | Status: AC
  Administered 2017-04-07: 400 mg via ORAL

## 2017-04-07 MED ORDER — BUPIVACAINE HCL (PF) 0.25 % IJ SOLN
INTRAMUSCULAR | Status: DC | PRN
Start: 2017-04-07 — End: 2017-04-07
  Administered 2017-04-07: 20 mL

## 2017-04-07 MED ORDER — LIDOCAINE 2% (20 MG/ML) 5 ML SYRINGE
INTRAMUSCULAR | Status: DC | PRN
  Administered 2017-04-07: 50 mg via INTRAVENOUS

## 2017-04-07 MED ORDER — PROPOFOL 10 MG/ML IV BOLUS
INTRAVENOUS | Status: DC | PRN
  Administered 2017-04-07: 200 mg via INTRAVENOUS

## 2017-04-07 MED ORDER — LACTATED RINGERS IV SOLN
INTRAVENOUS | Status: DC
  Administered 2017-04-07: 10:00:00 via INTRAVENOUS

## 2017-04-07 MED ORDER — DEXAMETHASONE SODIUM PHOSPHATE 4 MG/ML IJ SOLN
INTRAMUSCULAR | Status: DC | PRN
  Administered 2017-04-07: 10 mg via INTRAVENOUS

## 2017-04-07 MED ORDER — EPHEDRINE 5 MG/ML INJ
INTRAVENOUS | Status: AC
  Filled 2017-04-07: qty 10

## 2017-04-07 MED ORDER — CEFAZOLIN SODIUM-DEXTROSE 2-4 GM/100ML-% IV SOLN
INTRAVENOUS | Status: AC
  Filled 2017-04-07: qty 100

## 2017-04-07 MED ORDER — ACETAMINOPHEN 500 MG PO TABS
1000.0000 mg | ORAL_TABLET | ORAL | Status: AC
  Administered 2017-04-07: 1000 mg via ORAL

## 2017-04-07 MED ORDER — CELECOXIB 200 MG PO CAPS
ORAL_CAPSULE | ORAL | Status: AC
  Filled 2017-04-07: qty 2

## 2017-04-07 MED ORDER — MIDAZOLAM HCL 2 MG/2ML IJ SOLN
1.0000 mg | INTRAMUSCULAR | Status: DC | PRN
  Administered 2017-04-07: 2 mg via INTRAVENOUS

## 2017-04-07 MED ORDER — FENTANYL CITRATE (PF) 100 MCG/2ML IJ SOLN
50.0000 ug | INTRAMUSCULAR | Status: DC | PRN
  Administered 2017-04-07: 100 ug via INTRAVENOUS

## 2017-04-07 MED ORDER — ENSURE PRE-SURGERY PO LIQD: 592.0000 mL | Freq: Once | ORAL | Status: DC

## 2017-04-07 SURGICAL SUPPLY — 64 items
ADH SKN CLS APL DERMABOND .7 (GAUZE/BANDAGES/DRESSINGS) ×1
APPLIER CLIP 9.375 MED OPEN (MISCELLANEOUS)
APR CLP MED 9.3 20 MLT OPN (MISCELLANEOUS)
BINDER BREAST LRG (GAUZE/BANDAGES/DRESSINGS) IMPLANT
BINDER BREAST MEDIUM (GAUZE/BANDAGES/DRESSINGS) IMPLANT
BINDER BREAST XLRG (GAUZE/BANDAGES/DRESSINGS) IMPLANT
BINDER BREAST XXLRG (GAUZE/BANDAGES/DRESSINGS) ×2 IMPLANT
BLADE SURG 15 STRL LF DISP TIS (BLADE) ×1 IMPLANT
BLADE SURG 15 STRL SS (BLADE) ×3
CANISTER SUC SOCK COL 7IN (MISCELLANEOUS) IMPLANT
CANISTER SUCT 1200ML W/VALVE (MISCELLANEOUS) IMPLANT
CHLORAPREP W/TINT 26ML (MISCELLANEOUS) ×3 IMPLANT
CLIP APPLIE 9.375 MED OPEN (MISCELLANEOUS) IMPLANT
CLIP VESOCCLUDE SM WIDE 6/CT (CLIP) ×2 IMPLANT
CLOSURE WOUND 1/2 X4 (GAUZE/BANDAGES/DRESSINGS) ×1
COVER BACK TABLE 60X90IN (DRAPES) ×3 IMPLANT
COVER MAYO STAND STRL (DRAPES) ×3 IMPLANT
COVER PROBE W GEL 5X96 (DRAPES) ×3 IMPLANT
DECANTER SPIKE VIAL GLASS SM (MISCELLANEOUS) IMPLANT
DERMABOND ADVANCED (GAUZE/BANDAGES/DRESSINGS) ×2
DERMABOND ADVANCED .7 DNX12 (GAUZE/BANDAGES/DRESSINGS) ×1 IMPLANT
DEVICE DUBIN W/COMP PLATE 8390 (MISCELLANEOUS) ×3 IMPLANT
DRAPE LAPAROSCOPIC ABDOMINAL (DRAPES) ×3 IMPLANT
DRAPE UTILITY XL STRL (DRAPES) ×3 IMPLANT
DRSG TEGADERM 4X4.75 (GAUZE/BANDAGES/DRESSINGS) IMPLANT
ELECT BLADE 4.0 EZ CLEAN MEGAD (MISCELLANEOUS) ×3
ELECT COATED BLADE 2.86 ST (ELECTRODE) ×3 IMPLANT
ELECT REM PT RETURN 9FT ADLT (ELECTROSURGICAL) ×3
ELECTRODE BLDE 4.0 EZ CLN MEGD (MISCELLANEOUS) IMPLANT
ELECTRODE REM PT RTRN 9FT ADLT (ELECTROSURGICAL) ×1 IMPLANT
GAUZE SPONGE 4X4 12PLY STRL LF (GAUZE/BANDAGES/DRESSINGS) IMPLANT
GLOVE BIO SURGEON STRL SZ7 (GLOVE) ×8 IMPLANT
GLOVE BIOGEL PI IND STRL 7.0 (GLOVE) IMPLANT
GLOVE BIOGEL PI IND STRL 7.5 (GLOVE) ×1 IMPLANT
GLOVE BIOGEL PI INDICATOR 7.0 (GLOVE) ×4
GLOVE BIOGEL PI INDICATOR 7.5 (GLOVE) ×2
GLOVE ECLIPSE 7.0 STRL STRAW (GLOVE) ×2 IMPLANT
GOWN STRL REUS W/ TWL LRG LVL3 (GOWN DISPOSABLE) ×2 IMPLANT
GOWN STRL REUS W/TWL LRG LVL3 (GOWN DISPOSABLE) ×9
HEMOSTAT ARISTA ABSORB 3G PWDR (MISCELLANEOUS) IMPLANT
ILLUMINATOR WAVEGUIDE N/F (MISCELLANEOUS) IMPLANT
KIT MARKER MARGIN INK (KITS) ×3 IMPLANT
LIGHT WAVEGUIDE WIDE FLAT (MISCELLANEOUS) IMPLANT
NDL HYPO 25X1 1.5 SAFETY (NEEDLE) ×1 IMPLANT
NEEDLE HYPO 25X1 1.5 SAFETY (NEEDLE) ×3 IMPLANT
NS IRRIG 1000ML POUR BTL (IV SOLUTION) IMPLANT
PACK BASIN DAY SURGERY FS (CUSTOM PROCEDURE TRAY) ×3 IMPLANT
PENCIL BUTTON HOLSTER BLD 10FT (ELECTRODE) ×3 IMPLANT
SLEEVE SCD COMPRESS KNEE MED (MISCELLANEOUS) ×3 IMPLANT
SPONGE LAP 4X18 X RAY DECT (DISPOSABLE) ×3 IMPLANT
STRIP CLOSURE SKIN 1/2X4 (GAUZE/BANDAGES/DRESSINGS) ×2 IMPLANT
SUT MNCRL AB 4-0 PS2 18 (SUTURE) IMPLANT
SUT MON AB 5-0 PS2 18 (SUTURE) IMPLANT
SUT SILK 2 0 SH (SUTURE) IMPLANT
SUT VIC AB 2-0 SH 27 (SUTURE) ×3
SUT VIC AB 2-0 SH 27XBRD (SUTURE) ×1 IMPLANT
SUT VIC AB 3-0 SH 27 (SUTURE) ×3
SUT VIC AB 3-0 SH 27X BRD (SUTURE) ×1 IMPLANT
SYR CONTROL 10ML LL (SYRINGE) ×3 IMPLANT
TOWEL OR 17X24 6PK STRL BLUE (TOWEL DISPOSABLE) ×3 IMPLANT
TOWEL OR NON WOVEN STRL DISP B (DISPOSABLE) ×3 IMPLANT
TUBE CONNECTING 20'X1/4 (TUBING)
TUBE CONNECTING 20X1/4 (TUBING) IMPLANT
YANKAUER SUCT BULB TIP NO VENT (SUCTIONS) IMPLANT

## 2017-04-07 NOTE — Anesthesia Procedure Notes (Signed)
Procedure Name: LMA Insertion Performed by: Olinda Nola W, CRNA Pre-anesthesia Checklist: Patient identified, Emergency Drugs available, Suction available and Patient being monitored Patient Re-evaluated:Patient Re-evaluated prior to induction Oxygen Delivery Method: Circle system utilized Preoxygenation: Pre-oxygenation with 100% oxygen Induction Type: IV induction Ventilation: Mask ventilation without difficulty LMA: LMA inserted LMA Size: 4.0 Number of attempts: 1 Placement Confirmation: positive ETCO2 Tube secured with: Tape Dental Injury: Teeth and Oropharynx as per pre-operative assessment        

## 2017-04-07 NOTE — Transfer of Care (Signed)
Immediate Anesthesia Transfer of Care Note  Patient: Jessica Rodgers  Procedure(s) Performed: RIGHT RADIOACTIVE SEED GUIDED EXCISIONAL BREAST BIOPSY (Right Breast)  Patient Location: PACU  Anesthesia Type:General  Level of Consciousness: awake, alert  and oriented  Airway & Oxygen Therapy: Patient Spontanous Breathing and Patient connected to face mask oxygen  Post-op Assessment: Report given to RN and Post -op Vital signs reviewed and stable  Post vital signs: Reviewed and stable  Last Vitals:  Vitals:   04/07/17 0945  BP: 113/72  Pulse: 86  Resp: 18  Temp: 37.2 C  SpO2: 100%    Last Pain:  Vitals:   04/07/17 0945  TempSrc: Oral         Complications: No apparent anesthesia complications

## 2017-04-07 NOTE — H&P (Signed)
42 yof referred by Dr Baird Cancer for right breast mass on screening mm. she has fh in mgm in 32s. she has no prior breast history. she does not have mass or dc now. underwent mm with c density breasts. she has a uoq persistent oval mass and an architectural distortion in the superior right breast posteriorly. US of the mass shows a simple cyst. there is no sonographic correlate to the distortion. there is no axillary adenopathy. she underwent stereo biopsy the the distortion and this is a csl with pash. she is here to discuss options  Past Surgical History Alean Rinne, [place]; 03/25/2017 9:21 AM) No pertinent past surgical history   Diagnostic Studies History Alean Rinne, [place]; 03/25/2017 9:21 AM) Colonoscopy  never Mammogram  within last year Pap Smear  1-5 years ago  Allergies Alean Rinne, RMA; 03/25/2017 9:23 AM) No Known Drug Allergies [03/25/2017]: Allergies Reconciled   Medication History Alean Rinne, [place]; 03/25/2017 9:24 AM) Ciprofloxacin HCl (500MG  Tablet, Oral) Active. Fluconazole (150MG  Tablet, Oral) Active. Valsartan-Hydrochlorothiazide (80-12.5MG  Tablet, Oral) Active. RaNITidine HCl (150MG  Tablet, Oral) Active. Medications Reconciled  Social History Alean Rinne, [place]; 03/25/2017 9:21 AM) Alcohol use  Moderate alcohol use. Caffeine use  Coffee. Illicit drug use  Remotely quit drug use. Tobacco use  Current every day smoker.  Family History Alean Rinne, [place]; 03/25/2017 9:21 AM) Arthritis  Mother. Breast Cancer  Family Members In General. Heart Disease  Mother. Hypertension  Mother.  Pregnancy / Birth History Alean Rinne, [place]; 03/25/2017 9:21 AM) Age at menarche  70 years. Gravida  3 Length (months) of breastfeeding  7-12 Maternal age  65-20 Para  3 Regular periods   Other Problems Alean Rinne, [place]; 03/25/2017 9:21 AM) Cholelithiasis  High blood pressure   Review of Systems Alean Rinne RMA; 03/25/2017 9:21  AM) General Not Present- Appetite Loss, Chills, Fatigue, Fever, Night Sweats, Weight Gain and Weight Loss. Skin Not Present- Change in Wart/Mole, Dryness, Hives, Jaundice, New Lesions, Non-Healing Wounds, Rash and Ulcer. HEENT Present- Ringing in the Ears. Not Present- Earache, Hearing Loss, Hoarseness, Nose Bleed, Oral Ulcers, Seasonal Allergies, Sinus Pain, Sore Throat, Visual Disturbances, Wears glasses/contact lenses and Yellow Eyes. Breast Present- Breast Mass. Not Present- Breast Pain, Nipple Discharge and Skin Changes. Cardiovascular Not Present- Chest Pain, Difficulty Breathing Lying Down, Leg Cramps, Palpitations, Rapid Heart Rate, Shortness of Breath and Swelling of Extremities. Gastrointestinal Not Present- Abdominal Pain, Bloating, Bloody Stool, Change in Bowel Habits, Chronic diarrhea, Constipation, Difficulty Swallowing, Excessive gas, Gets full quickly at meals, Hemorrhoids, Indigestion, Nausea, Rectal Pain and Vomiting. Female Genitourinary Not Present- Frequency, Nocturia, Painful Urination, Pelvic Pain and Urgency. Musculoskeletal Not Present- Back Pain, Joint Pain, Joint Stiffness, Muscle Pain, Muscle Weakness and Swelling of Extremities. Neurological Not Present- Decreased Memory, Fainting, Headaches, Numbness, Seizures, Tingling, Tremor, Trouble walking and Weakness. Psychiatric Not Present- Anxiety, Bipolar, Change in Sleep Pattern, Depression, Fearful and Frequent crying. Endocrine Not Present- Cold Intolerance, Excessive Hunger, Hair Changes, Heat Intolerance, Hot flashes and New Diabetes. Hematology Not Present- Blood Thinners, Easy Bruising, Excessive bleeding, Gland problems, HIV and Persistent Infections.  Vitals Mardene Celeste King RMA; 03/25/2017 9:23 AM) 03/25/2017 9:22 AM Weight: 230.6 lb Height: 63in Body Surface Area: 2.05 m Body Mass Index: 40.85 kg/m  Temp.: 97.80F  BP: 135/78 (Sitting, Left Arm, Standard) Physical Exam Rolm Bookbinder MD; 03/25/2017  9:51 AM) General Mental Status-Alert. Orientation-Oriented X3. Head and Neck Trachea-midline. Thyroid Gland Characteristics - normal size and consistency. Eye Sclera/Conjunctiva - Bilateral-No scleral icterus. Chest and Lung Exam Chest and  lung exam reveals -quiet, even and easy respiratory effort with no use of accessory muscles and on auscultation, normal breath sounds, no adventitious sounds and normal vocal resonance. Breast Nipples-No Discharge. No breast mass Cardiovascular Cardiovascular examination reveals -normal heart sounds, regular rate and rhythm with no murmurs. Lymphatic Head & Neck General Head & Neck Lymphatics: Bilateral - Description - Normal. Axillary General Axillary Region: Bilateral - Description - Normal. Note: no [place] adenopathy   Assessment & Plan Rolm Bookbinder MD; 03/25/2017 9:52 AM) RADIAL SCAR OF BREAST (N64.89) Story: right breast seed guided excisional biopsy we discussed core biopsy findings. we discussed option of 6 month follow up vs excision. discussed standard recommendation of excision due to upgrade rate for csl at this time. she is agreeable to that. discussed seed guided excision with risks and recovery.

## 2017-04-07 NOTE — Anesthesia Preprocedure Evaluation (Addendum)
Anesthesia Evaluation  Patient identified by MRN, date of birth, ID band Patient awake    Reviewed: Allergy & Precautions, NPO status , Patient's Chart, lab work & pertinent test results  Airway Mallampati: II  TM Distance: >3 FB Neck ROM: Full    Dental  (+) Teeth Intact, Dental Advisory Given   Pulmonary Current Smoker,    Pulmonary exam normal breath sounds clear to auscultation       Cardiovascular hypertension, Pt. on medications Normal cardiovascular exam Rhythm:Regular Rate:Normal     Neuro/Psych negative neurological ROS  negative psych ROS   GI/Hepatic Neg liver ROS, GERD  Medicated,  Endo/Other  Morbid obesity  Renal/GU negative Renal ROS     Musculoskeletal negative musculoskeletal ROS (+)   Abdominal   Peds  Hematology negative hematology ROS (+)   Anesthesia Other Findings Day of surgery medications reviewed with the patient.  Right breast biopsy  Reproductive/Obstetrics                             Anesthesia Physical Anesthesia Plan  ASA: II  Anesthesia Plan: General   Post-op Pain Management:    Induction: Intravenous  PONV Risk Score and Plan: 2 and Midazolam, Dexamethasone and Ondansetron  Airway Management Planned: LMA  Additional Equipment:   Intra-op Plan:   Post-operative Plan: Extubation in OR  Informed Consent: I have reviewed the patients History and Physical, chart, labs and discussed the procedure including the risks, benefits and alternatives for the proposed anesthesia with the patient or authorized representative who has indicated his/her understanding and acceptance.   Dental advisory given  Plan Discussed with: CRNA  Anesthesia Plan Comments: (Risks/benefits of general anesthesia discussed with patient including risk of damage to teeth, lips, gum, and tongue, nausea/vomiting, allergic reactions to medications, and the possibility of heart  attack, stroke and death.  All patient questions answered.  Patient wishes to proceed.)        Anesthesia Quick Evaluation

## 2017-04-07 NOTE — Discharge Instructions (Signed)
[facility] °Office Phone Number [phone] °POST OP INSTRUCTIONS ° °Always review your discharge instruction sheet given to you by the facility where your surgery was performed. ° °IF YOU HAVE DISABILITY OR FAMILY LEAVE FORMS, YOU MUST BRING THEM TO THE OFFICE FOR PROCESSING.  DO NOT GIVE THEM TO YOUR DOCTOR. ° °1. A prescription for pain medication may be given to you upon discharge.  Take your pain medication as prescribed, if needed.  If narcotic pain medicine is not needed, then you may take acetaminophen (Tylenol), naprosyn (Alleve) or ibuprofen (Advil) as needed. °2. Take your usually prescribed medications unless otherwise directed °3. If you need a refill on your pain medication, please contact your pharmacy.  They will contact our office to request authorization.  Prescriptions will not be filled after 5pm or on week-ends. °4. You should eat very light the first 24 hours after surgery, such as soup, crackers, pudding, etc.  Resume your normal diet the day after surgery. °5. Most patients will experience some swelling and bruising in the breast.  Ice packs and a good support bra will help.  Wear the breast binder provided or a sports bra for 72 hours day and night.  After that wear a sports bra during the day until you return to the office. Swelling and bruising can take several days to resolve.  °6. It is common to experience some constipation if taking pain medication after surgery.  Increasing fluid intake and taking a stool softener will usually help or prevent this problem from occurring.  A mild laxative (Milk of Magnesia or Miralax) should be taken according to package directions if there are no bowel movements after 48 hours. °7. Unless discharge instructions indicate otherwise, you may remove your bandages 48 hours after surgery and you may shower at that time.  You may have steri-strips (small skin tapes) in place directly over the incision.  These strips should be left on the  skin for 7-10 days and will come off on their own.  If your surgeon used skin glue on the incision, you may shower in 24 hours.  The glue will flake off over the next 2-3 weeks.  Any sutures or staples will be removed at the office during your follow-up visit. °8. ACTIVITIES:  You may resume regular daily activities (gradually increasing) beginning the next day.  Wearing a good support bra or sports bra minimizes pain and swelling.  You may have sexual intercourse when it is comfortable. °a. You may drive when you no longer are taking prescription pain medication, you can comfortably wear a seatbelt, and you can safely maneuver your car and apply brakes. °b. RETURN TO WORK:  ______________________________________________________________________________________ °9. You should see your doctor in the office for a follow-up appointment approximately two weeks after your surgery.  Your doctor’s nurse will typically make your follow-up appointment when she calls you with your pathology report.  Expect your pathology report 3-4 business days after your surgery.  You may call to check if you do not hear from us after three days. °10. OTHER INSTRUCTIONS: _______________________________________________________________________________________________ _____________________________________________________________________________________________________________________________________ °_____________________________________________________________________________________________________________________________________ °_____________________________________________________________________________________________________________________________________ ° °WHEN TO CALL DR Sabra Sessler: °1. Fever over 101.0 °2. Nausea and/or vomiting. °3. Extreme swelling or bruising. °4. Continued bleeding from incision. °5. Increased pain, redness, or drainage from the incision. ° °The clinic staff is available to answer your questions during regular  business hours.  Please don’t hesitate to call and ask to speak to one of the nurses for clinical concerns.  If you   have a medical emergency, go to the nearest emergency room or call 911.  A surgeon from [facility] is always on call at the hospital. ° °For further questions, please visit [email or web address] ° °

## 2017-04-07 NOTE — Interval H&P Note (Signed)
History and Physical Interval Note:  04/07/2017 10:12 AM  Jessica Rodgers  has presented today for surgery, with the diagnosis of right breast mass  The various methods of treatment have been discussed with the patient and family. After consideration of risks, benefits and other options for treatment, the patient has consented to  Procedure(s): RIGHT RADIOACTIVE SEED GUIDED EXCISIONAL BREAST BIOPSY ERAS PATHWAY (Right) as a surgical intervention .  The patient's history has been reviewed, patient examined, no change in status, stable for surgery.  I have reviewed the patient's chart and labs.  Questions were answered to the patient's satisfaction.     Rolm Bookbinder

## 2017-04-07 NOTE — Anesthesia Postprocedure Evaluation (Signed)
Anesthesia Post Note  Patient: Jessica Rodgers  Procedure(s) Performed: RIGHT RADIOACTIVE SEED GUIDED EXCISIONAL BREAST BIOPSY (Right Breast)     Patient location during evaluation: PACU Anesthesia Type: General Level of consciousness: awake and alert Pain management: pain level controlled Vital Signs Assessment: post-procedure vital signs reviewed and stable Respiratory status: spontaneous breathing, nonlabored ventilation and respiratory function stable Cardiovascular status: blood pressure returned to baseline and stable Postop Assessment: no apparent nausea or vomiting Anesthetic complications: no    Last Vitals:  Vitals:   04/07/17 1145 04/07/17 1200  BP: 117/78 123/76  Pulse: 96 96  Resp: 16 19  Temp:    SpO2: 100% 97%    Last Pain:  Vitals:   04/07/17 1200  TempSrc:   PainSc: 0-No pain                 Catalina Gravel

## 2017-04-07 NOTE — Op Note (Signed)
Preoperative diagnosesright breast distortion with benign core biopsy Postoperative diagnosis: Same as above Procedure:Rightbreast seed guided excisional biopsy Surgeon: Dr. Serita Grammes Anesthesia: Gen. Estimated blood loss: minimal Complications: None Drains: None Specimens:Rightbreast tissue marked with paint Sponge and needle count correct at completion Disposition to recovery stable  Indications: This is a 73 yof with a right breast distortion and benign core biopsy.  She was referred for consideration of excision. We have discussed options and have elected to proceed with seed guided excision.  Procedure: After informed consent was obtained she was then taken to the operating room. She was given antibiotics.Sequential compression devices were on her legs. She was placed under general anesthesia without complication. Her chestwas then prepped and draped in the standard sterile surgical fashion. A surgical timeout was then performed.   The seed was in the upper rightbreast.I infiltrated marcaine and made a periareolar incision to hide the scar. I used the lighted retractor system to tunnel to the lesion. I then used the neoprobe to guide excision of the seedand surrounding tissue.Mammogram confirmed removal of seed and the clip.This was then all sent to pathology. Hemostasis was observed. I closed the breast tissue with a 2-0 Vicryl. The dermis was closed with 3-0 Vicryl and the skin with 5-0 Monocryl.Dermabond and steristrips were placed on the incision. A breast binder was placed. She was transferred to recovery stable

## 2017-04-08 ENCOUNTER — Encounter (HOSPITAL_BASED_OUTPATIENT_CLINIC_OR_DEPARTMENT_OTHER): Payer: Self-pay | Admitting: General Surgery

## 2017-09-19 DIAGNOSIS — N390 Urinary tract infection, site not specified: Secondary | ICD-10-CM | POA: Insufficient documentation

## 2017-10-31 DIAGNOSIS — N926 Irregular menstruation, unspecified: Secondary | ICD-10-CM | POA: Insufficient documentation

## 2017-10-31 DIAGNOSIS — F17200 Nicotine dependence, unspecified, uncomplicated: Secondary | ICD-10-CM | POA: Insufficient documentation

## 2017-10-31 DIAGNOSIS — E669 Obesity, unspecified: Secondary | ICD-10-CM | POA: Insufficient documentation

## 2017-11-01 LAB — BASIC METABOLIC PANEL
BUN: 10 (ref 4–21)
Creatinine: 1 (ref 0.5–1.1)
Glucose: 98
Potassium: 3.6 (ref 3.4–5.3)
SODIUM: 138 (ref 137–147)

## 2017-11-01 LAB — LIPID PANEL
CHOLESTEROL: 211 — AB (ref 0–200)
HDL: 83 — AB (ref 35–70)
LDL CALC: 114
LDL/HDL RATIO: 1.4
TRIGLYCERIDES: 114 (ref 40–160)

## 2017-11-01 LAB — VITAMIN D 25 HYDROXY (VIT D DEFICIENCY, FRACTURES): Vit D, 25-Hydroxy: 44.7

## 2017-11-01 LAB — HEPATIC FUNCTION PANEL
ALK PHOS: 67 (ref 25–125)
ALT: 16 (ref 7–35)
AST: 20 (ref 13–35)
BILIRUBIN, TOTAL: 0.4

## 2017-11-01 LAB — HEMOGLOBIN A1C: Hemoglobin A1C: 5

## 2017-12-24 ENCOUNTER — Encounter: Payer: Self-pay | Admitting: Internal Medicine

## 2017-12-24 DIAGNOSIS — N390 Urinary tract infection, site not specified: Secondary | ICD-10-CM

## 2017-12-26 ENCOUNTER — Telehealth: Payer: Self-pay | Admitting: Internal Medicine

## 2017-12-26 ENCOUNTER — Encounter: Payer: Self-pay | Admitting: Internal Medicine

## 2017-12-26 ENCOUNTER — Ambulatory Visit (INDEPENDENT_AMBULATORY_CARE_PROVIDER_SITE_OTHER): Payer: BLUE CROSS/BLUE SHIELD | Admitting: Internal Medicine

## 2017-12-26 VITALS — BP 126/74 | HR 99 | Temp 98.6°F | Ht 62.25 in | Wt 199.0 lb

## 2017-12-26 DIAGNOSIS — I10 Essential (primary) hypertension: Secondary | ICD-10-CM

## 2017-12-26 DIAGNOSIS — E669 Obesity, unspecified: Secondary | ICD-10-CM

## 2017-12-26 DIAGNOSIS — Z6836 Body mass index (BMI) 36.0-36.9, adult: Secondary | ICD-10-CM | POA: Diagnosis not present

## 2017-12-26 NOTE — Progress Notes (Signed)
  Subjective:     Patient ID: Jessica Rodgers , female    DOB: 1975-08-23 , 42 y.o.   MRN: [id number]   Hypertension  This is a chronic problem. The current episode started more than 1 year ago. The problem is controlled. Past treatments include lifestyle changes. The current treatment provides significant improvement. There are no compliance problems.  There is no history of kidney disease or retinopathy.     Past Medical History:  Diagnosis Date  . GERD (gastroesophageal reflux disease)   . Hypertension       Current Outpatient Medications:  .  Cholecalciferol (VITAMIN D PO), Take 1 tablet by mouth daily. , Disp: , Rfl:  .  Phentermine-Topiramate (QSYMIA) 11.25-69 MG CP24, Take by mouth., Disp: , Rfl:  .  ranitidine (ZANTAC) 150 MG tablet, Take 1 tablet (150 mg total) by mouth 2 (two) times daily., Disp: 60 tablet, Rfl: 0 .  valsartan-hydrochlorothiazide (DIOVAN-HCT) 80-12.5 MG tablet, Take 1 tablet by mouth daily., Disp: , Rfl: 1   No Known Allergies   Review of Systems  Constitutional: Negative.   HENT: Negative.   Eyes: Negative.   Respiratory: Negative.   Cardiovascular: Negative.   Gastrointestinal: Negative.   Neurological: Negative.   Psychiatric/Behavioral: Negative.      Today's Vitals   12/26/17 0932  BP: 126/74  Pulse: 99  Temp: 98.6 F (37 C)  Weight: 199 lb (90.3 kg)  Height: 5' 2.25" (1.581 m)   Body mass index is 36.11 kg/m.   Objective:  Physical Exam  Constitutional: She appears well-developed and well-nourished.  HENT:  Head: Normocephalic and atraumatic.  Eyes: Pupils are equal, round, and reactive to light. EOM are normal.  Neck: Normal range of motion.  Cardiovascular: Normal rate and regular rhythm.  Pulmonary/Chest: Effort normal and breath sounds normal.  Skin: Skin is warm.  Psychiatric: She has a normal mood and affect.  Nursing note and vitals reviewed.       Assessment And Plan:     Essential hypertension, benign - WELL  CONTROLLED. SHE WILL CONTINUE WITH CURRENT MEDS. SHE IS ENCOURAGED TO LIMIT HER SALT INTAKE.   Adult BMI 36.0-36.9 kg/sq m - I WILL SEND IN REFILL FOR QSYMIA. SHE WILL CONTINUE WITH 11.25MG  DOSAGE. SHE WILL RTO IN 10 WEEKS FOR RE-EVALUATION.   Obesity (BMI 35.0-39.9 without comorbidity) - SHE HAS LOST 10 POUNDS SINCE HER LAST VISIT. SHE WAS CONGRATULATED ON HER WEIGHT LOSS THUS FAR AND ENCOURAGED TO KEEP UP HER EFFORTS.      Maximino Greenland, MD

## 2017-12-26 NOTE — Patient Instructions (Signed)
Obesity, Adult Obesity is having too much body fat. If you have a BMI of 30 or more, you are obese. BMI is a number that explains how much body fat you have. Obesity is often caused by taking in (consuming) more calories than your body uses. Obesity can cause serious health problems. Changing your lifestyle can help to treat obesity. Follow these instructions at home: Eating and drinking   Follow advice from your doctor about what to eat and drink. Your doctor may tell you to: ? Cut down on (limit) fast foods, sweets, and processed snack foods. ? Choose low-fat options. For example, choose low-fat milk instead of whole milk. ? Eat 5 or more servings of fruits or vegetables every day. ? Eat at home more often. This gives you more control over what you eat. ? Choose healthy foods when you eat out. ? Learn what a healthy portion size is. A portion size is the amount of a certain food that is healthy for you to eat at one time. This is different for each person. ? Keep low-fat snacks available. ? Avoid sugary drinks. These include soda, fruit juice, iced tea that is sweetened with sugar, and flavored milk. ? Eat a healthy breakfast.  Drink enough water to keep your pee (urine) clear or pale yellow.  Do not go without eating for long periods of time (do not fast).  Do not go on popular or trendy diets (fad diets). Physical Activity  Exercise often, as told by your doctor. Ask your doctor: ? What types of exercise are safe for you. ? How often you should exercise.  Warm up and stretch before being active.  Do slow stretching after being active (cool down).  Rest between times of being active. Lifestyle  Limit how much time you spend in front of your TV, computer, or video game system (be less sedentary).  Find ways to reward yourself that do not involve food.  Limit alcohol intake to no more than 1 drink a day for nonpregnant women and 2 drinks a day for men. One drink equals 12 oz  of beer, 5 oz of wine, or 1 oz of hard liquor. General instructions  Keep a weight loss journal. This can help you keep track of: ? The food that you eat. ? The exercise that you do.  Take over-the-counter and prescription medicines only as told by your doctor.  Take vitamins and supplements only as told by your doctor.  Think about joining a support group. Your doctor may be able to help with this.  Keep all follow-up visits as told by your doctor. This is important. Contact a doctor if:  You cannot meet your weight loss goal after you have changed your diet and lifestyle for 6 weeks. This information is not intended to replace advice given to you by your health care provider. Make sure you discuss any questions you have with your health care provider. Document Released: 05/24/2011 Document Revised: 08/07/2015 Document Reviewed: 12/18/2014 Elsevier Interactive Patient Education  2018 [employer]  

## 2017-12-30 NOTE — Telephone Encounter (Signed)
error 

## 2018-01-04 IMAGING — CR DG ABDOMEN ACUTE W/ 1V CHEST
4 series · 4 of 4 positions shown · non-contrast
Comparison: None.

CLINICAL DATA: Fever.  Abdominal pain and distention.

EXAM:
DG ABDOMEN ACUTE W/ 1V CHEST

[w chest pa]
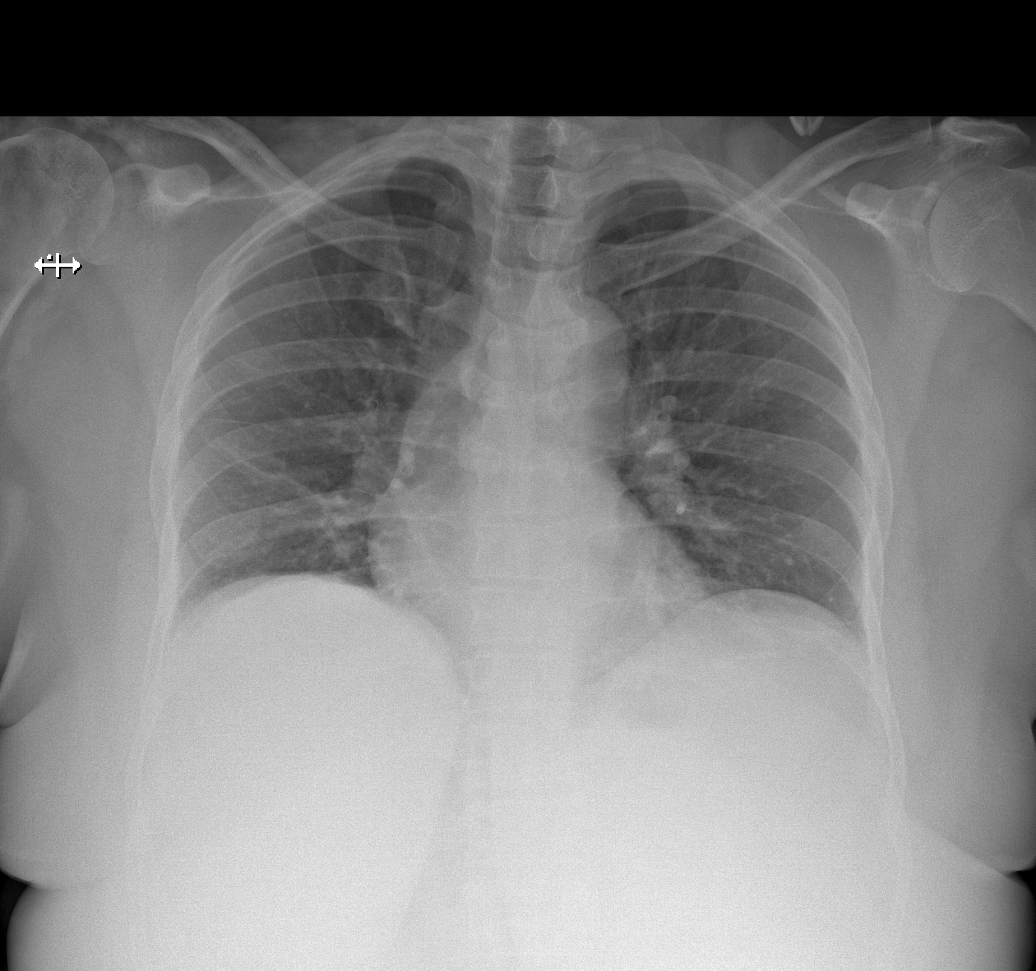

[w abdomen upright]
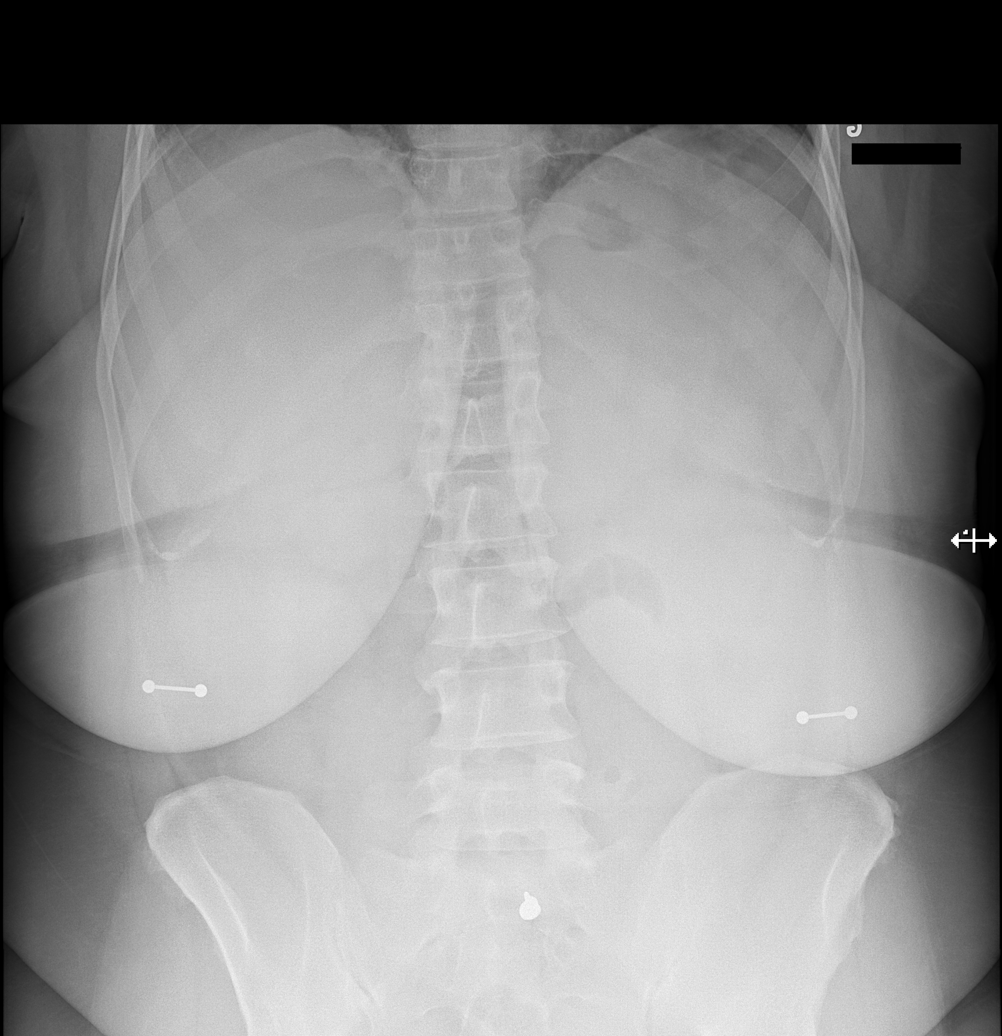

[t abdomen supine (1 of 2)]
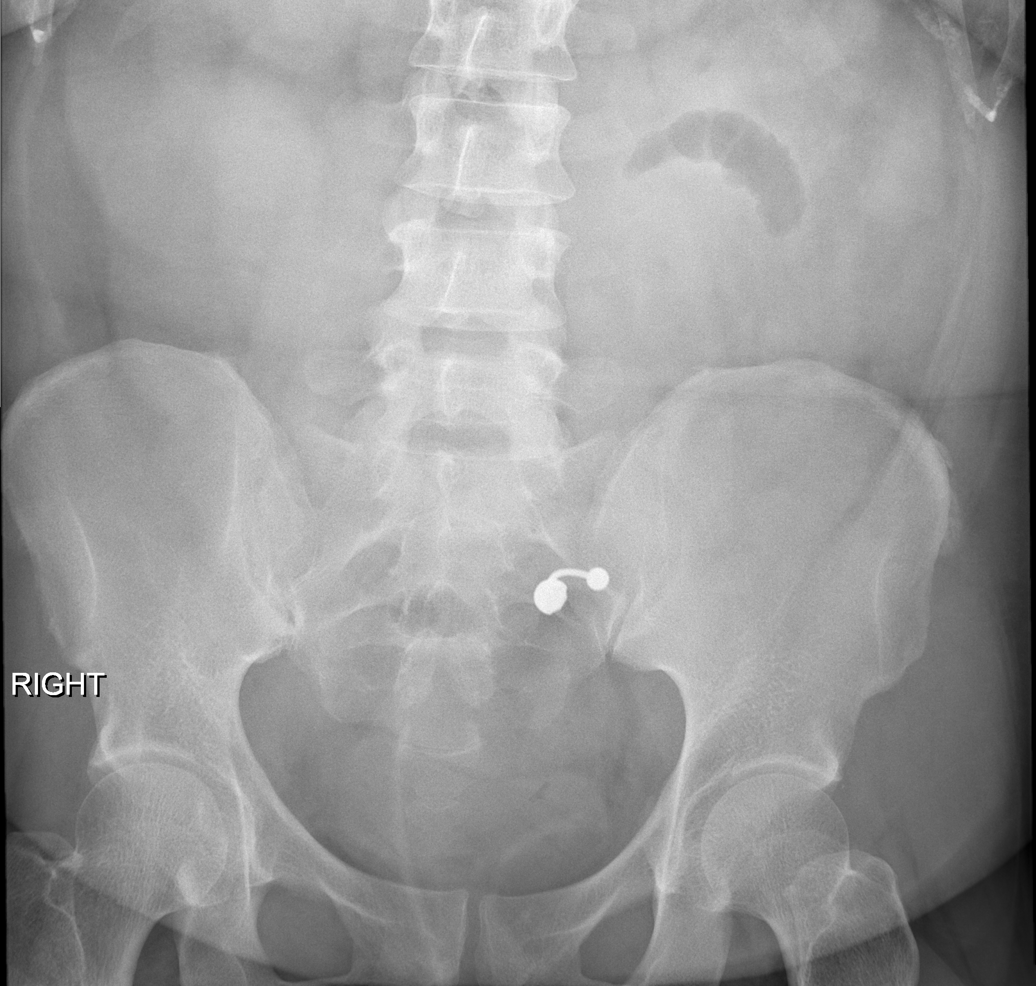

[t abdomen supine (2 of 2)]
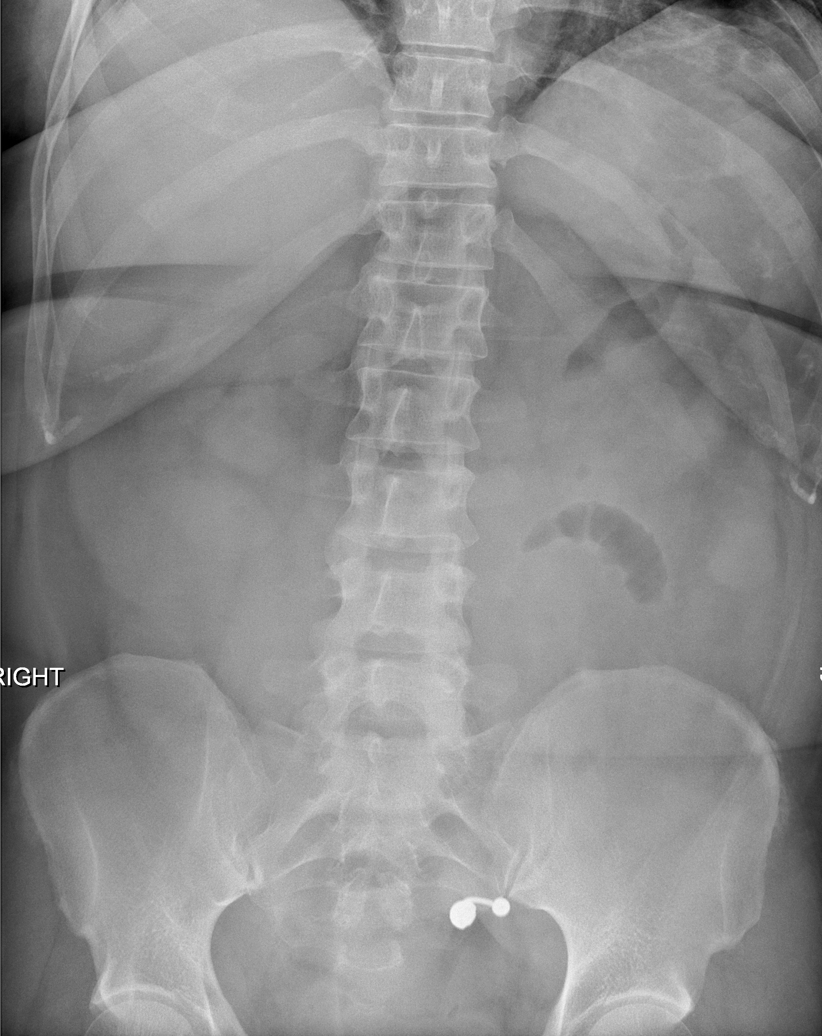

[4 of 4 positions shown; findings below may reference images not displayed]

FINDINGS: The cardiomediastinal silhouette is within normal limits. The lungs
are hypoinflated with minimal atelectasis in the right mid to lower
lung and left lung base. No lobar consolidation, edema, pleural
effusion, or pneumothorax is identified.

There is no evidence of intraperitoneal free air. A small amount of
gas is present in the stomach, and there is a small amount of gas in
a single nondilated small bowel loop in the left mid abdomen. No
significant bowel gas is seen elsewhere. No acute osseous
abnormality is seen.
IMPRESSION: 1. Mild hypoinflation without evidence of acute cardiopulmonary
process.
2. Paucity of bowel gas, limiting assessment for obstruction.

## 2018-01-04 IMAGING — CT CT ABD-PELV W/ CM
2 of 5 series · 16 of 46 positions shown, 18 images · IV contrast (ISOVUE)
Comparison: Radiographs earlier this day.

CLINICAL DATA: Abdominal pain and distension.

EXAM:
CT ABDOMEN AND PELVIS WITH CONTRAST
TECHNIQUE: Multidetector CT imaging of the abdomen and pelvis was performed
using the standard protocol following bolus administration of
intravenous contrast.
CONTRAST:  100 cc 3sovue-IRR IV

[Series 2: abd/pel with · axial · 0.74mm/px · z∈[+1181,+1571]mm · 13 of 90 slices shown, 15 images]
[im 6/90  soft-tissue]
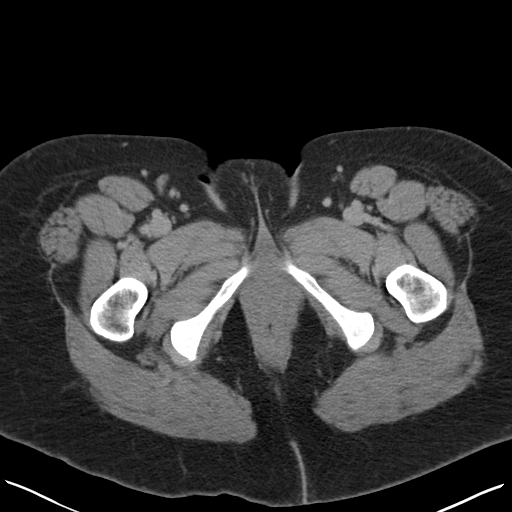
[im 6/90  bone]
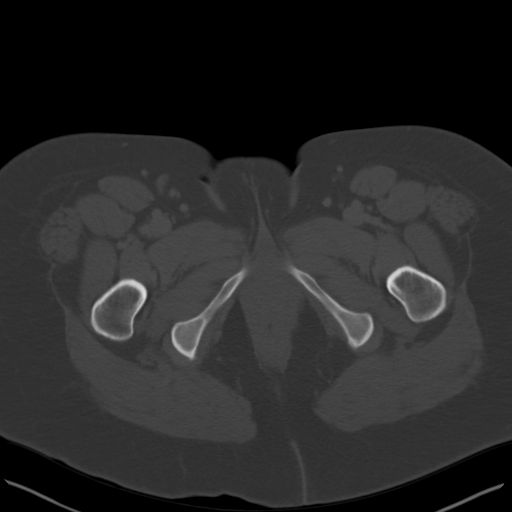
[im 12/90  soft-tissue]
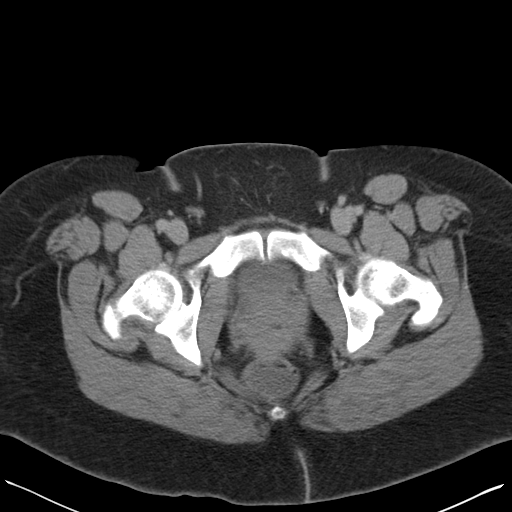
[im 17/90  soft-tissue]
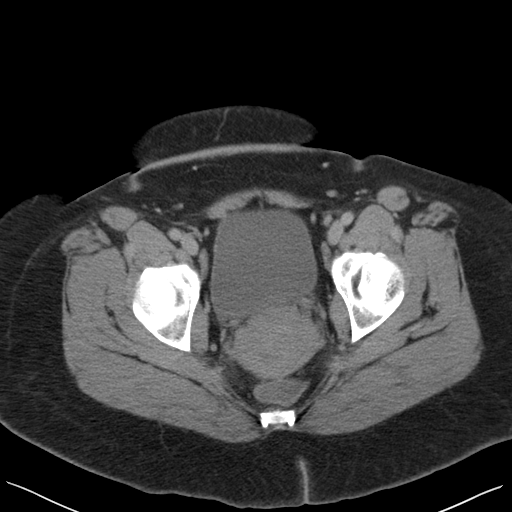
[im 28/90  soft-tissue]
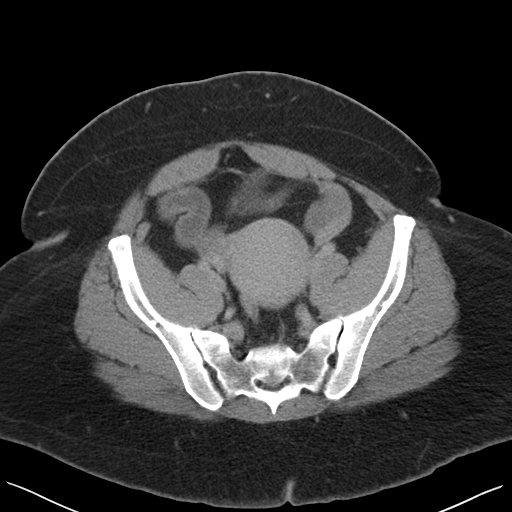
[im 34/90  soft-tissue]
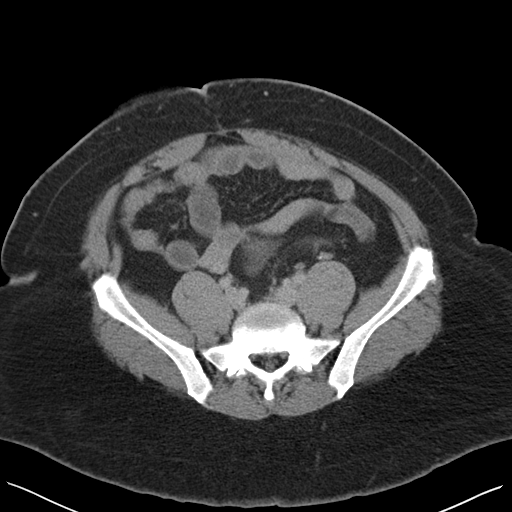
[im 39/90  soft-tissue]
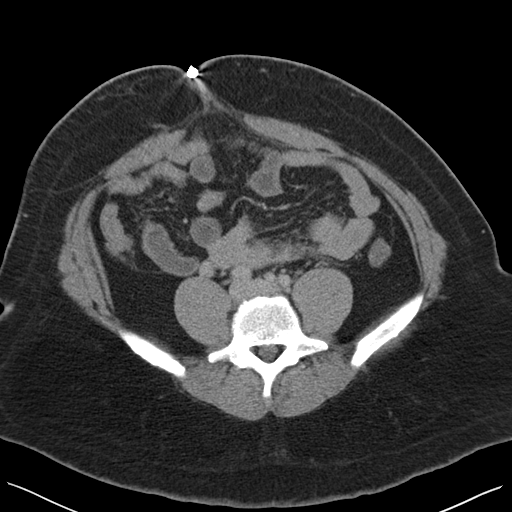
[im 45/90  soft-tissue]
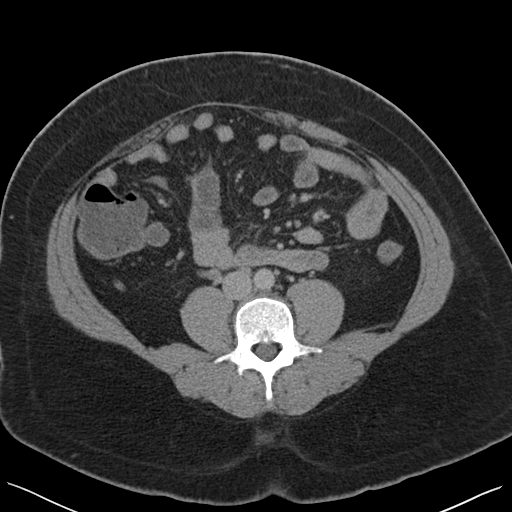
[im 51/90  soft-tissue]
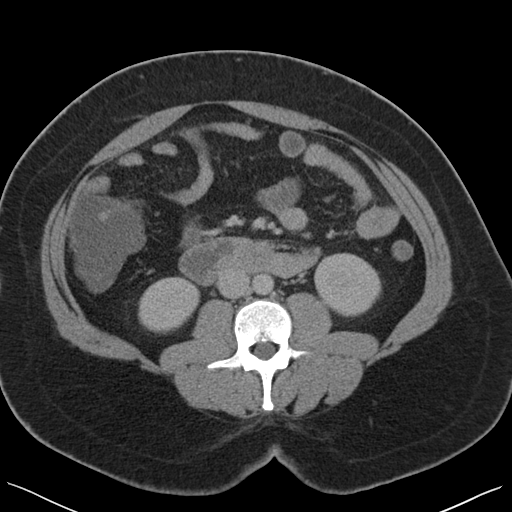
[im 56/90  soft-tissue]
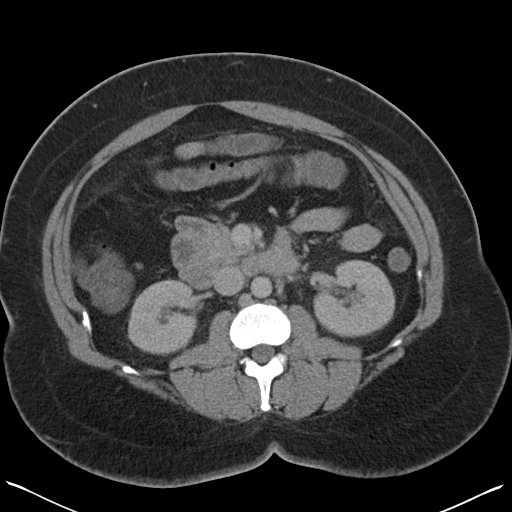
[im 56/90  bone]
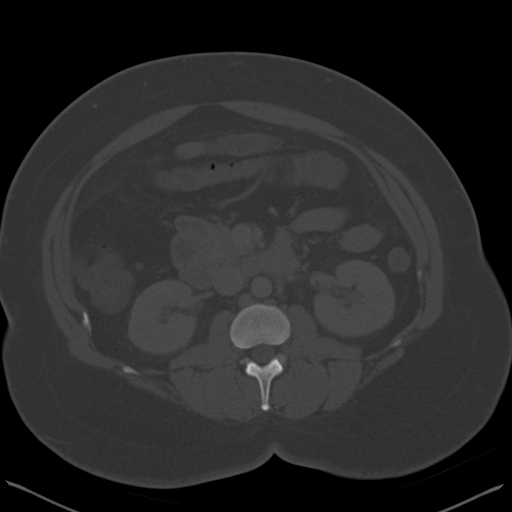
[im 62/90  soft-tissue]
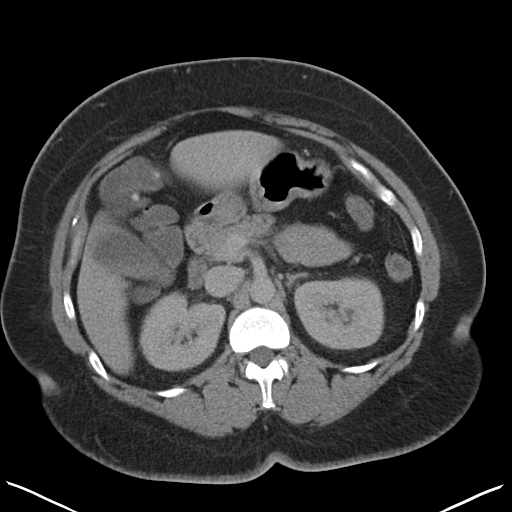
[im 73/90  soft-tissue]
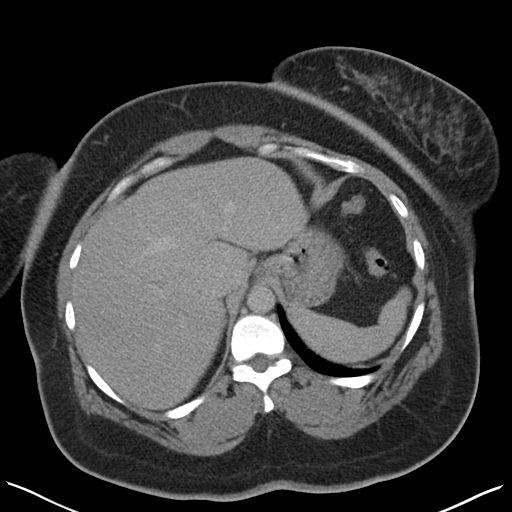
[im 78/90  soft-tissue]
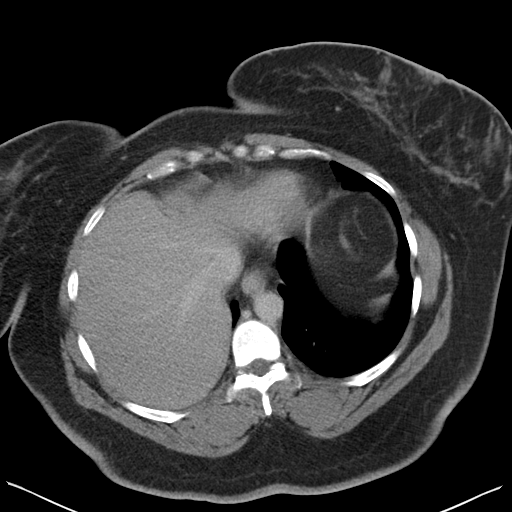
[im 84/90  soft-tissue]
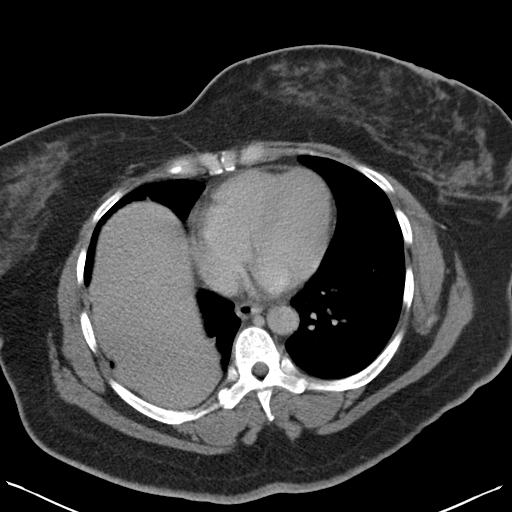

[Series 4: coronal a/|p · coronal · 0.74mm/px · 3 of 152 slices shown]
[im 51/152  soft-tissue]
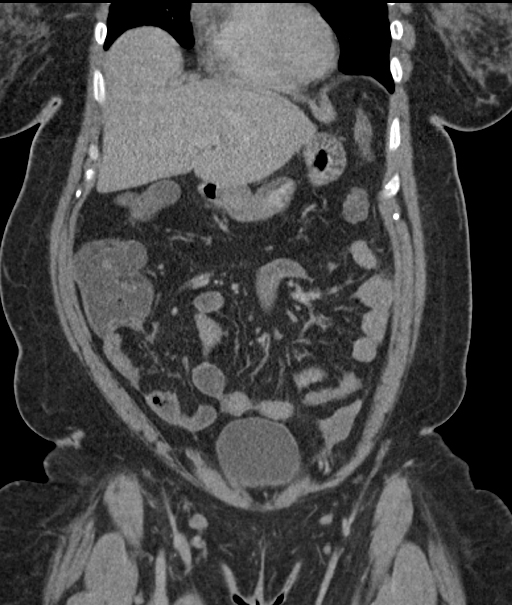
[im 68/152  soft-tissue]
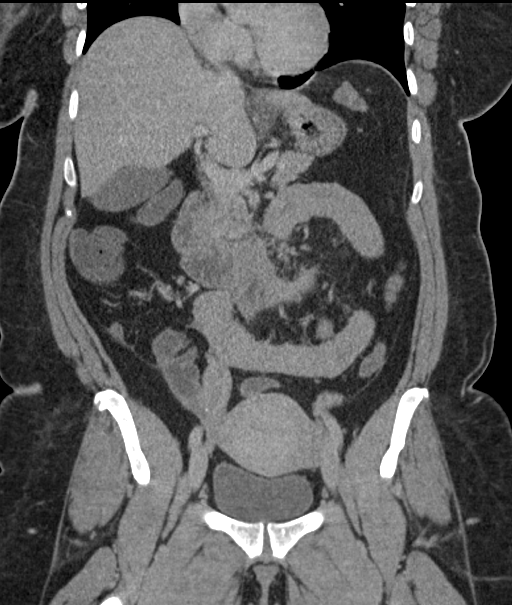
[im 84/152  soft-tissue]
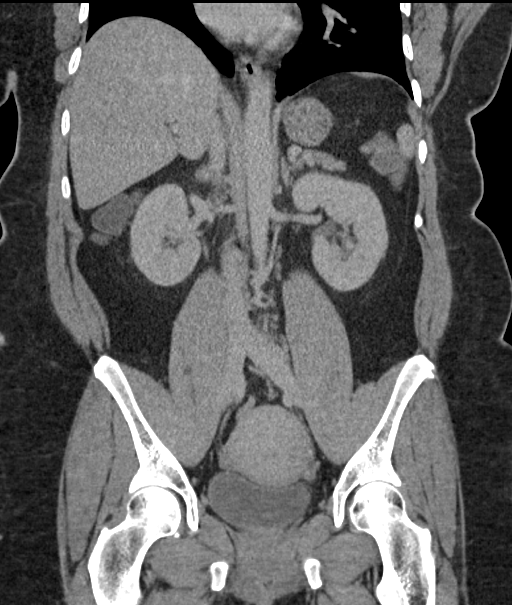

[16 of 46 positions shown; findings below may reference images not displayed]

FINDINGS: Lower chest: Compressive atelectasis at the right lung base adjacent
to mild eventration of right hemidiaphragm. No pleural fluid. No
consolidation.

Hepatobiliary: No focal hepatic lesion. Gallbladder physiologically
distended. Layering hyperdensity likely combination of small stones
and sludge. No biliary dilatation.

Pancreas: No ductal dilatation or inflammation.

Spleen: Normal in size without focal abnormality.

Adrenals/Urinary Tract: No adrenal nodule. Homogeneous renal
enhancement with symmetric excretion. No hydronephrosis or
perinephric edema. Urinary bladder is physiologically distended, no
bladder wall thickening.

Stomach/Bowel: Stomach is nondistended. No bowel obstruction. Distal
small bowel is fluid-filled with minimal mesenteric edema. The colon
is fluid-filled, minimal pericolonic edema about the descending
colon. There a few scattered colonic diverticular throughout the
entire colon without diverticulitis. Normal appendix.

Vascular/Lymphatic: No significant vascular findings are present. No
enlarged abdominal or pelvic lymph nodes.

Reproductive: Uterus is prominent size with soft tissue fullness in
the region of the cervix per no discrete focal mass. Ovaries are
symmetric in size. No adnexal mass.

Other: No free air, free fluid, or intra-abdominal fluid collection.

Musculoskeletal: There are no acute or suspicious osseous
abnormalities.
IMPRESSION: 1. Fluid-filled large and small bowel with faint perienteric edema,
suggesting mild enterocolitis. No obstruction.
2. Soft tissue fullness in the region of the cervix, may be
incidental, recommend correlation with physical exam and up-to-date
cervical cancer screening to exclude cervical mass.
3. Probable gallbladder stones and sludge. No gallbladder
inflammation.
4. Minimal colonic diverticulosis.

## 2018-01-21 ENCOUNTER — Other Ambulatory Visit: Payer: Self-pay | Admitting: Internal Medicine

## 2018-01-25 ENCOUNTER — Other Ambulatory Visit: Payer: Self-pay | Admitting: Internal Medicine

## 2018-01-25 MED ORDER — PHENTERMINE-TOPIRAMATE ER 7.5-46 MG PO CP24: 7.5000 mg | ORAL_CAPSULE | Freq: Every day | ORAL | 1 refills | Status: DC

## 2018-02-02 ENCOUNTER — Encounter: Payer: Self-pay | Admitting: Internal Medicine

## 2018-03-29 ENCOUNTER — Ambulatory Visit (INDEPENDENT_AMBULATORY_CARE_PROVIDER_SITE_OTHER): Payer: BLUE CROSS/BLUE SHIELD | Admitting: Internal Medicine

## 2018-03-29 ENCOUNTER — Encounter: Payer: Self-pay | Admitting: Internal Medicine

## 2018-03-29 VITALS — BP 112/78 | HR 75 | Temp 98.7°F | Ht 62.25 in | Wt 194.4 lb

## 2018-03-29 DIAGNOSIS — Z6835 Body mass index (BMI) 35.0-35.9, adult: Secondary | ICD-10-CM | POA: Diagnosis not present

## 2018-03-29 DIAGNOSIS — R350 Frequency of micturition: Secondary | ICD-10-CM

## 2018-03-29 DIAGNOSIS — I10 Essential (primary) hypertension: Secondary | ICD-10-CM | POA: Diagnosis not present

## 2018-03-29 NOTE — Progress Notes (Signed)
  Subjective:     Patient ID: Jessica Rodgers , female    DOB: [birth year] , 43 y.o.   MRN: [id number]   Chief Complaint  Patient presents with  . Hypertension    HPI  Hypertension  This is a chronic problem. The current episode started more than 1 year ago. The problem has been gradually improving since onset. The problem is controlled. Pertinent negatives include no blurred vision, chest pain, palpitations or shortness of breath. Risk factors for coronary artery disease include obesity.   She reports compliance with meds.   Past Medical History:  Diagnosis Date  . GERD (gastroesophageal reflux disease)   . Hypertension      Family History  Problem Relation Age of Onset  . Hypertension Mother   . Arthritis Mother   . Glaucoma Mother   . Breast cancer Maternal Grandmother 50     Current Outpatient Medications:  .  Cholecalciferol (VITAMIN D PO), Take 1 tablet by mouth daily. , Disp: , Rfl:  .  valsartan-hydrochlorothiazide (DIOVAN-HCT) 80-12.5 MG tablet, TAKE 1 TABLET BY MOUTH EVERY DAY, Disp: 90 tablet, Rfl: 1 .  Phentermine-Topiramate (QSYMIA) 7.5-46 MG CP24, Take 7.5-46 mg by mouth daily., Disp: 30 capsule, Rfl: 1   No Known Allergies   Review of Systems  Constitutional: Negative.   Eyes: Negative for blurred vision.  Respiratory: Negative.  Negative for shortness of breath.   Cardiovascular: Negative.  Negative for chest pain and palpitations.  Gastrointestinal: Negative.   Genitourinary: Positive for frequency (she wants to be checked for STds. she has experienced these sx in the past.  does not think she has a UTI).  Neurological: Negative.   Psychiatric/Behavioral: Negative.      Today's Vitals   03/29/18 0945  BP: 112/78  Pulse: 75  Temp: 98.7 F (37.1 C)  TempSrc: Oral  Weight: 194 lb 6.4 oz (88.2 kg)  Height: 5' 2.25" (1.581 m)   Body mass index is 35.27 kg/m.   Objective:  Physical Exam Vitals signs and nursing note reviewed.  Constitutional:       Appearance: Normal appearance. She is obese.  HENT:     Head: Normocephalic and atraumatic.  Cardiovascular:     Rate and Rhythm: Normal rate and regular rhythm.     Heart sounds: Normal heart sounds.  Pulmonary:     Effort: Pulmonary effort is normal.     Breath sounds: Normal breath sounds.  Neurological:     General: No focal deficit present.     Mental Status: She is alert.     Sensory: Sensory deficit:   Psychiatric:        Mood and Affect: Mood normal.         Assessment And Plan:     1. Essential hypertension, benign  Well controlled. She will continue with current meds.   2. Increased urinary frequency  I will check urine for GC/Chlamydia/Trich as requested.   - NuSwab Vaginitis Plus (VG+)  3. Class 2 severe obesity due to excess calories with serious comorbidity and body mass index (BMI) of 35.0 to 35.9 in adult [facility])  She has lost 43 pounds in the past year. She was congratulated on her weight loss thus far and is encouraged to keep up the great work. She is encouraged to strive for BMI less than 30 to decrease cardiac risk. She would like to decrease to lower dose of Qsymia. 7.5mg   Maximino Greenland, MD

## 2018-04-02 LAB — CHLAMYDIA/GONOCOCCUS/TRICHOMONAS, NAA
CHLAMYDIA BY NAA: NEGATIVE
GONOCOCCUS BY NAA: NEGATIVE
TRICH VAG BY NAA: POSITIVE — AB

## 2018-05-12 ENCOUNTER — Telehealth: Payer: Self-pay

## 2018-05-12 ENCOUNTER — Other Ambulatory Visit: Payer: Self-pay

## 2018-05-12 MED ORDER — METRONIDAZOLE 500 MG PO TABS: 500.0000 mg | ORAL_TABLET | Freq: Two times a day (BID) | ORAL | 0 refills | Status: DC

## 2018-05-12 NOTE — Telephone Encounter (Signed)
I returned the  Pt's call and she  was given her most recent lab results .  Laurance Flatten, NP sent in antibiotic to the pt's pharmacy.

## 2018-05-23 ENCOUNTER — Ambulatory Visit (INDEPENDENT_AMBULATORY_CARE_PROVIDER_SITE_OTHER): Payer: BLUE CROSS/BLUE SHIELD | Admitting: Internal Medicine

## 2018-05-23 ENCOUNTER — Encounter: Payer: Self-pay | Admitting: Internal Medicine

## 2018-05-23 VITALS — BP 124/80 | HR 67 | Ht 63.0 in | Wt 192.4 lb

## 2018-05-23 DIAGNOSIS — Z202 Contact with and (suspected) exposure to infections with a predominantly sexual mode of transmission: Secondary | ICD-10-CM | POA: Diagnosis not present

## 2018-05-23 DIAGNOSIS — Z6834 Body mass index (BMI) 34.0-34.9, adult: Secondary | ICD-10-CM

## 2018-05-23 DIAGNOSIS — I10 Essential (primary) hypertension: Secondary | ICD-10-CM

## 2018-05-23 DIAGNOSIS — Z1231 Encounter for screening mammogram for malignant neoplasm of breast: Secondary | ICD-10-CM | POA: Diagnosis not present

## 2018-05-23 DIAGNOSIS — E6609 Other obesity due to excess calories: Secondary | ICD-10-CM | POA: Diagnosis not present

## 2018-05-24 LAB — BMP8+EGFR
BUN/Creatinine Ratio: 12 (ref 9–23)
BUN: 10 mg/dL (ref 6–24)
CO2: 21 mmol/L (ref 20–29)
Calcium: 9.1 mg/dL (ref 8.7–10.2)
Chloride: 102 mmol/L (ref 96–106)
Creatinine, Ser: 0.86 mg/dL (ref 0.57–1.00)
GFR calc Af Amer: 94 mL/min/{1.73_m2} (ref >59)
GFR calc non Af Amer: 82 mL/min/{1.73_m2} (ref >59)
GLUCOSE: 89 mg/dL (ref 65–99)
Potassium: 4.1 mmol/L (ref 3.5–5.2)
Sodium: 137 mmol/L (ref 134–144)

## 2018-05-25 LAB — TRICHOMONAS VAGINALIS, PROBE AMP: Trich vag by NAA: NEGATIVE

## 2018-06-05 ENCOUNTER — Other Ambulatory Visit: Payer: Self-pay | Admitting: Internal Medicine

## 2018-06-05 DIAGNOSIS — Z1231 Encounter for screening mammogram for malignant neoplasm of breast: Secondary | ICD-10-CM

## 2018-06-11 NOTE — Progress Notes (Signed)
  Subjective:     Patient ID: Jessica Rodgers , female    DOB: Mar 27, 1975 , 43 y.o.   MRN: [id number]   Chief Complaint  Patient presents with  . Hypertension    HPI  Hypertension  This is a chronic problem. The current episode started more than 1 year ago. The problem has been gradually improving since onset. The problem is controlled.     Past Medical History:  Diagnosis Date  . GERD (gastroesophageal reflux disease)   . Hypertension      Family History  Problem Relation Age of Onset  . Hypertension Mother   . Arthritis Mother   . Glaucoma Mother   . Breast cancer Maternal Grandmother 50     Current Outpatient Medications:  .  Cholecalciferol (VITAMIN D PO), Take 1 tablet by mouth daily. , Disp: , Rfl:  .  Phentermine-Topiramate (QSYMIA) 7.5-46 MG CP24, Take 7.5-46 mg by mouth daily., Disp: 30 capsule, Rfl: 1 .  valsartan-hydrochlorothiazide (DIOVAN-HCT) 160-12.5 MG tablet, Take 1 tablet by mouth daily., Disp: , Rfl:    No Known Allergies   Review of Systems  Constitutional: Negative.   Respiratory: Negative.   Cardiovascular: Negative.   Gastrointestinal: Negative.   Neurological: Negative.   Psychiatric/Behavioral: Negative.      Today's Vitals   05/23/18 0928  BP: 124/80  Pulse: 67  SpO2: 97%  Weight: 192 lb 6.4 oz (87.3 kg)  Height: 5' 3" (1.6 m)   Body mass index is 34.08 kg/m.   Objective:  Physical Exam Vitals signs and nursing note reviewed.  Constitutional:      Appearance: Normal appearance.  HENT:     Head: Normocephalic and atraumatic.  Cardiovascular:     Rate and Rhythm: Normal rate and regular rhythm.     Heart sounds: Normal heart sounds.  Pulmonary:     Effort: Pulmonary effort is normal.     Breath sounds: Normal breath sounds.  Skin:    General: Skin is warm.  Neurological:     General: No focal deficit present.     Mental Status: She is alert.  Psychiatric:        Mood and Affect: Mood normal.        Behavior: Behavior  normal.         Assessment And Plan:     1. Essential hypertension, benign  Well controlled. She will continue with current meds.   - BMP8+EGFR  2. Class 1 obesity due to excess calories with serious comorbidity and body mass index (BMI) of 34.0 to 34.9 in adult  She was congratulated on her weight thus far. She has lost over 20 pounds thus far. She is encouraged to strive for BMI less than 30. She will continue with Qsymia 7.59m once daily.   3. Breast cancer screening by mammogram  - MM Digital Screening; Future  4. Exposure to trichomonas  I will check urine for Trich. She recently completed treatment.   - Trichomonas vaginalis, RNA        RMaximino Greenland MD

## 2018-07-11 ENCOUNTER — Ambulatory Visit: Payer: BLUE CROSS/BLUE SHIELD

## 2018-07-26 ENCOUNTER — Other Ambulatory Visit: Payer: Self-pay

## 2018-07-26 ENCOUNTER — Encounter: Payer: Self-pay | Admitting: Internal Medicine

## 2018-07-26 ENCOUNTER — Ambulatory Visit (INDEPENDENT_AMBULATORY_CARE_PROVIDER_SITE_OTHER): Payer: BLUE CROSS/BLUE SHIELD | Admitting: Internal Medicine

## 2018-07-26 VITALS — BP 112/70 | HR 68 | Temp 98.5°F | Ht 63.0 in | Wt 189.2 lb

## 2018-07-26 DIAGNOSIS — Z6833 Body mass index (BMI) 33.0-33.9, adult: Secondary | ICD-10-CM

## 2018-07-26 DIAGNOSIS — E6609 Other obesity due to excess calories: Secondary | ICD-10-CM

## 2018-07-26 DIAGNOSIS — R202 Paresthesia of skin: Secondary | ICD-10-CM

## 2018-07-26 DIAGNOSIS — I10 Essential (primary) hypertension: Secondary | ICD-10-CM | POA: Diagnosis not present

## 2018-07-26 NOTE — Patient Instructions (Signed)
Paresthesia Paresthesia is a burning or prickling feeling. This feeling can happen in any part of the body. It often happens in the hands, arms, legs, or feet. Usually, it is not painful. In most cases, the feeling goes away in a short time and is not a sign of a serious problem. If you have paresthesia that lasts a long time, you may need to be seen by your doctor. Follow these instructions at home: Alcohol use   Do not drink alcohol if: ? Your doctor tells you not to drink. ? You are pregnant, may be pregnant, or are planning to become pregnant.  If you drink alcohol, limit how much you have: ? 0-1 drink a day for women. ? 0-2 drinks a day for men.  Be aware of how much alcohol is in your drink. In the [place] one drink equals one typical bottle of beer (12 oz), one-half glass of wine (5 oz), or one shot of hard liquor (1 oz). Nutrition  Eat a healthy diet. This includes: ? Eating foods that have a lot of fiber in them, such as fresh fruits and vegetables, whole grains, and beans. ? Limiting foods that have a lot of fat and processed sugars in them, such as fried or sweet foods. General instructions  Take over-the-counter and prescription medicines only as told by your doctor.  Do not use any products that have nicotine or tobacco in them, such as cigarettes and e-cigarettes. If you need help quitting, ask your doctor.  If you have diabetes, work with your doctor to make sure your blood sugar stays in a healthy range.  If your feet feel numb: ? Check for redness, warmth, and swelling every day. ? Wear padded socks and comfortable shoes. These help protect your feet.  Keep all follow-up visits as told by your doctor. This is important. Contact a doctor if:  You have paresthesia that gets worse or does not go away.  Your burning or prickling feeling gets worse when you walk.  You have pain or cramps.  You feel dizzy.  You have a rash. Get help right away if you:  Feel  weak.  Have trouble walking or moving.  Have problems speaking, understanding, or seeing.  Feel confused.  Cannot control when you pee (urinate) or poop (have a bowel movement).  Lose feeling (have numbness) after an injury.  Have new weakness in an arm or leg.  Pass out (faint). Summary  Paresthesia is a burning or prickling feeling. It often happens in the hands, arms, legs, or feet.  In most cases, the feeling goes away in a short time and is not a sign of a serious problem.  If you have paresthesia that lasts a long time, you may need to be seen by your doctor. This information is not intended to replace advice given to you by your health care provider. Make sure you discuss any questions you have with your health care provider. Document Released: 02/12/2008 Document Revised: 03/10/2017 Document Reviewed: 03/10/2017 Elsevier Interactive Patient Education  2019 Elsevier Inc.  

## 2018-07-27 LAB — TSH: TSH: 1.56 u[IU]/mL (ref 0.450–4.500)

## 2018-07-27 LAB — VITAMIN B12: Vitamin B-12: 713 pg/mL (ref 232–1245)

## 2018-08-03 ENCOUNTER — Other Ambulatory Visit: Payer: Self-pay | Admitting: Internal Medicine

## 2018-08-07 NOTE — Progress Notes (Signed)
Subjective:     Patient ID: Jessica Rodgers , female    DOB: [birth year] , 43 y.o.   MRN: [id number]   Chief Complaint  Patient presents with  . Hypertension    HPI  Hypertension  This is a chronic problem. The current episode started more than 1 year ago. The problem has been gradually improving since onset. The problem is controlled. Pertinent negatives include no blurred vision, chest pain, palpitations or shortness of breath. Past treatments include ACE inhibitors, angiotensin blockers and diuretics. The current treatment provides moderate improvement.     Past Medical History:  Diagnosis Date  . GERD (gastroesophageal reflux disease)   . Hypertension      Family History  Problem Relation Age of Onset  . Hypertension Mother   . Arthritis Mother   . Glaucoma Mother   . Other Father        unsure of his health  . Breast cancer Maternal Grandmother 50     Current Outpatient Medications:  .  Cholecalciferol (VITAMIN D PO), Take 1 tablet by mouth daily. , Disp: , Rfl:  .  Phentermine-Topiramate (QSYMIA) 7.5-46 MG CP24, Take 7.5-46 mg by mouth daily., Disp: 30 capsule, Rfl: 1 .  valsartan-hydrochlorothiazide (DIOVAN-HCT) 160-12.5 MG tablet, Take 1 tablet by mouth daily., Disp: , Rfl:  .  valsartan-hydrochlorothiazide (DIOVAN-HCT) 80-12.5 MG tablet, TAKE 1 TABLET BY MOUTH EVERY DAY, Disp: 90 tablet, Rfl: 1   No Known Allergies   Review of Systems  Constitutional: Negative.   Eyes: Negative for blurred vision.  Respiratory: Negative.  Negative for shortness of breath.   Cardiovascular: Negative.  Negative for chest pain and palpitations.  Gastrointestinal: Negative.   Neurological: Negative.   Psychiatric/Behavioral: Negative.      Today's Vitals   07/26/18 0922  BP: 112/70  Pulse: 68  Temp: 98.5 F (36.9 C)  TempSrc: Oral  Weight: 189 lb 3.2 oz (85.8 kg)  Height: 5\' 3"  (1.6 m)   Body mass index is 33.52 kg/m.   Objective:  Physical Exam Vitals signs and  nursing note reviewed.  Constitutional:      Appearance: Normal appearance.  HENT:     Head: Normocephalic and atraumatic.  Cardiovascular:     Rate and Rhythm: Normal rate and regular rhythm.     Heart sounds: Normal heart sounds.  Pulmonary:     Effort: Pulmonary effort is normal.     Breath sounds: Normal breath sounds.  Musculoskeletal:     Comments: Neg Tinel's sign, Neg Phalen's sign b/l  Skin:    General: Skin is warm.  Neurological:     General: No focal deficit present.     Mental Status: She is alert.  Psychiatric:        Mood and Affect: Mood normal.        Behavior: Behavior normal.         Assessment And Plan:     1. Essential hypertension, benign  Well controlled. She will continue with current meds. She is encouraged to avoid adding salt to her foods.   2. Paresthesia of finger  I personally think her sx are due to topamax, an ingredient in Qsymia. I will check labs as below to r/o other causes. She does not wish to stop the medication at this time. She will let me know if her sx persist/worsen.  - TSH - Vitamin B12  3. Class 1 obesity due to excess calories with serious comorbidity and body mass index (BMI) of 33.0  to 33.9 in adult  She will continue with Qsymia for now. Her goal is 170-175 pounds. She was congratulated on her weight loss thus far and encouraged to keep up the great work .  Maximino Greenland, MD    THE PATIENT IS ENCOURAGED TO PRACTICE SOCIAL DISTANCING DUE TO THE COVID-19 PANDEMIC.

## 2018-08-18 ENCOUNTER — Ambulatory Visit: Payer: BLUE CROSS/BLUE SHIELD

## 2018-08-24 ENCOUNTER — Encounter: Payer: Self-pay | Admitting: Internal Medicine

## 2018-09-06 ENCOUNTER — Encounter: Payer: Self-pay | Admitting: Internal Medicine

## 2018-09-06 ENCOUNTER — Other Ambulatory Visit: Payer: Self-pay | Admitting: Internal Medicine

## 2018-09-06 DIAGNOSIS — R202 Paresthesia of skin: Secondary | ICD-10-CM

## 2018-09-11 ENCOUNTER — Other Ambulatory Visit: Payer: Self-pay | Admitting: Internal Medicine

## 2018-09-11 DIAGNOSIS — R202 Paresthesia of skin: Secondary | ICD-10-CM

## 2018-10-05 ENCOUNTER — Other Ambulatory Visit: Payer: Self-pay

## 2018-10-05 ENCOUNTER — Ambulatory Visit
Admission: RE | Admit: 2018-10-05 | Discharge: 2018-10-05 | Disposition: A | Discharge status: Home / Self Care | Payer: BC Managed Care – PPO | Source: Ambulatory Visit | Attending: Internal Medicine | Admitting: Internal Medicine

## 2018-10-05 DIAGNOSIS — Z1231 Encounter for screening mammogram for malignant neoplasm of breast: Secondary | ICD-10-CM

## 2018-11-13 ENCOUNTER — Telehealth: Payer: Self-pay

## 2018-11-13 ENCOUNTER — Encounter: Payer: BLUE CROSS/BLUE SHIELD | Admitting: Internal Medicine

## 2018-11-13 ENCOUNTER — Encounter: Payer: Self-pay | Admitting: Internal Medicine

## 2018-11-13 NOTE — Telephone Encounter (Signed)
I returned the pt's call to reschedule her app and the pt said that she has gotten it taken care of.

## 2018-12-11 ENCOUNTER — Encounter: Payer: Self-pay | Admitting: Internal Medicine

## 2018-12-12 ENCOUNTER — Encounter: Payer: Self-pay | Admitting: Nurse Practitioner

## 2018-12-12 ENCOUNTER — Ambulatory Visit (INDEPENDENT_AMBULATORY_CARE_PROVIDER_SITE_OTHER): Payer: BC Managed Care – PPO | Admitting: Nurse Practitioner

## 2018-12-12 ENCOUNTER — Other Ambulatory Visit: Payer: Self-pay

## 2018-12-12 VITALS — BP 100/60 | HR 71 | Temp 98.4°F | Ht 63.0 in | Wt 201.4 lb

## 2018-12-12 DIAGNOSIS — N926 Irregular menstruation, unspecified: Secondary | ICD-10-CM

## 2018-12-12 DIAGNOSIS — Z720 Tobacco use: Secondary | ICD-10-CM

## 2018-12-12 DIAGNOSIS — E669 Obesity, unspecified: Secondary | ICD-10-CM

## 2018-12-12 NOTE — Patient Instructions (Signed)
Menorrhagia Menorrhagia is when your menstrual periods are heavy or last longer than normal. Follow these instructions at home: Medicines   Take over-the-counter and prescription medicines exactly as told by your doctor. This includes iron pills.  Do not change or switch medicines without asking your doctor.  Do not take aspirin or medicines that contain aspirin 1 week before or during your period. Aspirin may make bleeding worse. General instructions  If you need to change your pad or tampon more than once every 2 hours, limit your activity until the bleeding stops.  Iron pills can cause problems when pooping (constipation). To prevent or treat pooping problems while taking prescription iron pills, your doctor may suggest that you: ? Drink enough fluid to keep your pee (urine) clear or pale yellow. ? Take over-the-counter or prescription medicines. ? Eat foods that are high in fiber. These foods include: ? Fresh fruits and vegetables. ? Whole grains. ? Beans. ? Limit foods that are high in fat and processed sugars. This includes fried and sweet foods.  Eat healthy meals and foods that are high in iron. Foods that have a lot of iron include: ? Leafy green vegetables. ? Meat. ? Liver. ? Eggs. ? Whole grain breads and cereals.  Do not try to lose weight until your heavy bleeding has stopped and you have normal amounts of iron in your blood. If you need to lose weight, work with your doctor.  Keep all follow-up visits as told by your doctor. This is important. Contact a doctor if:  You soak through a pad or tampon every 1 or 2 hours, and this happens every time you have a period.  You need to use pads and tampons at the same time because you are bleeding so much.  You are taking medicine and you: ? Feel sick to your stomach (nauseous). ? Throw up (vomit). ? Have watery poop (diarrhea).  You have other problems that may be related to the medicine you are taking. Get help  right away if:  You soak through more than a pad or tampon in 1 hour.  You pass clots bigger than 1 inch (2.5 cm) wide.  You feel short of breath.  You feel like your heart is beating too fast.  You feel dizzy or you pass out (faint).  You feel very weak or tired. Summary  Menorrhagia is when your menstrual periods are heavy or last longer than normal.  Take over-the-counter and prescription medicines exactly as told by your doctor. This includes iron pills.  Contact a doctor if you soak through more than a pad or tampon in 1 hour or are passing large clots. This information is not intended to replace advice given to you by your health care provider. Make sure you discuss any questions you have with your health care provider. Document Released: 12/09/2007 Document Revised: 06/08/2017 Document Reviewed: 03/22/2016 Elsevier Patient Education  2020 Elsevier Inc.  

## 2018-12-12 NOTE — Progress Notes (Signed)
Subjective:     Patient ID: Jessica Rodgers , female    DOB: [birth year] , 43 y.o.   MRN: [id number]   Chief Complaint  Patient presents with  . Menstrual Problem    patient stated she has been having some irregular periods for the past year. she stated the last time she went to a OBGYN they told her a couple factors like her age,smoking etc. the last time she went to the obgyn was within the last 2 years. patient also stated she has had some clotting.    HPI  She is having irregular menses with 2 menses this month.  This time with some clots.  October 9th-13th then again the following Tuesday.  She is now spotting, today is 7 days of her cycle. Periodically she will have two cycles per month.  She seen her GYN - in the past 2 years.    Her mother had hysterectomy in her early 42's.    She is trying to slow down with smoking.  She has increased her weight as well. She is back on Qsymia. Wt Readings from Last 3 Encounters: 12/12/18 : 201 lb 6.4 oz (91.4 kg) 07/26/18 : 189 lb 3.2 oz (85.8 kg) 05/23/18 : 192 lb 6.4 oz (87.3 kg)  Continues to have menstrual cycles monthly mostly.      Past Medical History:  Diagnosis Date  . GERD (gastroesophageal reflux disease)   . Hypertension      Family History  Problem Relation Age of Onset  . Hypertension Mother   . Arthritis Mother   . Glaucoma Mother   . Other Father        unsure of his health  . Breast cancer Maternal Grandmother 50     Current Outpatient Medications:  .  Cholecalciferol (VITAMIN D PO), Take 1 tablet by mouth daily. , Disp: , Rfl:  .  Phentermine-Topiramate (QSYMIA) 7.5-46 MG CP24, Take 7.5-46 mg by mouth daily., Disp: 30 capsule, Rfl: 1 .  valsartan-hydrochlorothiazide (DIOVAN-HCT) 160-12.5 MG tablet, Take 1 tablet by mouth daily., Disp: , Rfl:  .  valsartan-hydrochlorothiazide (DIOVAN-HCT) 80-12.5 MG tablet, TAKE 1 TABLET BY MOUTH EVERY DAY, Disp: 90 tablet, Rfl: 1   No Known Allergies   Review of  Systems  Constitutional: Negative.   Respiratory: Negative.   Cardiovascular: Negative.  Negative for chest pain, palpitations and leg swelling.  Gastrointestinal: Negative.   Genitourinary: Positive for menstrual problem (2 cycles within a week).  Musculoskeletal: Negative.   Neurological: Negative for dizziness and headaches.     Today's Vitals   12/12/18 1132  BP: 100/60  Pulse: 71  Temp: 98.4 F (36.9 C)  TempSrc: Oral  Weight: 201 lb 6.4 oz (91.4 kg)  Height: 5\' 3"  (1.6 m)  PainSc: 0-No pain   Body mass index is 35.68 kg/m.   Objective:  Physical Exam Vitals signs reviewed.  Constitutional:      Appearance: Normal appearance.  Cardiovascular:     Rate and Rhythm: Normal rate and regular rhythm.     Pulses: Normal pulses.     Heart sounds: Normal heart sounds. No murmur.  Pulmonary:     Effort: Pulmonary effort is normal. No respiratory distress.     Breath sounds: Normal breath sounds.  Skin:    General: Skin is warm.     Capillary Refill: Capillary refill takes less than 2 seconds.  Neurological:     General: No focal deficit present.     Mental Status: She is  alert and oriented to person, place, and time.  Psychiatric:        Mood and Affect: Mood normal.        Behavior: Behavior normal.        Thought Content: Thought content normal.        Judgment: Judgment normal.         Assessment And Plan:   1. Abnormal menses  Perimenopause vs fibroids vs metabolic vs weight  Will check blood levels to ensure has not become hypovolemic - CBC no Diff - TSH  2. Obesity (BMI 35.0-39.9 without comorbidity)  Chronic  Discussed healthy diet and regular exercise options   Encouraged to exercise at least 150 minutes per week with 2 days of strength training  Discussed how an increase in weight can affect menses regularity      Minette Brine, FNP    THE PATIENT IS ENCOURAGED TO PRACTICE SOCIAL DISTANCING DUE TO THE COVID-19 PANDEMIC.

## 2018-12-13 LAB — CBC
Hematocrit: 32.3 % — ABNORMAL LOW (ref 34.0–46.6)
Hemoglobin: 11.4 g/dL (ref 11.1–15.9)
MCH: 34.9 pg — ABNORMAL HIGH (ref 26.6–33.0)
MCHC: 35.3 g/dL (ref 31.5–35.7)
MCV: 99 fL — ABNORMAL HIGH (ref 79–97)
Platelets: 306 10*3/uL (ref 150–450)
RBC: 3.27 x10E6/uL — ABNORMAL LOW (ref 3.77–5.28)
RDW: 10.6 % — ABNORMAL LOW (ref 11.7–15.4)
WBC: 8.1 10*3/uL (ref 3.4–10.8)

## 2018-12-13 LAB — TSH: TSH: 1.54 u[IU]/mL (ref 0.450–4.500)

## 2019-01-16 ENCOUNTER — Encounter: Payer: Self-pay | Admitting: Internal Medicine

## 2019-01-16 ENCOUNTER — Ambulatory Visit (INDEPENDENT_AMBULATORY_CARE_PROVIDER_SITE_OTHER): Payer: BC Managed Care – PPO | Admitting: Internal Medicine

## 2019-01-16 ENCOUNTER — Other Ambulatory Visit: Payer: Self-pay

## 2019-01-16 VITALS — BP 118/82 | HR 78 | Temp 98.4°F | Ht 66.2 in | Wt 200.2 lb

## 2019-01-16 DIAGNOSIS — E6609 Other obesity due to excess calories: Secondary | ICD-10-CM

## 2019-01-16 DIAGNOSIS — J3089 Other allergic rhinitis: Secondary | ICD-10-CM

## 2019-01-16 DIAGNOSIS — I10 Essential (primary) hypertension: Secondary | ICD-10-CM

## 2019-01-16 DIAGNOSIS — Z0000 Encounter for general adult medical examination without abnormal findings: Secondary | ICD-10-CM | POA: Diagnosis not present

## 2019-01-16 DIAGNOSIS — Z716 Tobacco abuse counseling: Secondary | ICD-10-CM

## 2019-01-16 DIAGNOSIS — Z6832 Body mass index (BMI) 32.0-32.9, adult: Secondary | ICD-10-CM

## 2019-01-16 LAB — POCT UA - MICROALBUMIN
Albumin/Creatinine Ratio, Urine, POC: <30
Creatinine, POC: 200 mg/dL
Microalbumin Ur, POC: 30 mg/L

## 2019-01-16 LAB — POCT URINALYSIS DIPSTICK
Bilirubin, UA: NEGATIVE
Blood, UA: NEGATIVE
Glucose, UA: NEGATIVE
Ketones, UA: NEGATIVE
Leukocytes, UA: NEGATIVE
Nitrite, UA: NEGATIVE
Protein, UA: NEGATIVE
Spec Grav, UA: 1.025 (ref 1.010–1.025)
Urobilinogen, UA: 0.2 E.U./dL
pH, UA: 5 (ref 5.0–8.0)

## 2019-01-16 NOTE — Progress Notes (Signed)
Subjective:     Patient ID: Jessica Rodgers , female    DOB: 05/17/1975 , 43 y.o.   MRN: [id number]   Chief Complaint  Patient presents with  . Annual Exam  . Hypertension    HPI  She is here today for a full physical examination. She is followed by GYN for her pelvic exams.  She has recently established with a new gynecologist. At this time, she can't recall her name.   Hypertension This is a chronic problem. The current episode started more than 1 year ago. The problem has been gradually improving since onset. The problem is controlled. Pertinent negatives include no blurred vision, chest pain, palpitations or shortness of breath. Risk factors for coronary artery disease include post-menopausal state. The current treatment provides moderate improvement. Compliance problems include exercise.      Past Medical History:  Diagnosis Date  . GERD (gastroesophageal reflux disease)   . Hypertension      Family History  Problem Relation Age of Onset  . Hypertension Mother   . Arthritis Mother   . Glaucoma Mother   . Other Father        unsure of his health  . Breast cancer Maternal Grandmother 50     Current Outpatient Medications:  .  Cholecalciferol (VITAMIN D PO), Take 1 tablet by mouth daily. , Disp: , Rfl:  .  Phentermine-Topiramate (QSYMIA) 3.75-23 MG CP24, Take by mouth., Disp: , Rfl:  .  valsartan-hydrochlorothiazide (DIOVAN-HCT) 80-12.5 MG tablet, TAKE 1 TABLET BY MOUTH EVERY DAY, Disp: 90 tablet, Rfl: 1 .  Phentermine-Topiramate (QSYMIA) 7.5-46 MG CP24, Take 7.5-46 mg by mouth daily., Disp: 30 capsule, Rfl: 1 .  valsartan-hydrochlorothiazide (DIOVAN-HCT) 160-12.5 MG tablet, Take 1 tablet by mouth daily., Disp: , Rfl:    No Known Allergies    The patient states she uses none for birth control. Last LMP was Patient's last menstrual period was 01/02/2019 (exact date).. Negative for Dysmenorrhea @MAMMOFINDINGS@. Negative for: breast discharge, breast lump(s), breast  pain and breast self exam. Associated symptoms include abnormal vaginal bleeding. Pertinent negatives include abnormal bleeding (hematology), anxiety, decreased libido, depression, difficulty falling sleep, dyspareunia, history of infertility, nocturia, sexual dysfunction, sleep disturbances, urinary incontinence, urinary urgency, vaginal discharge and vaginal itching. Diet regular.The patient states her exercise level is    . The patient's tobacco use is:  Social History   Tobacco Use  Smoking Status Current Every Day Smoker  . Packs/day: 0.25  . Years: 15.00  . Pack years: 3.75  . Types: Cigarettes  Smokeless Tobacco Never Used  . She has been exposed to passive smoke. The patient's alcohol use is:  Social History   Substance and Sexual Activity  Alcohol Use Yes  . Additional information: Last pap June 2018.   Review of Systems  Constitutional: Negative.   HENT: Positive for postnasal drip and rhinorrhea.   Eyes: Negative.  Negative for blurred vision.  Respiratory: Negative.  Negative for shortness of breath.   Cardiovascular: Negative.  Negative for chest pain and palpitations.  Endocrine: Negative.   Genitourinary: Negative.   Musculoskeletal: Negative.   Skin: Negative.   Allergic/Immunologic: Negative.   Neurological: Negative.   Hematological: Negative.   Psychiatric/Behavioral: Negative.      Today's Vitals   01/16/19 0938  BP: 118/82  Pulse: 78  Temp: 98.4 F (36.9 C)  TempSrc: Oral  SpO2: 98%  Weight: 200 lb 3.2 oz (90.8 kg)  Height: 5' 6.2" (1.681 m)   Body mass index is   32.12 kg/m.   Objective:  Physical Exam Vitals signs and nursing note reviewed.  Constitutional:      Appearance: Normal appearance. She is obese.  HENT:     Head: Normocephalic and atraumatic.     Right Ear: Tympanic membrane, ear canal and external ear normal.     Left Ear: Tympanic membrane, ear canal and external ear normal.     Nose:     Comments: Deferred, masked     Mouth/Throat:     Mouth: Mucous membranes are moist.     Pharynx: Oropharynx is clear.     Comments: Deferred, masked Eyes:     Extraocular Movements: Extraocular movements intact.     Conjunctiva/sclera: Conjunctivae normal.     Pupils: Pupils are equal, round, and reactive to light.  Neck:     Musculoskeletal: Normal range of motion and neck supple.  Cardiovascular:     Rate and Rhythm: Normal rate and regular rhythm.     Pulses: Normal pulses.     Heart sounds: Normal heart sounds.  Pulmonary:     Effort: Pulmonary effort is normal.     Breath sounds: Normal breath sounds.  Abdominal:     General: Bowel sounds are normal.     Palpations: Abdomen is soft.     Comments: obese  Genitourinary:    Comments: deferred Musculoskeletal: Normal range of motion.  Skin:    General: Skin is warm and dry.     Comments: Tattoo left breast  Neurological:     General: No focal deficit present.     Mental Status: She is alert and oriented to person, place, and time.  Psychiatric:        Mood and Affect: Mood normal.        Behavior: Behavior normal.         Assessment And Plan:     1. Routine general medical examination at health care facility  A full exam was performed.  Importance of monthly self breast exams was discussed with the patient. PATIENT HAS BEEN ADVISED TO GET 30-45 MINUTES REGULAR EXERCISE NO LESS THAN FOUR TO FIVE DAYS PER WEEK - BOTH WEIGHTBEARING EXERCISES AND AEROBIC ARE RECOMMENDED.  SHE WAS ADVISED TO FOLLOW A HEALTHY DIET WITH AT LEAST SIX FRUITS/VEGGIES PER DAY, DECREASE INTAKE OF RED MEAT, AND TO INCREASE FISH INTAKE TO TWO DAYS PER WEEK.  MEATS/FISH SHOULD NOT BE FRIED, BAKED OR BROILED IS PREFERABLE.  I SUGGEST WEARING SPF 50 SUNSCREEN ON EXPOSED PARTS AND ESPECIALLY WHEN IN THE DIRECT SUNLIGHT FOR AN EXTENDED PERIOD OF TIME.  PLEASE AVOID FAST FOOD RESTAURANTS AND INCREASE YOUR WATER INTAKE.  - CMP14+EGFR - Lipid panel - Hemoglobin A1c  2. Essential  hypertension  Chronic, well controlled. She will continue with current meds. She is encouraged to avoid adding salt to her foods. EKG performed, no acute changes noted. She is encouraged to incorporate more exercise into her daily routine.   - POCT Urinalysis Dipstick (81002) - EKG 12-Lead - POCT UA - Microalbumin  3. Allergic rhinitis due to other allergic trigger, unspecified seasonality  Chronic. She was given samples of Zyrtec, 58m to take nightly. She will let me know if her sx improve.   4. Class 1 obesity due to excess calories without serious comorbidity with body mass index (BMI) of 32.0 to 32.9 in adult  She was advised of 11 pound weight gain since June 2020. Importance of regular exercise was discussed with the patient. She is encouraged to aim  for 150 minutes per week. She will be given refill of Qsymia. She will rto in 10 weeks for re-evaluation.   5. Tobacco abuse counseling  Smoking cessation instruction/counseling given:  counseled patient on the dangers of tobacco use, advised patient to stop smoking, and reviewed strategies to maximize success    Maximino Greenland, MD    THE PATIENT IS ENCOURAGED TO PRACTICE SOCIAL DISTANCING DUE TO THE COVID-19 PANDEMIC.

## 2019-01-16 NOTE — Patient Instructions (Signed)
Health Maintenance, Female Adopting a healthy lifestyle and getting preventive care are important in promoting health and wellness. Ask your health care provider about:  The right schedule for you to have regular tests and exams.  Things you can do on your own to prevent diseases and keep yourself healthy. What should I know about diet, weight, and exercise? Eat a healthy diet   Eat a diet that includes plenty of vegetables, fruits, low-fat dairy products, and lean protein.  Do not eat a lot of foods that are high in solid fats, added sugars, or sodium. Maintain a healthy weight Body mass index (BMI) is used to identify weight problems. It estimates body fat based on height and weight. Your health care provider can help determine your BMI and help you achieve or maintain a healthy weight. Get regular exercise Get regular exercise. This is one of the most important things you can do for your health. Most adults should:  Exercise for at least 150 minutes each week. The exercise should increase your heart rate and make you sweat (moderate-intensity exercise).  Do strengthening exercises at least twice a week. This is in addition to the moderate-intensity exercise.  Spend less time sitting. Even light physical activity can be beneficial. Watch cholesterol and blood lipids Have your blood tested for lipids and cholesterol at 45 years of age, then have this test every 5 years. Have your cholesterol levels checked more often if:  Your lipid or cholesterol levels are high.  You are older than 45 years of age.  You are at high risk for heart disease. What should I know about cancer screening? Depending on your health history and family history, you may need to have cancer screening at various ages. This may include screening for:  Breast cancer.  Cervical cancer.  Colorectal cancer.  Skin cancer.  Lung cancer. What should I know about heart disease, diabetes, and high blood  pressure? Blood pressure and heart disease  High blood pressure causes heart disease and increases the risk of stroke. This is more likely to develop in people who have high blood pressure readings, are of African descent, or are overweight.  Have your blood pressure checked: ? Every 3-5 years if you are 18-39 years of age. ? Every year if you are 40 years old or older. Diabetes Have regular diabetes screenings. This checks your fasting blood sugar level. Have the screening done:  Once every three years after age 40 if you are at a normal weight and have a low risk for diabetes.  More often and at a younger age if you are overweight or have a high risk for diabetes. What should I know about preventing infection? Hepatitis B If you have a higher risk for hepatitis B, you should be screened for this virus. Talk with your health care provider to find out if you are at risk for hepatitis B infection. Hepatitis C Testing is recommended for:  Everyone born from 1945 through 1965.  Anyone with known risk factors for hepatitis C. Sexually transmitted infections (STIs)  Get screened for STIs, including gonorrhea and chlamydia, if: ? You are sexually active and are younger than 45 years of age. ? You are older than 45 years of age and your health care provider tells you that you are at risk for this type of infection. ? Your sexual activity has changed since you were last screened, and you are at increased risk for chlamydia or gonorrhea. Ask your health care provider if   you are at risk.  Ask your health care provider about whether you are at high risk for HIV. Your health care provider may recommend a prescription medicine to help prevent HIV infection. If you choose to take medicine to prevent HIV, you should first get tested for HIV. You should then be tested every 3 months for as long as you are taking the medicine. Pregnancy  If you are about to stop having your period (premenopausal) and  you may become pregnant, seek counseling before you get pregnant.  Take 400 to 800 micrograms (mcg) of folic acid every day if you become pregnant.  Ask for birth control (contraception) if you want to prevent pregnancy. Osteoporosis and menopause Osteoporosis is a disease in which the bones lose minerals and strength with aging. This can result in bone fractures. If you are 65 years old or older, or if you are at risk for osteoporosis and fractures, ask your health care provider if you should:  Be screened for bone loss.  Take a calcium or vitamin D supplement to lower your risk of fractures.  Be given hormone replacement therapy (HRT) to treat symptoms of menopause. Follow these instructions at home: Lifestyle  Do not use any products that contain nicotine or tobacco, such as cigarettes, e-cigarettes, and chewing tobacco. If you need help quitting, ask your health care provider.  Do not use street drugs.  Do not share needles.  Ask your health care provider for help if you need support or information about quitting drugs. Alcohol use  Do not drink alcohol if: ? Your health care provider tells you not to drink. ? You are pregnant, may be pregnant, or are planning to become pregnant.  If you drink alcohol: ? Limit how much you use to 0-1 drink a day. ? Limit intake if you are breastfeeding.  Be aware of how much alcohol is in your drink. In the [place] one drink equals one 12 oz bottle of beer (355 mL), one 5 oz glass of wine (148 mL), or one 1 oz glass of hard liquor (44 mL). General instructions  Schedule regular health, dental, and eye exams.  Stay current with your vaccines.  Tell your health care provider if: ? You often feel depressed. ? You have ever been abused or do not feel safe at home. Summary  Adopting a healthy lifestyle and getting preventive care are important in promoting health and wellness.  Follow your health care provider's instructions about healthy  diet, exercising, and getting tested or screened for diseases.  Follow your health care provider's instructions on monitoring your cholesterol and blood pressure. This information is not intended to replace advice given to you by your health care provider. Make sure you discuss any questions you have with your health care provider. Document Released: 09/14/2010 Document Revised: 02/22/2018 Document Reviewed: 02/22/2018 Elsevier Patient Education  2020 [employer]  

## 2019-01-17 LAB — LIPID PANEL
Chol/HDL Ratio: 2.1 ratio (ref 0.0–4.4)
Cholesterol, Total: 188 mg/dL (ref 100–199)
HDL: 88 mg/dL (ref >39)
LDL Chol Calc (NIH): 89 mg/dL (ref 0–99)
Triglycerides: 56 mg/dL (ref 0–149)
VLDL Cholesterol Cal: 11 mg/dL (ref 5–40)

## 2019-01-17 LAB — CMP14+EGFR
ALT: 13 IU/L (ref 0–32)
AST: 24 IU/L (ref 0–40)
Albumin/Globulin Ratio: 1.5 (ref 1.2–2.2)
Albumin: 4.2 g/dL (ref 3.8–4.8)
Alkaline Phosphatase: 57 IU/L (ref 39–117)
BUN/Creatinine Ratio: 13 (ref 9–23)
BUN: 13 mg/dL (ref 6–24)
Bilirubin Total: 0.3 mg/dL (ref 0.0–1.2)
CO2: 22 mmol/L (ref 20–29)
Calcium: 9.1 mg/dL (ref 8.7–10.2)
Chloride: 105 mmol/L (ref 96–106)
Creatinine, Ser: 0.98 mg/dL (ref 0.57–1.00)
GFR calc Af Amer: 81 mL/min/{1.73_m2} (ref >59)
GFR calc non Af Amer: 70 mL/min/{1.73_m2} (ref >59)
Globulin, Total: 2.8 g/dL (ref 1.5–4.5)
Glucose: 96 mg/dL (ref 65–99)
Potassium: 3.9 mmol/L (ref 3.5–5.2)
Sodium: 140 mmol/L (ref 134–144)
Total Protein: 7 g/dL (ref 6.0–8.5)

## 2019-01-17 LAB — HEMOGLOBIN A1C
Est. average glucose Bld gHb Est-mCnc: 91 mg/dL
Hgb A1c MFr Bld: 4.8 % (ref 4.8–5.6)

## 2019-02-19 ENCOUNTER — Encounter: Payer: Self-pay | Admitting: Internal Medicine

## 2019-03-21 ENCOUNTER — Other Ambulatory Visit: Payer: Self-pay | Admitting: Internal Medicine

## 2019-03-27 ENCOUNTER — Ambulatory Visit (INDEPENDENT_AMBULATORY_CARE_PROVIDER_SITE_OTHER): Payer: BC Managed Care – PPO | Admitting: Internal Medicine

## 2019-03-27 ENCOUNTER — Encounter: Payer: Self-pay | Admitting: Internal Medicine

## 2019-03-27 ENCOUNTER — Other Ambulatory Visit: Payer: Self-pay

## 2019-03-27 VITALS — BP 108/72 | HR 74 | Temp 98.9°F | Ht 66.0 in | Wt 202.8 lb

## 2019-03-27 DIAGNOSIS — Z6832 Body mass index (BMI) 32.0-32.9, adult: Secondary | ICD-10-CM

## 2019-03-27 DIAGNOSIS — I10 Essential (primary) hypertension: Secondary | ICD-10-CM

## 2019-03-27 DIAGNOSIS — E6609 Other obesity due to excess calories: Secondary | ICD-10-CM

## 2019-03-27 DIAGNOSIS — Z716 Tobacco abuse counseling: Secondary | ICD-10-CM

## 2019-03-27 DIAGNOSIS — R202 Paresthesia of skin: Secondary | ICD-10-CM | POA: Diagnosis not present

## 2019-03-27 MED ORDER — QSYMIA 7.5-46 MG PO CP24: 7.5000 mg | ORAL_CAPSULE | Freq: Every day | ORAL | 0 refills | Status: DC

## 2019-03-27 NOTE — Progress Notes (Signed)
This visit occurred during the SARS-CoV-2 public health emergency.  Safety protocols were in place, including screening questions prior to the visit, additional usage of staff PPE, and extensive cleaning of exam room while observing appropriate contact time as indicated for disinfecting solutions.  Subjective:     Patient ID: Jessica Rodgers , female    DOB: May 10, 1975 , 44 y.o.   MRN: [id number]   Chief Complaint  Patient presents with  . Hypertension  . Obesity    HPI  She is here today for BP and weight check. She reports compliance with meds. Admits she has not been exercising as much as she had in the past. She feels like working from home has caused her to lose her momentum. She is ready to get back on track. She has been out of Qsymia for at least 3 weeks, she has not heard anything from mail order pharmacy.   Hypertension This is a chronic problem. The current episode started more than 1 year ago. The problem has been gradually improving since onset. The problem is controlled. Pertinent negatives include no blurred vision, chest pain, palpitations or shortness of breath. Past treatments include angiotensin blockers and diuretics. The current treatment provides moderate improvement. Compliance problems include exercise.      Past Medical History:  Diagnosis Date  . GERD (gastroesophageal reflux disease)   . Hypertension      Family History  Problem Relation Age of Onset  . Hypertension Mother   . Arthritis Mother   . Glaucoma Mother   . Other Father        unsure of his health  . Breast cancer Maternal Grandmother 50     Current Outpatient Medications:  .  Cholecalciferol (VITAMIN D PO), Take 1 tablet by mouth daily. , Disp: , Rfl:  .  valsartan-hydrochlorothiazide (DIOVAN-HCT) 80-12.5 MG tablet, TAKE 1 TABLET BY MOUTH EVERY DAY, Disp: 90 tablet, Rfl: 1 .  Phentermine-Topiramate (QSYMIA) 7.5-46 MG CP24, Take 7.5 mg by mouth daily., Disp: 30 capsule, Rfl: 0 .   valsartan-hydrochlorothiazide (DIOVAN-HCT) 160-12.5 MG tablet, Take 1 tablet by mouth daily., Disp: , Rfl:    No Known Allergies   Review of Systems  Constitutional: Negative.   Eyes: Negative for blurred vision.  Respiratory: Negative.  Negative for shortness of breath.   Cardiovascular: Negative.  Negative for chest pain and palpitations.  Gastrointestinal: Negative.   Neurological: Positive for numbness.       She reports the numbness has decreased in her fingers. She is not sure why.   Psychiatric/Behavioral: Negative.      Today's Vitals   03/27/19 1034  BP: 108/72  Pulse: 74  Temp: 98.9 F (37.2 C)  TempSrc: Oral  Weight: 202 lb 12.8 oz (92 kg)  Height: 5\' 6"  (1.676 m)   Body mass index is 32.73 kg/m.   Objective:  Physical Exam Vitals and nursing note reviewed.  Constitutional:      Appearance: Normal appearance.  HENT:     Head: Normocephalic and atraumatic.  Cardiovascular:     Rate and Rhythm: Normal rate and regular rhythm.     Heart sounds: Normal heart sounds.  Pulmonary:     Effort: Pulmonary effort is normal.     Breath sounds: Normal breath sounds.  Skin:    General: Skin is warm.  Neurological:     General: No focal deficit present.     Mental Status: She is alert.  Psychiatric:        Mood  and Affect: Mood normal.        Behavior: Behavior normal.         Assessment And Plan:     1. Essential hypertension  Chronic, well controlled. She will continue with current meds.   2. Paresthesia of finger  Resolving. Pt advised that her sx may return when she resumes Qsymia. Pt reminded topiramate is known to cause paresthesias.   3. Class 1 obesity due to excess calories without serious comorbidity with body mass index (BMI) of 32.0 to 32.9 in adult  I will increase dose of Qsymia to 7.5mg  daily. She will rto in 8-10 weeks for re-evaluation. She is also encouraged to incorporate more exercise into her daily routine.   4. Tobacco abuse  counseling  She is not ready to quit at this time. She has cut back to 1/2 ppd and was commended for her efforts.   Maximino Greenland, MD    THE PATIENT IS ENCOURAGED TO PRACTICE SOCIAL DISTANCING DUE TO THE COVID-19 PANDEMIC.

## 2019-03-27 NOTE — Patient Instructions (Signed)

## 2019-06-06 ENCOUNTER — Ambulatory Visit (INDEPENDENT_AMBULATORY_CARE_PROVIDER_SITE_OTHER): Payer: BC Managed Care – PPO | Admitting: Internal Medicine

## 2019-06-06 ENCOUNTER — Other Ambulatory Visit: Payer: Self-pay

## 2019-06-06 ENCOUNTER — Encounter: Payer: Self-pay | Admitting: Internal Medicine

## 2019-06-06 VITALS — BP 118/76 | HR 74 | Temp 98.4°F | Ht 66.0 in | Wt 202.0 lb

## 2019-06-06 DIAGNOSIS — Z6832 Body mass index (BMI) 32.0-32.9, adult: Secondary | ICD-10-CM | POA: Diagnosis not present

## 2019-06-06 DIAGNOSIS — I10 Essential (primary) hypertension: Secondary | ICD-10-CM | POA: Diagnosis not present

## 2019-06-06 DIAGNOSIS — J301 Allergic rhinitis due to pollen: Secondary | ICD-10-CM | POA: Diagnosis not present

## 2019-06-06 DIAGNOSIS — E6609 Other obesity due to excess calories: Secondary | ICD-10-CM

## 2019-06-06 MED ORDER — VALSARTAN-HYDROCHLOROTHIAZIDE 80-12.5 MG PO TABS: 1.0000 | ORAL_TABLET | Freq: Every day | ORAL | 1 refills | Status: DC

## 2019-06-06 NOTE — Progress Notes (Signed)
This visit occurred during the SARS-CoV-2 public health emergency.  Safety protocols were in place, including screening questions prior to the visit, additional usage of staff PPE, and extensive cleaning of exam room while observing appropriate contact time as indicated for disinfecting solutions.  Subjective:     Patient ID: Jessica Rodgers , female    DOB: 20-Sep-1975 , 44 y.o.   MRN: [id number]   Chief Complaint  Patient presents with  . Hypertension  . Obesity    HPI  She presents today for BP/Weight check. She reports compliance with BP meds. She has been out of Qsymia for two weeks. Currently waiting on a new shipment. Admits she is not exercising regularly.   Hypertension This is a chronic problem. The current episode started more than 1 year ago. The problem has been gradually improving since onset. The problem is controlled. Pertinent negatives include no blurred vision, chest pain, palpitations or shortness of breath. Risk factors for coronary artery disease include obesity, sedentary lifestyle and smoking/tobacco exposure. Past treatments include angiotensin blockers.     Past Medical History:  Diagnosis Date  . GERD (gastroesophageal reflux disease)   . Hypertension      Family History  Problem Relation Age of Onset  . Hypertension Mother   . Arthritis Mother   . Glaucoma Mother   . Other Father        unsure of his health  . Breast cancer Maternal Grandmother 50     Current Outpatient Medications:  .  Cholecalciferol (VITAMIN D PO), Take 1 tablet by mouth daily. , Disp: , Rfl:  .  Phentermine-Topiramate (QSYMIA) 7.5-46 MG CP24, Take 7.5 mg by mouth daily., Disp: 30 capsule, Rfl: 0 .  valsartan-hydrochlorothiazide (DIOVAN-HCT) 80-12.5 MG tablet, Take 1 tablet by mouth daily., Disp: 90 tablet, Rfl: 1   No Known Allergies   Review of Systems  Constitutional: Negative.   HENT: Positive for congestion.        She states the pollen has started to affect her  allergies. She gets relief with Mucinex nasal spray and oral antihistamines.   Eyes: Negative for blurred vision.  Respiratory: Negative for shortness of breath.   Cardiovascular: Negative.  Negative for chest pain and palpitations.  Gastrointestinal: Negative.   Neurological: Negative.   Psychiatric/Behavioral: Negative.      Today's Vitals   06/06/19 0850  BP: 118/76  Pulse: 74  Temp: 98.4 F (36.9 C)  TempSrc: Oral  Weight: 202 lb (91.6 kg)  Height: 5\' 6"  (1.676 m)   Body mass index is 32.6 kg/m.   Objective:  Physical Exam Vitals and nursing note reviewed.  Constitutional:      Appearance: Normal appearance. She is obese.  HENT:     Head: Normocephalic and atraumatic.  Cardiovascular:     Rate and Rhythm: Normal rate and regular rhythm.     Heart sounds: Normal heart sounds.  Pulmonary:     Effort: Pulmonary effort is normal.     Breath sounds: Normal breath sounds.  Skin:    General: Skin is warm.  Neurological:     General: No focal deficit present.     Mental Status: She is alert.  Psychiatric:        Mood and Affect: Mood normal.        Behavior: Behavior normal.         Assessment And Plan:     1. Essential hypertension  Chronic, well controlled. She will continue with current meds.  2. Class 1 obesity due to excess calories with serious comorbidity and body mass index (BMI) of 32.0 to 32.9 in adult  She is encouraged to strive for BMI less than 30 to decrease cardiac risk. She will continue with current dose of Qsymia. She is encouraged to resume her regular walking routine. She will rto in 8-10 weeks for re-evaluation.   3. Seasonal allergic rhinitis due to pollen  She will continue with fexofenadine daily. She is advised to consider using Ayr gel prior to going out for walks, to decrease irritants getting into her nose.   Maximino Greenland, MD    THE PATIENT IS ENCOURAGED TO PRACTICE SOCIAL DISTANCING DUE TO THE COVID-19 PANDEMIC.

## 2019-08-21 ENCOUNTER — Ambulatory Visit: Payer: BC Managed Care – PPO | Admitting: Internal Medicine

## 2019-09-11 ENCOUNTER — Other Ambulatory Visit (HOSPITAL_COMMUNITY)
Admission: RE | Admit: 2019-09-11 | Discharge: 2019-09-11 | Disposition: A | Discharge status: Home / Self Care | Payer: BC Managed Care – PPO | Source: Ambulatory Visit | Attending: Nurse Practitioner | Admitting: Nurse Practitioner

## 2019-09-11 ENCOUNTER — Encounter: Payer: Self-pay | Admitting: Internal Medicine

## 2019-09-11 ENCOUNTER — Ambulatory Visit (INDEPENDENT_AMBULATORY_CARE_PROVIDER_SITE_OTHER): Payer: BC Managed Care – PPO | Admitting: Nurse Practitioner

## 2019-09-11 ENCOUNTER — Other Ambulatory Visit: Payer: Self-pay

## 2019-09-11 ENCOUNTER — Encounter: Payer: Self-pay | Admitting: Nurse Practitioner

## 2019-09-11 VITALS — BP 116/72 | HR 100 | Temp 98.9°F | Ht 62.2 in | Wt 197.2 lb

## 2019-09-11 DIAGNOSIS — R350 Frequency of micturition: Secondary | ICD-10-CM

## 2019-09-11 DIAGNOSIS — R300 Dysuria: Secondary | ICD-10-CM | POA: Diagnosis not present

## 2019-09-11 LAB — POCT URINALYSIS DIPSTICK
Bilirubin, UA: NEGATIVE
Blood, UA: NEGATIVE
Glucose, UA: NEGATIVE
Ketones, UA: NEGATIVE
Nitrite, UA: NEGATIVE
Protein, UA: NEGATIVE
Spec Grav, UA: 1.02 (ref 1.010–1.025)
Urobilinogen, UA: 0.2 E.U./dL
pH, UA: 7 (ref 5.0–8.0)

## 2019-09-11 MED ORDER — NITROFURANTOIN MONOHYD MACRO 100 MG PO CAPS: 100.0000 mg | ORAL_CAPSULE | Freq: Two times a day (BID) | ORAL | 0 refills | Status: AC

## 2019-09-11 NOTE — Progress Notes (Signed)
This visit occurred during the SARS-CoV-2 public health emergency.  Safety protocols were in place, including screening questions prior to the visit, additional usage of staff PPE, and extensive cleaning of exam room while observing appropriate contact time as indicated for disinfecting solutions.  Subjective:     Patient ID: Jessica Rodgers , female    DOB: 1975-11-19 , 44 y.o.   MRN: [id number]   Chief Complaint  Patient presents with  . Dysuria    HPI  She has changed her soap over the weekend - she had been using an oil of olay liquid soap from [employer]. She is having burning sensation when she urinates. She is drinking water and cranberry juice.  She took AZO with good relief.  Continues to have irritation.   Dysuria  This is a new problem. The current episode started in the past 7 days. There has been no fever. She is sexually active. There is no history of pyelonephritis. Pertinent negatives include no chills, flank pain, hesitancy, nausea or possible pregnancy. Associated symptoms comments: Tubal clipping . She has tried home medications for the symptoms. Her past medical history is significant for recurrent UTIs.     Past Medical History:  Diagnosis Date  . GERD (gastroesophageal reflux disease)   . Hypertension      Family History  Problem Relation Age of Onset  . Hypertension Mother   . Arthritis Mother   . Glaucoma Mother   . Other Father        unsure of his health  . Breast cancer Maternal Grandmother 50     Current Outpatient Medications:  .  Cholecalciferol (VITAMIN D PO), Take 1 tablet by mouth daily. , Disp: , Rfl:  .  Phentermine-Topiramate (QSYMIA) 7.5-46 MG CP24, Take 7.5 mg by mouth daily., Disp: 30 capsule, Rfl: 0 .  valsartan-hydrochlorothiazide (DIOVAN-HCT) 80-12.5 MG tablet, Take 1 tablet by mouth daily., Disp: 90 tablet, Rfl: 1   No Known Allergies   Review of Systems  Constitutional: Negative for chills.  Respiratory: Negative.    Cardiovascular: Negative.  Negative for chest pain, palpitations and leg swelling.  Gastrointestinal: Negative for nausea.  Genitourinary: Positive for dysuria. Negative for flank pain and hesitancy.  Neurological: Negative for dizziness and headaches.  Psychiatric/Behavioral: Negative.      Today's Vitals   09/11/19 1213  BP: 116/72  Pulse: 100  Temp: 98.9 F (37.2 C)  TempSrc: Oral  Weight: 197 lb 3.2 oz (89.4 kg)  Height: 5' 2.2" (1.58 m)   Body mass index is 35.84 kg/m.   Objective:  Physical Exam Constitutional:      Appearance: Normal appearance.  Cardiovascular:     Rate and Rhythm: Normal rate and regular rhythm.  Neurological:     General: No focal deficit present.     Mental Status: She is alert and oriented to person, place, and time.     Cranial Nerves: No cranial nerve deficit.  Psychiatric:        Mood and Affect: Mood normal.        Behavior: Behavior normal.        Thought Content: Thought content normal.        Judgment: Judgment normal.         Assessment And Plan:      1. Frequency of urination  Trace leukocytes will send urine for culture - POCT Urinalysis Dipstick (93790) - Urine cytology ancillary only - nitrofurantoin, macrocrystal-monohydrate, (MACROBID) 100 MG capsule; Take 1 capsule (100 mg  total) by mouth 2 (two) times daily for 5 days.  Dispense: 10 capsule; Refill: 0 - Culture, Urine  2. Dysuria  Will check urine cytology for bacterial vaginosis  Also discussed avoiding cleaning vaginal area with soaps with glycerin.  - Urine cytology ancillary only - nitrofurantoin, macrocrystal-monohydrate, (MACROBID) 100 MG capsule; Take 1 capsule (100 mg total) by mouth 2 (two) times daily for 5 days.  Dispense: 10 capsule; Refill: 0 - Culture, Urine  Minette Brine, FNP    THE PATIENT IS ENCOURAGED TO PRACTICE SOCIAL DISTANCING DUE TO THE COVID-19 PANDEMIC.

## 2019-09-13 LAB — URINE CYTOLOGY ANCILLARY ONLY: Bacterial Vaginitis-Urine: NEGATIVE

## 2019-09-24 ENCOUNTER — Encounter: Payer: Self-pay | Admitting: Internal Medicine

## 2019-09-25 ENCOUNTER — Ambulatory Visit (INDEPENDENT_AMBULATORY_CARE_PROVIDER_SITE_OTHER): Payer: BC Managed Care – PPO | Admitting: Internal Medicine

## 2019-09-25 ENCOUNTER — Other Ambulatory Visit: Payer: Self-pay

## 2019-09-25 ENCOUNTER — Encounter: Payer: Self-pay | Admitting: Internal Medicine

## 2019-09-25 VITALS — BP 128/72 | HR 87 | Temp 98.3°F | Ht 62.2 in | Wt 197.8 lb

## 2019-09-25 DIAGNOSIS — I10 Essential (primary) hypertension: Secondary | ICD-10-CM | POA: Diagnosis not present

## 2019-09-25 DIAGNOSIS — Z6835 Body mass index (BMI) 35.0-35.9, adult: Secondary | ICD-10-CM

## 2019-09-25 LAB — BMP8+EGFR
BUN/Creatinine Ratio: 15 (ref 9–23)
BUN: 14 mg/dL (ref 6–24)
CO2: 24 mmol/L (ref 20–29)
Calcium: 9.5 mg/dL (ref 8.7–10.2)
Chloride: 105 mmol/L (ref 96–106)
Creatinine, Ser: 0.94 mg/dL (ref 0.57–1.00)
GFR calc Af Amer: 84 mL/min/{1.73_m2} (ref >59)
GFR calc non Af Amer: 73 mL/min/{1.73_m2} (ref >59)
Glucose: 94 mg/dL (ref 65–99)
Potassium: 4.3 mmol/L (ref 3.5–5.2)
Sodium: 140 mmol/L (ref 134–144)

## 2019-09-25 NOTE — Progress Notes (Signed)
Oneida Alar Badgett,acting as a scribe for Maximino Greenland, MD.,have documented all relevant documentation on the behalf of Maximino Greenland, MD,as directed by  Maximino Greenland, MD while in the presence of Maximino Greenland, MD.   This visit occurred during the SARS-CoV-2 public health emergency.  Safety protocols were in place, including screening questions prior to the visit, additional usage of staff PPE, and extensive cleaning of exam room while observing appropriate contact time as indicated for disinfecting solutions.  Subjective:     Patient ID: Jessica Rodgers , female    DOB: [birth year] , 44 y.o.   MRN: [id number]   Chief Complaint  Patient presents with  . Hypertension  . Obesity    HPI  She presents today for BP check and f/u obesity. She reports compliance with BP meds. She wants to eventually stop this medication if possible. She admits that she is not exercising regularly due to the heat. She is still taking Qsymia 7.5mg  daily.   Hypertension This is a chronic problem. The current episode started more than 1 year ago. The problem has been gradually improving since onset. The problem is controlled. Pertinent negatives include no blurred vision, chest pain, palpitations or shortness of breath. Past treatments include ACE inhibitors, angiotensin blockers and diuretics. The current treatment provides moderate improvement.     Past Medical History:  Diagnosis Date  . GERD (gastroesophageal reflux disease)   . Hypertension      Family History  Problem Relation Age of Onset  . Hypertension Mother   . Arthritis Mother   . Glaucoma Mother   . Other Father        unsure of his health  . Breast cancer Maternal Grandmother 50     Current Outpatient Medications:  .  Cholecalciferol (VITAMIN D PO), Take 1 tablet by mouth daily. , Disp: , Rfl:  .  Phentermine-Topiramate (QSYMIA) 7.5-46 MG CP24, Take 7.5 mg by mouth daily., Disp: 30 capsule, Rfl: 0 .   valsartan-hydrochlorothiazide (DIOVAN-HCT) 80-12.5 MG tablet, Take 1 tablet by mouth daily., Disp: 90 tablet, Rfl: 1   No Known Allergies   Review of Systems  Constitutional: Negative.   Eyes: Negative for blurred vision.  Respiratory: Negative.  Negative for shortness of breath.   Cardiovascular: Negative.  Negative for chest pain and palpitations.  Gastrointestinal: Negative.   Neurological: Negative.      Today's Vitals   09/25/19 0942  BP: 128/72  Pulse: 87  Temp: 98.3 F (36.8 C)  TempSrc: Oral  Weight: 197 lb 12.8 oz (89.7 kg)  Height: 5' 2.2" (1.58 m)   Body mass index is 35.95 kg/m.   Objective:  Physical Exam Vitals and nursing note reviewed.  Constitutional:      Appearance: Normal appearance. She is obese.  HENT:     Head: Normocephalic and atraumatic.  Cardiovascular:     Rate and Rhythm: Normal rate and regular rhythm.     Heart sounds: Normal heart sounds.  Pulmonary:     Effort: Pulmonary effort is normal.     Breath sounds: Normal breath sounds.  Skin:    General: Skin is warm.  Neurological:     General: No focal deficit present.     Mental Status: She is alert.  Psychiatric:        Mood and Affect: Mood normal.        Behavior: Behavior normal.         Assessment And Plan:  1. Essential hypertension  Chronic, well controlled. She will continue with current meds. I will check renal function today. She will rto in four months for a physical examination.   2. Class 1 obesity due to excess calories without serious comorbidity with body mass index (BMI) of 32.0 to 32.9 in adult  She will continue with Qsymia 7.5mg  daily. Advised to incorporate more exercise into her daily routine. Encouraged to aim for at least 150 minutes of exercise per week. She will rto in 8 weeks for re-evaluation.   Maximino Greenland, MD   I, Maximino Greenland, MD, have reviewed all documentation for this visit. The documentation on 09/25/19 for the exam, diagnosis,  procedures, and orders are all accurate and complete.  THE PATIENT IS ENCOURAGED TO PRACTICE SOCIAL DISTANCING DUE TO THE COVID-19 PANDEMIC.

## 2019-09-25 NOTE — Patient Instructions (Signed)

## 2019-10-15 ENCOUNTER — Other Ambulatory Visit: Payer: Self-pay | Admitting: Internal Medicine

## 2019-10-15 DIAGNOSIS — Z1231 Encounter for screening mammogram for malignant neoplasm of breast: Secondary | ICD-10-CM

## 2019-10-31 ENCOUNTER — Ambulatory Visit
Admission: RE | Admit: 2019-10-31 | Discharge: 2019-10-31 | Disposition: A | Discharge status: Home / Self Care | Payer: BC Managed Care – PPO | Source: Ambulatory Visit | Attending: Internal Medicine | Admitting: Internal Medicine

## 2019-10-31 ENCOUNTER — Other Ambulatory Visit: Payer: Self-pay

## 2019-10-31 DIAGNOSIS — Z1231 Encounter for screening mammogram for malignant neoplasm of breast: Secondary | ICD-10-CM

## 2019-11-02 ENCOUNTER — Other Ambulatory Visit: Payer: Self-pay | Admitting: Internal Medicine

## 2019-11-02 DIAGNOSIS — R928 Other abnormal and inconclusive findings on diagnostic imaging of breast: Secondary | ICD-10-CM

## 2019-11-08 ENCOUNTER — Encounter: Payer: Self-pay | Admitting: Internal Medicine

## 2019-11-08 ENCOUNTER — Other Ambulatory Visit: Payer: Self-pay

## 2019-11-08 NOTE — Progress Notes (Signed)
Error

## 2019-11-14 ENCOUNTER — Ambulatory Visit: Payer: BC Managed Care – PPO

## 2019-11-14 ENCOUNTER — Other Ambulatory Visit: Payer: Self-pay

## 2019-11-14 ENCOUNTER — Ambulatory Visit
Admission: RE | Admit: 2019-11-14 | Discharge: 2019-11-14 | Disposition: A | Discharge status: Home / Self Care | Payer: BC Managed Care – PPO | Source: Ambulatory Visit | Attending: Internal Medicine | Admitting: Internal Medicine

## 2019-11-14 DIAGNOSIS — R928 Other abnormal and inconclusive findings on diagnostic imaging of breast: Secondary | ICD-10-CM

## 2019-11-20 ENCOUNTER — Ambulatory Visit (INDEPENDENT_AMBULATORY_CARE_PROVIDER_SITE_OTHER): Payer: BC Managed Care – PPO

## 2019-11-20 ENCOUNTER — Other Ambulatory Visit: Payer: Self-pay

## 2019-11-20 ENCOUNTER — Telehealth: Payer: Self-pay

## 2019-11-20 VITALS — BP 114/78 | HR 61 | Temp 98.1°F | Ht 62.2 in | Wt 196.8 lb

## 2019-11-20 DIAGNOSIS — Z6835 Body mass index (BMI) 35.0-35.9, adult: Secondary | ICD-10-CM

## 2019-11-20 NOTE — Telephone Encounter (Signed)
The pt brought a form to be exempt from taking a covid vaccination.  The pt said because of her blood pressure and vitd level that she didn't feel comfortable taking the vaccine because she doesn't know what the side affects would be.  The pt was told that Dr. Baird [facility] said she has to talk to someone at [facility] because the pt's blood pressure or vitd level wouldn't exempt the pt from getting a vaccination.

## 2020-01-03 ENCOUNTER — Encounter: Payer: Self-pay | Admitting: Internal Medicine

## 2020-01-21 ENCOUNTER — Encounter: Payer: BC Managed Care – PPO | Admitting: Internal Medicine

## 2020-01-21 ENCOUNTER — Encounter: Payer: Self-pay | Admitting: Internal Medicine

## 2020-01-21 NOTE — Progress Notes (Signed)
Erroneous, pt cancelled appt

## 2020-02-06 ENCOUNTER — Encounter: Payer: Self-pay | Admitting: Nurse Practitioner

## 2020-02-06 ENCOUNTER — Other Ambulatory Visit: Payer: Self-pay

## 2020-02-06 ENCOUNTER — Ambulatory Visit (INDEPENDENT_AMBULATORY_CARE_PROVIDER_SITE_OTHER): Payer: BC Managed Care – PPO | Admitting: Nurse Practitioner

## 2020-02-06 VITALS — BP 118/76 | HR 75 | Temp 98.0°F | Ht 62.6 in | Wt 197.2 lb

## 2020-02-06 DIAGNOSIS — Z2821 Immunization not carried out because of patient refusal: Secondary | ICD-10-CM

## 2020-02-06 DIAGNOSIS — Z6835 Body mass index (BMI) 35.0-35.9, adult: Secondary | ICD-10-CM

## 2020-02-06 DIAGNOSIS — I10 Essential (primary) hypertension: Secondary | ICD-10-CM | POA: Diagnosis not present

## 2020-02-06 DIAGNOSIS — Z716 Tobacco abuse counseling: Secondary | ICD-10-CM | POA: Diagnosis not present

## 2020-02-06 LAB — BMP8+EGFR
BUN/Creatinine Ratio: 18 (ref 9–23)
BUN: 17 mg/dL (ref 6–24)
CO2: 21 mmol/L (ref 20–29)
Calcium: 9.9 mg/dL (ref 8.7–10.2)
Chloride: 104 mmol/L (ref 96–106)
Creatinine, Ser: 0.95 mg/dL (ref 0.57–1.00)
GFR calc Af Amer: 83 mL/min/{1.73_m2} (ref >59)
GFR calc non Af Amer: 72 mL/min/{1.73_m2} (ref >59)
Glucose: 105 mg/dL — ABNORMAL HIGH (ref 65–99)
Potassium: 4.3 mmol/L (ref 3.5–5.2)
Sodium: 138 mmol/L (ref 134–144)

## 2020-02-06 NOTE — Patient Instructions (Addendum)

## 2020-02-06 NOTE — Progress Notes (Signed)
Rutherford Nail as a scribe for Minette Brine, FNP.,have documented all relevant documentation on the behalf of Minette Brine, FNP,as directed by  Minette Brine, FNP while in the presence of Minette Brine, [place].  This visit occurred during the SARS-CoV-2 public health emergency.  Safety protocols were in place, including screening questions prior to the visit, additional usage of staff PPE, and extensive cleaning of exam room while observing appropriate contact time as indicated for disinfecting solutions.  Subjective:     Patient ID: Jessica Rodgers , female    DOB: 1975-05-05 , 44 y.o.   MRN: [id number]   Chief Complaint  Patient presents with  . Weight Check  . Hypertension    HPI  Pt here for weight check. She is taking Qsymia low dose. She is not exercising as much. She does not eat out often.    Wt Readings from Last 3 Encounters: 02/06/20 : 197 lb 3.2 oz (89.4 kg) 11/20/19 : 196 lb 12.8 oz (89.3 kg) 09/25/19 : 197 lb 12.8 oz (89.7 kg)   Hypertension This is a chronic problem. The current episode started more than 1 year ago. The problem has been gradually improving since onset. The problem is controlled. Pertinent negatives include no blurred vision, chest pain, headaches, palpitations or shortness of breath. Past treatments include ACE inhibitors, angiotensin blockers and diuretics. The current treatment provides moderate improvement.     Past Medical History:  Diagnosis Date  . GERD (gastroesophageal reflux disease)   . Hypertension      Family History  Problem Relation Age of Onset  . Hypertension Mother   . Arthritis Mother   . Glaucoma Mother   . Other Father        unsure of his health  . Breast cancer Maternal Grandmother 50     Current Outpatient Medications:  .  Cholecalciferol (VITAMIN D PO), Take 1 tablet by mouth daily. 5,000u, Disp: , Rfl:  .  Phentermine-Topiramate (QSYMIA) 7.5-46 MG CP24, Take 7.5 mg by mouth daily., Disp: 30 capsule, Rfl:  0 .  valsartan-hydrochlorothiazide (DIOVAN-HCT) 80-12.5 MG tablet, Take 1 tablet by mouth daily., Disp: 90 tablet, Rfl: 1   No Known Allergies   Review of Systems  Constitutional: Negative.  Negative for fatigue.  HENT: Negative.   Eyes: Negative for blurred vision.  Respiratory: Negative.  Negative for shortness of breath.   Cardiovascular: Negative for chest pain, palpitations and leg swelling.  Endocrine: Negative for polydipsia, polyphagia and polyuria.  Musculoskeletal: Negative.   Skin: Negative.   Neurological: Negative for dizziness and headaches.  Psychiatric/Behavioral: Negative.      Today's Vitals   02/06/20 1035  BP: 118/76  Pulse: 75  Temp: 98 F (36.7 C)  TempSrc: Oral  Weight: 197 lb 3.2 oz (89.4 kg)  Height: 5' 2.6" (1.59 m)   Body mass index is 35.38 kg/m.  Wt Readings from Last 3 Encounters:  02/06/20 197 lb 3.2 oz (89.4 kg)  11/20/19 196 lb 12.8 oz (89.3 kg)  09/25/19 197 lb 12.8 oz (89.7 kg)     Objective:  Physical Exam Vitals reviewed.  Constitutional:      General: She is not in acute distress.    Appearance: Normal appearance. She is obese.  Cardiovascular:     Rate and Rhythm: Normal rate.     Pulses: Normal pulses.     Heart sounds: Normal heart sounds. No murmur heard.   Pulmonary:     Effort: Pulmonary effort is normal. No respiratory distress.  Breath sounds: Normal breath sounds. No wheezing.  Neurological:     General: No focal deficit present.     Mental Status: She is alert and oriented to person, place, and time.     Cranial Nerves: No cranial nerve deficit.  Psychiatric:        Mood and Affect: Mood normal.        Behavior: Behavior normal.        Thought Content: Thought content normal.        Judgment: Judgment normal.         Assessment And Plan:     1. Essential hypertension  Chronic, well controlled  Continue with current medications - BMP8+eGFR  2. Class 2 severe obesity due to excess calories with  serious comorbidity and body mass index (BMI) of 35.0 to 35.9 in adult [facility])  Chronic  Discussed healthy diet and regular exercise options   Encouraged to exercise at least 150 minutes per week with 2 days of strength training  Continue with phentermine  Weight is stable Wt Readings from Last 3 Encounters:  02/06/20 197 lb 3.2 oz (89.4 kg)  11/20/19 196 lb 12.8 oz (89.3 kg)  09/25/19 197 lb 12.8 oz (89.7 kg)     3. Influenza vaccination declined  Influenza immunization was not given due to patient refusal.  4. COVID-19 vaccination declined  She declines information regarding covid vaccine.   Declines covid 19 vaccine. Discussed risk of covid 24 and if she changes her mind about the vaccine to call the office.  Encouraged to take multivitamin, vitamin d, vitamin c and zinc to increase immune system. Aware can call office if would like to have vaccine here at office.    5. Tobacco abuse counseling Ready to quit: No Counseling given: Yes Comment: Try to decrease number of cigs smoked  Smoking cessation instruction/counseling given:  counseled patient on the dangers of tobacco use, advised patient to stop smoking, and reviewed strategies to maximize success   Patient was given opportunity to ask questions. Patient verbalized understanding of the plan and was able to repeat key elements of the plan. All questions were answered to their satisfaction.   Teola Bradley, FNP, have reviewed all documentation for this visit. The documentation on 02/10/20 for the exam, diagnosis, procedures, and orders are all accurate and complete.   THE PATIENT IS ENCOURAGED TO PRACTICE SOCIAL DISTANCING DUE TO THE COVID-19 PANDEMIC.

## 2020-04-08 ENCOUNTER — Ambulatory Visit: Payer: BC Managed Care – PPO | Admitting: Internal Medicine

## 2020-04-09 ENCOUNTER — Ambulatory Visit (INDEPENDENT_AMBULATORY_CARE_PROVIDER_SITE_OTHER): Payer: BC Managed Care – PPO | Admitting: Internal Medicine

## 2020-04-09 ENCOUNTER — Other Ambulatory Visit: Payer: Self-pay

## 2020-04-09 ENCOUNTER — Encounter: Payer: Self-pay | Admitting: Internal Medicine

## 2020-04-09 VITALS — BP 122/68 | HR 78 | Temp 98.3°F | Ht 62.6 in | Wt 205.0 lb

## 2020-04-09 DIAGNOSIS — I10 Essential (primary) hypertension: Secondary | ICD-10-CM

## 2020-04-09 DIAGNOSIS — Z1211 Encounter for screening for malignant neoplasm of colon: Secondary | ICD-10-CM

## 2020-04-09 DIAGNOSIS — Z6836 Body mass index (BMI) 36.0-36.9, adult: Secondary | ICD-10-CM | POA: Diagnosis not present

## 2020-04-09 NOTE — Patient Instructions (Signed)

## 2020-04-09 NOTE — Progress Notes (Signed)
I,Katawbba Wiggins,acting as a Education administrator for Maximino Greenland, MD.,have documented all relevant documentation on the behalf of Maximino Greenland, MD,as directed by  Maximino Greenland, MD while in the presence of Maximino Greenland, MD.  This visit occurred during the SARS-CoV-2 public health emergency.  Safety protocols were in place, including screening questions prior to the visit, additional usage of staff PPE, and extensive cleaning of exam room while observing appropriate contact time as indicated for disinfecting solutions.  Subjective:     Patient ID: Jessica Rodgers , female    DOB: 1975/04/17 , 45 y.o.   MRN: [id number]   Chief Complaint  Patient presents with  . Hypertension  . Obesity    HPI  She presents today for BP check and f/u obesity. She reports compliance with meds. Admits she is not exercising regularly.   Hypertension This is a chronic problem. The current episode started more than 1 year ago. The problem has been gradually improving since onset. The problem is controlled. Pertinent negatives include no blurred vision, chest pain, palpitations or shortness of breath. Past treatments include ACE inhibitors, angiotensin blockers and diuretics. The current treatment provides moderate improvement.     Past Medical History:  Diagnosis Date  . GERD (gastroesophageal reflux disease)   . Hypertension      Family History  Problem Relation Age of Onset  . Hypertension Mother   . Arthritis Mother   . Glaucoma Mother   . Other Father        unsure of his health  . Breast cancer Maternal Grandmother 50     Current Outpatient Medications:  .  Cholecalciferol (VITAMIN D PO), Take 1 tablet by mouth daily. 5,000u, Disp: , Rfl:  .  Phentermine-Topiramate (QSYMIA) 7.5-46 MG CP24, Take 7.5 mg by mouth daily., Disp: 30 capsule, Rfl: 0 .  valsartan-hydrochlorothiazide (DIOVAN-HCT) 80-12.5 MG tablet, Take 1 tablet by mouth daily., Disp: 90 tablet, Rfl: 1   No Known Allergies    Review of Systems  Constitutional: Negative.   Eyes: Negative for blurred vision.  Respiratory: Negative.  Negative for shortness of breath.   Cardiovascular: Negative.  Negative for chest pain and palpitations.  Gastrointestinal: Negative.   Psychiatric/Behavioral: Negative.   All other systems reviewed and are negative.    Today's Vitals   04/09/20 0918  BP: 122/68  Pulse: 78  Temp: 98.3 F (36.8 C)  TempSrc: Oral  SpO2: 98%  Weight: 205 lb (93 kg)  Height: 5' 2.6" (1.59 m)   Body mass index is 36.78 kg/m.  Wt Readings from Last 3 Encounters:  04/09/20 205 lb (93 kg)  02/06/20 197 lb 3.2 oz (89.4 kg)  11/20/19 196 lb 12.8 oz (89.3 kg)   Objective:  Physical Exam Vitals and nursing note reviewed.  Constitutional:      Appearance: Normal appearance. She is obese.  HENT:     Head: Normocephalic and atraumatic.  Cardiovascular:     Rate and Rhythm: Normal rate and regular rhythm.     Heart sounds: Normal heart sounds.  Pulmonary:     Breath sounds: Normal breath sounds.  Skin:    General: Skin is warm.  Neurological:     General: No focal deficit present.     Mental Status: She is alert and oriented to person, place, and time.         Assessment And Plan:     1. Essential hypertension Comments: Chronic, well controlled. She will c/w valsartan/hctz. She will rto  in April 2022 for a full physical exam.  I will check renal function at her next visit.   2. Class 2 severe obesity due to excess calories with serious comorbidity and body mass index (BMI) of 36.0 to 36.9 in adult [facility]) Comments: She was advised of 8 pound weight gain. Encouraged to resume her regular exercise regimen. I also suggest increasing dose of Qsymia to 11.25mg  qd.   3. Screen for colon cancer Comments: I will refer her to GI for CRC screening.  - Ambulatory referral to Gastroenterology  Patient was given opportunity to ask questions. Patient verbalized understanding of the plan and was  able to repeat key elements of the plan. All questions were answered to their satisfaction.  Maximino Greenland, MD   I, Maximino Greenland, MD, have reviewed all documentation for this visit. The documentation on 04/09/20 for the exam, diagnosis, procedures, and orders are all accurate and complete.  THE PATIENT IS ENCOURAGED TO PRACTICE SOCIAL DISTANCING DUE TO THE COVID-19 PANDEMIC.

## 2020-04-14 ENCOUNTER — Telehealth: Payer: Self-pay

## 2020-04-14 NOTE — Telephone Encounter (Signed)
error 

## 2020-04-25 ENCOUNTER — Other Ambulatory Visit: Payer: Self-pay | Admitting: Internal Medicine

## 2020-05-06 LAB — HM COLONOSCOPY

## 2020-05-08 ENCOUNTER — Encounter: Payer: Self-pay | Admitting: Internal Medicine

## 2020-05-19 ENCOUNTER — Encounter: Payer: BC Managed Care – PPO | Admitting: Internal Medicine

## 2020-06-05 ENCOUNTER — Other Ambulatory Visit: Payer: Self-pay

## 2020-06-05 ENCOUNTER — Encounter: Payer: Self-pay | Admitting: Internal Medicine

## 2020-06-05 ENCOUNTER — Ambulatory Visit (INDEPENDENT_AMBULATORY_CARE_PROVIDER_SITE_OTHER): Payer: BC Managed Care – PPO | Admitting: Internal Medicine

## 2020-06-05 VITALS — BP 124/80 | HR 78 | Temp 97.6°F | Ht 62.6 in | Wt 204.4 lb

## 2020-06-05 DIAGNOSIS — I10 Essential (primary) hypertension: Secondary | ICD-10-CM | POA: Diagnosis not present

## 2020-06-05 DIAGNOSIS — Z6836 Body mass index (BMI) 36.0-36.9, adult: Secondary | ICD-10-CM | POA: Diagnosis not present

## 2020-06-05 MED ORDER — VALSARTAN-HYDROCHLOROTHIAZIDE 80-12.5 MG PO TABS: 1.0000 | ORAL_TABLET | Freq: Every day | ORAL | 1 refills | Status: DC

## 2020-06-05 MED ORDER — VALSARTAN-HYDROCHLOROTHIAZIDE 80-12.5 MG PO TABS
1.0000 | ORAL_TABLET | Freq: Every day | ORAL | 1 refills | Status: DC
Start: 2020-06-05 — End: 2020-08-18

## 2020-06-05 NOTE — Progress Notes (Signed)
I,Katawbba Wiggins,acting as a Education administrator for Maximino Greenland, MD.,have documented all relevant documentation on the behalf of Maximino Greenland, MD,as directed by  Maximino Greenland, MD while in the presence of Maximino Greenland, MD.  This visit occurred during the SARS-CoV-2 public health emergency.  Safety protocols were in place, including screening questions prior to the visit, additional usage of staff PPE, and extensive cleaning of exam room while observing appropriate contact time as indicated for disinfecting solutions.  Subjective:     Patient ID: Jessica Rodgers , female    DOB: 10-26-1975 , 45 y.o.   MRN: [id number]   Chief Complaint  Patient presents with  . Obesity    HPI  The patient is here today for obesity/bp f/u.  She has been taking Qsymia without any issues. Just recently received 11.25mg  dose. Unfortunately, there has been a delay in shipping. There is an express shipping option, which she has tried in the past, but this did not result in a quicker delivery. She admits she is not yet exercising as she has in the past.  Hypertension This is a chronic problem. The current episode started more than 1 year ago. The problem has been gradually improving since onset. The problem is controlled. Pertinent negatives include no blurred vision, chest pain, palpitations or shortness of breath. Past treatments include ACE inhibitors, angiotensin blockers and diuretics. The current treatment provides moderate improvement.     Past Medical History:  Diagnosis Date  . GERD (gastroesophageal reflux disease)   . Hypertension      Family History  Problem Relation Age of Onset  . Hypertension Mother   . Arthritis Mother   . Glaucoma Mother   . Other Father        unsure of his health  . Breast cancer Maternal Grandmother 50     Current Outpatient Medications:  .  Cholecalciferol (VITAMIN D PO), Take 1 tablet by mouth daily. 5,000u, Disp: , Rfl:  .  Phentermine-Topiramate (QSYMIA)  11.25-69 MG CP24, Take by mouth., Disp: , Rfl:  .  valsartan-hydrochlorothiazide (DIOVAN-HCT) 80-12.5 MG tablet, Take 1 tablet by mouth daily., Disp: 90 tablet, Rfl: 1   No Known Allergies   Review of Systems  Constitutional: Negative.   Eyes: Negative for blurred vision.  Respiratory: Negative.  Negative for shortness of breath.   Cardiovascular: Negative.  Negative for chest pain and palpitations.  Gastrointestinal: Negative.   Psychiatric/Behavioral: Negative.   All other systems reviewed and are negative.    Today's Vitals   06/05/20 0908  BP: 124/80  Pulse: 78  Temp: 97.6 F (36.4 C)  TempSrc: Oral  Weight: 204 lb 6.4 oz (92.7 kg)  Height: 5' 2.6" (1.59 m)   Body mass index is 36.67 kg/m.  Wt Readings from Last 3 Encounters:  06/05/20 204 lb 6.4 oz (92.7 kg)  04/09/20 205 lb (93 kg)  02/06/20 197 lb 3.2 oz (89.4 kg)   Objective:  Physical Exam Vitals and nursing note reviewed.  Constitutional:      Appearance: Normal appearance. She is obese.  HENT:     Head: Normocephalic and atraumatic.     Nose:     Comments: Masked     Mouth/Throat:     Comments: Masked  Eyes:     Extraocular Movements: Extraocular movements intact.  Cardiovascular:     Rate and Rhythm: Normal rate and regular rhythm.     Heart sounds: Normal heart sounds.  Pulmonary:     Effort:  Pulmonary effort is normal.     Breath sounds: Normal breath sounds.  Musculoskeletal:     Cervical back: Normal range of motion.  Skin:    General: Skin is warm.  Neurological:     General: No focal deficit present.     Mental Status: She is alert.  Psychiatric:        Mood and Affect: Mood normal.        Behavior: Behavior normal.         Assessment And Plan:     1. Class 2 severe obesity due to excess calories with serious comorbidity and body mass index (BMI) of 36.0 to 36.9 in adult [facility]) Comments: She has lost one pound since her last visit. She will stay on 11.25mg  dose x 2 months before  going to higher dose if needed. F/u in 8 weeks. Again, she is encouraged to aim for at least 150 minutes of exercise per week.   2. Essential hypertension Comments: Well controlled. She will c/w valsartan/hct.   Patient was given opportunity to ask questions. Patient verbalized understanding of the plan and was able to repeat key elements of the plan. All questions were answered to their satisfaction.   I, Maximino Greenland, MD, have reviewed all documentation for this visit. The documentation on 06/07/20 for the exam, diagnosis, procedures, and orders are all accurate and complete.   IF YOU HAVE BEEN REFERRED TO A SPECIALIST, IT MAY TAKE 1-2 WEEKS TO SCHEDULE/PROCESS THE REFERRAL. IF YOU HAVE NOT HEARD FROM US/SPECIALIST IN TWO WEEKS, PLEASE GIVE [place] A CALL AT [phone].   THE PATIENT IS ENCOURAGED TO PRACTICE SOCIAL DISTANCING DUE TO THE COVID-19 PANDEMIC.

## 2020-06-05 NOTE — Patient Instructions (Signed)

## 2020-06-17 ENCOUNTER — Encounter: Payer: BC Managed Care – PPO | Admitting: Internal Medicine

## 2020-06-18 ENCOUNTER — Encounter: Payer: Self-pay | Admitting: Internal Medicine

## 2020-06-18 DIAGNOSIS — Z860100 Personal history of colon polyps, unspecified: Secondary | ICD-10-CM | POA: Insufficient documentation

## 2020-06-18 DIAGNOSIS — Z860109 Personal history of other colon polyps: Secondary | ICD-10-CM | POA: Insufficient documentation

## 2020-07-21 ENCOUNTER — Ambulatory Visit (INDEPENDENT_AMBULATORY_CARE_PROVIDER_SITE_OTHER): Payer: BC Managed Care – PPO | Admitting: Nurse Practitioner

## 2020-07-21 ENCOUNTER — Encounter: Payer: Self-pay | Admitting: Nurse Practitioner

## 2020-07-21 ENCOUNTER — Other Ambulatory Visit: Payer: Self-pay

## 2020-07-21 VITALS — BP 118/70 | HR 99 | Temp 99.2°F | Ht 62.6 in | Wt 197.8 lb

## 2020-07-21 DIAGNOSIS — R350 Frequency of micturition: Secondary | ICD-10-CM

## 2020-07-21 LAB — POCT URINALYSIS DIPSTICK
Bilirubin, UA: NEGATIVE
Glucose, UA: NEGATIVE
Ketones, UA: NEGATIVE
Leukocytes, UA: NEGATIVE
Nitrite, UA: NEGATIVE
Protein, UA: NEGATIVE
Spec Grav, UA: 1.02 (ref 1.010–1.025)
Urobilinogen, UA: 1 E.U./dL
pH, UA: 7.5 (ref 5.0–8.0)

## 2020-07-21 MED ORDER — NITROFURANTOIN MONOHYD MACRO 100 MG PO CAPS: 100.0000 mg | ORAL_CAPSULE | Freq: Two times a day (BID) | ORAL | 0 refills | Status: AC

## 2020-07-21 NOTE — Progress Notes (Signed)
I,Yamilka Roman [employer] as a Education administrator for [employer], FNP.,have documented all relevant documentation on the behalf of Minette Brine, FNP,as directed by  Minette Brine, FNP while in the presence of Minette Brine, [place]. This visit occurred during the SARS-CoV-2 public health emergency.  Safety protocols were in place, including screening questions prior to the visit, additional usage of staff PPE, and extensive cleaning of exam room while observing appropriate contact time as indicated for disinfecting solutions.  Subjective:     Patient ID: Jessica Rodgers , female    DOB: 15-Mar-1976 , 45 y.o.   MRN: [id number]   Chief Complaint  Patient presents with  . Urinary Frequency    Patient presents today for urinary frequency and some discomfort. She did start taking AZO.     HPI  She feels may be related to her soap, she has ran out and used an oatmeal based.  She reports her symptoms are better today after taking probiotic, drinking cranberry tabs and increased water intak since using this.   Urinary Frequency  This is a recurrent problem. The current episode started in the past 7 days (4-5 days ago). The problem has been gradually improving. Quality: mild amount of irritation. There has been no fever. Associated symptoms include frequency. Treatments tried: cranberry tablets and increased water intake. The treatment provided mild relief. There is no history of catheterization.     Past Medical History:  Diagnosis Date  . GERD (gastroesophageal reflux disease)   . Hypertension      Family History  Problem Relation Age of Onset  . Hypertension Mother   . Arthritis Mother   . Glaucoma Mother   . Other Father        unsure of his health  . Breast cancer Maternal Grandmother 50     Current Outpatient Medications:  .  Cholecalciferol (VITAMIN D PO), Take 1 tablet by mouth daily. 5,000u, Disp: , Rfl:  .  nitrofurantoin, macrocrystal-monohydrate, (MACROBID) 100 MG capsule, Take 1  capsule (100 mg total) by mouth 2 (two) times daily for 5 days., Disp: 10 capsule, Rfl: 0 .  Phentermine-Topiramate (QSYMIA) 11.25-69 MG CP24, Take by mouth., Disp: , Rfl:  .  valsartan-hydrochlorothiazide (DIOVAN-HCT) 80-12.5 MG tablet, Take 1 tablet by mouth daily., Disp: 90 tablet, Rfl: 1   No Known Allergies   Review of Systems  Constitutional: Negative.   Respiratory: Negative.   Cardiovascular: Negative.  Negative for chest pain, palpitations and leg swelling.  Genitourinary: Positive for frequency. Negative for vaginal pain.  Neurological: Negative for dizziness and headaches.  Psychiatric/Behavioral: Negative.      Today's Vitals   07/21/20 1628  BP: 118/70  Pulse: 99  Temp: 99.2 F (37.3 C)  TempSrc: Oral  Weight: 197 lb 12.8 oz (89.7 kg)  Height: 5' 2.6" (1.59 m)  PainSc: 0-No pain   Body mass index is 35.49 kg/m.   Objective:  Physical Exam Constitutional:      General: She is not in acute distress.    Appearance: Normal appearance. She is obese.  Cardiovascular:     Rate and Rhythm: Normal rate and regular rhythm.     Pulses: Normal pulses.     Heart sounds: Normal heart sounds. No murmur heard.   Pulmonary:     Effort: Pulmonary effort is normal. No respiratory distress.     Breath sounds: Normal breath sounds. No wheezing.  Abdominal:     General: Abdomen is flat. Bowel sounds are normal. There is no distension.  Palpations: Abdomen is soft.     Tenderness: There is no abdominal tenderness.  Neurological:     General: No focal deficit present.     Mental Status: She is alert and oriented to person, place, and time.     Cranial Nerves: No cranial nerve deficit.  Psychiatric:        Mood and Affect: Mood normal.        Behavior: Behavior normal.        Thought Content: Thought content normal.        Judgment: Judgment normal.         Assessment And Plan:     1. Urinary frequency  She does have moderate blood in urine but her menstrual  cycle is on due to the history of urinary tract infections will treat with nitrofuratoin and send urine for culture.   Continue with increased fluids - POCT Urinalysis Dipstick (41937) - Urine Culture     Patient was given opportunity to ask questions. Patient verbalized understanding of the plan and was able to repeat key elements of the plan. All questions were answered to their satisfaction.  Minette Brine, FNP   I, Minette Brine, FNP, have reviewed all documentation for this visit. The documentation on 07/21/20 for the exam, diagnosis, procedures, and orders are all accurate and complete.   IF YOU HAVE BEEN REFERRED TO A SPECIALIST, IT MAY TAKE 1-2 WEEKS TO SCHEDULE/PROCESS THE REFERRAL. IF YOU HAVE NOT HEARD FROM US/SPECIALIST IN TWO WEEKS, PLEASE GIVE [place] A CALL AT [phone] X [phone].   THE PATIENT IS ENCOURAGED TO PRACTICE SOCIAL DISTANCING DUE TO THE COVID-19 PANDEMIC.

## 2020-07-23 LAB — URINE CULTURE

## 2020-08-07 ENCOUNTER — Encounter: Payer: Self-pay | Admitting: Internal Medicine

## 2020-08-07 ENCOUNTER — Other Ambulatory Visit: Payer: Self-pay

## 2020-08-07 ENCOUNTER — Ambulatory Visit (INDEPENDENT_AMBULATORY_CARE_PROVIDER_SITE_OTHER): Payer: BC Managed Care – PPO | Admitting: Internal Medicine

## 2020-08-07 VITALS — BP 128/62 | HR 61 | Temp 98.1°F | Ht 62.6 in | Wt 198.0 lb

## 2020-08-07 DIAGNOSIS — Z716 Tobacco abuse counseling: Secondary | ICD-10-CM

## 2020-08-07 DIAGNOSIS — I10 Essential (primary) hypertension: Secondary | ICD-10-CM | POA: Diagnosis not present

## 2020-08-07 DIAGNOSIS — R0602 Shortness of breath: Secondary | ICD-10-CM

## 2020-08-07 DIAGNOSIS — Z6835 Body mass index (BMI) 35.0-35.9, adult: Secondary | ICD-10-CM

## 2020-08-07 LAB — CMP14+EGFR
ALT: 17 IU/L (ref 0–32)
AST: 27 IU/L (ref 0–40)
Albumin/Globulin Ratio: 1.5 (ref 1.2–2.2)
Albumin: 4.2 g/dL (ref 3.8–4.8)
Alkaline Phosphatase: 55 IU/L (ref 44–121)
BUN/Creatinine Ratio: 19 (ref 9–23)
BUN: 19 mg/dL (ref 6–24)
Bilirubin Total: 0.3 mg/dL (ref 0.0–1.2)
CO2: 19 mmol/L — ABNORMAL LOW (ref 20–29)
Calcium: 9.1 mg/dL (ref 8.7–10.2)
Chloride: 109 mmol/L — ABNORMAL HIGH (ref 96–106)
Creatinine, Ser: 1.02 mg/dL — ABNORMAL HIGH (ref 0.57–1.00)
Globulin, Total: 2.8 g/dL (ref 1.5–4.5)
Glucose: 97 mg/dL (ref 65–99)
Potassium: 4.8 mmol/L (ref 3.5–5.2)
Sodium: 142 mmol/L (ref 134–144)
Total Protein: 7 g/dL (ref 6.0–8.5)
eGFR: 68 mL/min/{1.73_m2} (ref >59)

## 2020-08-07 LAB — LIPID PANEL
Chol/HDL Ratio: 2.4 ratio (ref 0.0–4.4)
Cholesterol, Total: 193 mg/dL (ref 100–199)
HDL: 79 mg/dL (ref >39)
LDL Chol Calc (NIH): 102 mg/dL — ABNORMAL HIGH (ref 0–99)
Triglycerides: 67 mg/dL (ref 0–149)
VLDL Cholesterol Cal: 12 mg/dL (ref 5–40)

## 2020-08-07 LAB — CBC
Hematocrit: 38.8 % (ref 34.0–46.6)
Hemoglobin: 13.4 g/dL (ref 11.1–15.9)
MCH: 35.4 pg — ABNORMAL HIGH (ref 26.6–33.0)
MCHC: 34.5 g/dL (ref 31.5–35.7)
MCV: 102 fL — ABNORMAL HIGH (ref 79–97)
Platelets: 211 10*3/uL (ref 150–450)
RBC: 3.79 x10E6/uL (ref 3.77–5.28)
RDW: 11.2 % — ABNORMAL LOW (ref 11.7–15.4)
WBC: 7.3 10*3/uL (ref 3.4–10.8)

## 2020-08-07 NOTE — Patient Instructions (Signed)

## 2020-08-07 NOTE — Progress Notes (Signed)
I,Yamilka Roman [employer] as a Education administrator for Maximino Greenland, MD.,have documented all relevant documentation on the behalf of Maximino Greenland, MD,as directed by  Maximino Greenland, MD while in the presence of Maximino Greenland, MD. This visit occurred during the SARS-CoV-2 public health emergency.  Safety protocols were in place, including screening questions prior to the visit, additional usage of staff PPE, and extensive cleaning of exam room while observing appropriate contact time as indicated for disinfecting solutions.  Subjective:     Patient ID: Jessica Rodgers , female    DOB: 1975/09/14 , 45 y.o.   MRN: [id number]   Chief Complaint  Patient presents with  . Hypertension  . Weight Check    HPI  The patient is here today for HTN/weight check. She reports compliance with meds.   Hypertension This is a chronic problem. The current episode started more than 1 year ago. The problem has been gradually improving since onset. The problem is controlled. Associated symptoms include shortness of breath. Pertinent negatives include no blurred vision, chest pain or palpitations. Past treatments include ACE inhibitors, angiotensin blockers and diuretics. The current treatment provides moderate improvement.     Past Medical History:  Diagnosis Date  . GERD (gastroesophageal reflux disease)   . Hypertension      Family History  Problem Relation Age of Onset  . Hypertension Mother   . Arthritis Mother   . Glaucoma Mother   . Other Father        unsure of his health  . Breast cancer Maternal Grandmother 50     Current Outpatient Medications:  .  Cholecalciferol (VITAMIN D PO), Take 1 tablet by mouth daily. 5,000u, Disp: , Rfl:  .  Phentermine-Topiramate (QSYMIA) 11.25-69 MG CP24, Take by mouth., Disp: , Rfl:  .  valsartan-hydrochlorothiazide (DIOVAN-HCT) 80-12.5 MG tablet, Take 1 tablet by mouth daily., Disp: 90 tablet, Rfl: 1   No Known Allergies   Review of Systems   Constitutional: Negative.   Eyes: Negative for blurred vision.  Respiratory: Positive for shortness of breath.        She states she had SOB on Sunday. She was walking in a park going fishing.   Cardiovascular: Negative.  Negative for chest pain and palpitations.  Gastrointestinal: Negative.   Neurological: Negative.   Psychiatric/Behavioral: Negative.      Today's Vitals   08/07/20 0843  BP: 128/62  Pulse: 61  Temp: 98.1 F (36.7 C)  Weight: 198 lb (89.8 kg)  Height: 5' 2.6" (1.59 m)  PainSc: 0-No pain   Body mass index is 35.52 kg/m.  Wt Readings from Last 3 Encounters:  08/07/20 198 lb (89.8 kg)  07/21/20 197 lb 12.8 oz (89.7 kg)  06/05/20 204 lb 6.4 oz (92.7 kg)    Objective:  Physical Exam Vitals and nursing note reviewed.  Constitutional:      Appearance: Normal appearance.  HENT:     Head: Normocephalic and atraumatic.     Nose:     Comments: Masked     Mouth/Throat:     Comments: Masked  Cardiovascular:     Rate and Rhythm: Normal rate and regular rhythm.     Heart sounds: Normal heart sounds.  Pulmonary:     Effort: Pulmonary effort is normal.     Breath sounds: Normal breath sounds.  Musculoskeletal:     Cervical back: Normal range of motion.  Skin:    General: Skin is warm.  Neurological:     General:  No focal deficit present.     Mental Status: She is alert.  Psychiatric:        Mood and Affect: Mood normal.        Behavior: Behavior normal.         Assessment And Plan:     1. Essential hypertension Comments: Chronic, well controlled. She will continue with valsartan/hct 80/12.5mg  daily.  I will check renal function today. She will rto in July 2022 for CPE.  - CMP14+EGFR - Lipid panel - Ambulatory referral to Cardiology  2. Shortness of breath Comments: She has h/o HTN, will schedule echo. Would also like to refer her to Cardiology for cardiac risk assessment. She is aware that there could also be a pulmonary component to this due to  tobacco use. Will consider Pulm eval/PFTs if cardiac workup is negative. She does report occasional menorrhagia. Will check CBC as well.  - CBC no Diff - ECHOCARDIOGRAM COMPLETE; Future - Ambulatory referral to Cardiology  3. Class 2 severe obesity due to excess calories with serious comorbidity and body mass index (BMI) of 35.0 to 35.9 in adult [facility]) Comments: I will refill Qsymia 11.25mg  daily. Encouraged to incorporate more exercise into her daily routine. This has proven to be effective for her in the past. She will f/u in 8 weeks.   4. Tobacco abuse counseling  Smoking cessation instruction/counseling given:  counseled patient on the dangers of tobacco use, advised patient to stop smoking, and reviewed strategies to maximize success  Patient was given opportunity to ask questions. Patient verbalized understanding of the plan and was able to repeat key elements of the plan. All questions were answered to their satisfaction.   I, Maximino Greenland, MD, have reviewed all documentation for this visit. The documentation on 08/07/20 for the exam, diagnosis, procedures, and orders are all accurate and complete.   IF YOU HAVE BEEN REFERRED TO A SPECIALIST, IT MAY TAKE 1-2 WEEKS TO SCHEDULE/PROCESS THE REFERRAL. IF YOU HAVE NOT HEARD FROM US/SPECIALIST IN TWO WEEKS, PLEASE GIVE [place] A CALL AT [phone].   THE PATIENT IS ENCOURAGED TO PRACTICE SOCIAL DISTANCING DUE TO THE COVID-19 PANDEMIC.

## 2020-08-13 LAB — SPECIMEN STATUS REPORT

## 2020-08-13 LAB — B12 AND FOLATE PANEL
Folate: 7.1 ng/mL (ref >3.0)
Vitamin B-12: 611 pg/mL (ref 232–1245)

## 2020-08-14 ENCOUNTER — Encounter: Payer: Self-pay | Admitting: Internal Medicine

## 2020-08-15 ENCOUNTER — Other Ambulatory Visit: Payer: Self-pay

## 2020-08-15 ENCOUNTER — Emergency Department (HOSPITAL_COMMUNITY): Payer: BC Managed Care – PPO

## 2020-08-15 ENCOUNTER — Inpatient Hospital Stay (HOSPITAL_COMMUNITY)
Admission: EM | Admit: 2020-08-15 | Discharge: 2020-08-18 | DRG: 176 | Disposition: A | Discharge status: Home / Self Care | Payer: BC Managed Care – PPO | Source: Ambulatory Visit | Attending: Internal Medicine | Admitting: Internal Medicine

## 2020-08-15 ENCOUNTER — Encounter (HOSPITAL_COMMUNITY): Payer: Self-pay

## 2020-08-15 ENCOUNTER — Ambulatory Visit
Admission: EM | Admit: 2020-08-15 | Discharge: 2020-08-15 | Disposition: A | Discharge status: Home / Self Care | Payer: BC Managed Care – PPO | Attending: Student | Admitting: Student

## 2020-08-15 DIAGNOSIS — I272 Pulmonary hypertension, unspecified: Secondary | ICD-10-CM | POA: Diagnosis present

## 2020-08-15 DIAGNOSIS — Z20822 Contact with and (suspected) exposure to covid-19: Secondary | ICD-10-CM | POA: Diagnosis present

## 2020-08-15 DIAGNOSIS — I2609 Other pulmonary embolism with acute cor pulmonale: Secondary | ICD-10-CM

## 2020-08-15 DIAGNOSIS — F17200 Nicotine dependence, unspecified, uncomplicated: Secondary | ICD-10-CM

## 2020-08-15 DIAGNOSIS — R9431 Abnormal electrocardiogram [ECG] [EKG]: Secondary | ICD-10-CM

## 2020-08-15 DIAGNOSIS — Z6835 Body mass index (BMI) 35.0-35.9, adult: Secondary | ICD-10-CM

## 2020-08-15 DIAGNOSIS — Z8261 Family history of arthritis: Secondary | ICD-10-CM

## 2020-08-15 DIAGNOSIS — Z87891 Personal history of nicotine dependence: Secondary | ICD-10-CM

## 2020-08-15 DIAGNOSIS — R42 Dizziness and giddiness: Secondary | ICD-10-CM | POA: Diagnosis not present

## 2020-08-15 DIAGNOSIS — I10 Essential (primary) hypertension: Secondary | ICD-10-CM

## 2020-08-15 DIAGNOSIS — I2694 Multiple subsegmental pulmonary emboli without acute cor pulmonale: Secondary | ICD-10-CM | POA: Diagnosis not present

## 2020-08-15 DIAGNOSIS — Z79899 Other long term (current) drug therapy: Secondary | ICD-10-CM

## 2020-08-15 DIAGNOSIS — E669 Obesity, unspecified: Secondary | ICD-10-CM

## 2020-08-15 DIAGNOSIS — Z83511 Family history of glaucoma: Secondary | ICD-10-CM

## 2020-08-15 DIAGNOSIS — I82432 Acute embolism and thrombosis of left popliteal vein: Secondary | ICD-10-CM | POA: Diagnosis present

## 2020-08-15 DIAGNOSIS — N179 Acute kidney failure, unspecified: Secondary | ICD-10-CM | POA: Diagnosis not present

## 2020-08-15 DIAGNOSIS — I82409 Acute embolism and thrombosis of unspecified deep veins of unspecified lower extremity: Secondary | ICD-10-CM

## 2020-08-15 DIAGNOSIS — R0602 Shortness of breath: Secondary | ICD-10-CM | POA: Diagnosis not present

## 2020-08-15 DIAGNOSIS — I2699 Other pulmonary embolism without acute cor pulmonale: Secondary | ICD-10-CM | POA: Diagnosis not present

## 2020-08-15 DIAGNOSIS — Z8249 Family history of ischemic heart disease and other diseases of the circulatory system: Secondary | ICD-10-CM

## 2020-08-15 LAB — CBC
HCT: 42.8 % (ref 36.0–46.0)
Hemoglobin: 14.5 g/dL (ref 12.0–15.0)
MCH: 35.2 pg — ABNORMAL HIGH (ref 26.0–34.0)
MCHC: 33.9 g/dL (ref 30.0–36.0)
MCV: 103.9 fL — ABNORMAL HIGH (ref 80.0–100.0)
Platelets: 230 10*3/uL (ref 150–400)
RBC: 4.12 MIL/uL (ref 3.87–5.11)
RDW: 11.8 % (ref 11.5–15.5)
WBC: 9.2 10*3/uL (ref 4.0–10.5)
nRBC: 0 % (ref 0.0–0.2)

## 2020-08-15 LAB — APTT: aPTT: 29 seconds (ref 24–36)

## 2020-08-15 LAB — BASIC METABOLIC PANEL
Anion gap: 7 (ref 5–15)
BUN: 19 mg/dL (ref 6–20)
CO2: 23 mmol/L (ref 22–32)
Calcium: 9.1 mg/dL (ref 8.9–10.3)
Chloride: 108 mmol/L (ref 98–111)
Creatinine, Ser: 1.29 mg/dL — ABNORMAL HIGH (ref 0.44–1.00)
GFR, Estimated: 52 mL/min — ABNORMAL LOW (ref >60)
Glucose, Bld: 109 mg/dL — ABNORMAL HIGH (ref 70–99)
Potassium: 3.8 mmol/L (ref 3.5–5.1)
Sodium: 138 mmol/L (ref 135–145)

## 2020-08-15 LAB — BRAIN NATRIURETIC PEPTIDE: B Natriuretic Peptide: 523.5 pg/mL — ABNORMAL HIGH (ref 0.0–100.0)

## 2020-08-15 LAB — PROTIME-INR
INR: 0.9 (ref 0.8–1.2)
Prothrombin Time: 12.5 seconds (ref 11.4–15.2)

## 2020-08-15 LAB — TROPONIN I (HIGH SENSITIVITY)
Troponin I (High Sensitivity): 23 ng/L — ABNORMAL HIGH (ref <18)
Troponin I (High Sensitivity): 26 ng/L — ABNORMAL HIGH (ref <18)

## 2020-08-15 LAB — I-STAT BETA HCG BLOOD, ED (MC, WL, AP ONLY): I-stat hCG, quantitative: <5 m[IU]/mL (ref <5)

## 2020-08-15 LAB — D-DIMER, QUANTITATIVE: D-Dimer, Quant: 3.07 ug/mL-FEU — ABNORMAL HIGH (ref 0.00–0.50)

## 2020-08-15 LAB — SARS CORONAVIRUS 2 (TAT 6-24 HRS): SARS Coronavirus 2: NEGATIVE

## 2020-08-15 MED ORDER — SODIUM CHLORIDE (PF) 0.9 % IJ SOLN
INTRAMUSCULAR | Status: AC
  Filled 2020-08-15: qty 50

## 2020-08-15 MED ORDER — HEPARIN BOLUS VIA INFUSION
4500.0000 [IU] | Freq: Once | INTRAVENOUS | Status: AC
  Administered 2020-08-15: 4500 [IU] via INTRAVENOUS
  Filled 2020-08-15: qty 4500

## 2020-08-15 MED ORDER — SODIUM CHLORIDE 0.9 % IV SOLN: INTRAVENOUS | Status: DC

## 2020-08-15 MED ORDER — IOHEXOL 350 MG/ML SOLN
100.0000 mL | Freq: Once | INTRAVENOUS | Status: AC | PRN
  Administered 2020-08-15: 100 mL via INTRAVENOUS

## 2020-08-15 MED ORDER — HEPARIN (PORCINE) 25000 UT/250ML-% IV SOLN
1150.0000 [IU]/h | INTRAVENOUS | Status: DC
  Administered 2020-08-15: 1250 [IU]/h via INTRAVENOUS
  Administered 2020-08-16 – 2020-08-17 (×2): 1150 [IU]/h via INTRAVENOUS
  Filled 2020-08-15 (×4): qty 250

## 2020-08-15 NOTE — ED Triage Notes (Signed)
Patient presents to Urgent Care with complaints of SOB, abdominal discomfort, dizziness x 1 week. Pt states she saw her PCP this week, lab work completed with some abnormal results and referrals in place for pulmonary and cardiology in 2 weeks. She states she get exerted with any activity and symptoms have worsened. She reports being a smoker who quit with the start of her SOB. She also has a hx of GERD but states this does no feel like her indigestion episodes in the past.   Denies fever, n/v, or diarrhea.

## 2020-08-15 NOTE — Progress Notes (Signed)
ANTICOAGULATION CONSULT NOTE - Initial Consult  Pharmacy Consult for IV heparin  Indication: pulmonary embolus  No Known Allergies  Patient Measurements: Height: 5' 2.5" (158.8 cm) Weight: 89.8 kg (198 lb) IBW/kg (Calculated) : 51.25 Heparin Dosing Weight: 71.8 kg  Vital Signs: Temp: 97.8 F (36.6 C) (06/03 1050) Temp Source: Oral (06/03 1050) BP: 124/101 (06/03 1753) Pulse Rate: 95 (06/03 1753)  Labs: Recent Labs    08/15/20 1056 08/15/20 1256  HGB 14.5  --   HCT 42.8  --   PLT 230  --   CREATININE 1.29*  --   TROPONINIHS 23* 26*    Estimated Creatinine Clearance: 56.8 mL/min (A) (by C-G formula based on SCr of 1.29 mg/dL (H)).   Medical History: Past Medical History:  Diagnosis Date  . GERD (gastroesophageal reflux disease)   . Hypertension     Assessment: 45 year old female with PMH of GERD, HTN, tobacco abuse presenting to [facility] ED on 08/15/2020 with 1 week of SOB, dizziness. D-dimer elevated. CTa chest positive for bilateral PE with right heart strain. Pharmacy consulted for IV heparin dosing. Patient not on anticoagulants PTA. H/H, Pltc WNL.   Goal of Therapy:  Heparin level 0.3-0.7 units/ml Monitor platelets by anticoagulation protocol: Yes   Plan:   Baseline aPTT, PT/INR  Heparin 4500 units IV bolus x 1, then start heparin infusion at 1250 units/hr  Heparin level 6 hours after initiation  Daily CBC, heparin level  Monitor closely for s/sx of bleeding   Lindell Spar, PharmD, BCPS Clinical Pharmacist  08/15/2020,6:09 PM

## 2020-08-15 NOTE — ED Provider Notes (Signed)
Medical Decision Making: Care of patient assumed from Dr. Almyra Free at 1500.  Agree with history, physical exam and plan.  See their note for further details.  Briefly, The pt p/w 1 week of shortness of breath getting better no chest pain or tightness no fever exposure to COVID.  Some generalized malaise lightheadedness no LOC..   Current plan is as follows: First troponin mildly elevated needs a second.  D-dimer pending.  Is positive.  Needs CT PE study.  D PE study with bilateral large lobar PEs.  Lab studies show concerns for heart strain as well as CT findings.  Patient will need heparin and admission.  CRITICAL CARE Performed by: Breck Coons   Total critical care time: 45 minutes  Critical care time was exclusive of separately billable procedures and treating other patients.  Critical care was necessary to treat or prevent imminent or life-threatening deterioration.  Critical care was time spent personally by me on the following activities: development of treatment plan with patient and/or surrogate as well as nursing, discussions with consultants, evaluation of patient's response to treatment, examination of patient, obtaining history from patient or surrogate, ordering and performing treatments and interventions, ordering and review of laboratory studies, ordering and review of radiographic studies, pulse oximetry and re-evaluation of patient's condition.    I personally reviewed and interpreted all labs/imaging.  .Critical Care E&M Performed by: Breck Coons, MD  Critical care provider statement:    Critical care time (minutes):  45   Critical care was necessary to treat or prevent imminent or life-threatening deterioration of the following conditions:  Circulatory failure and cardiac failure   Critical care was time spent personally by me on the following activities:  Blood draw for specimens, development of treatment plan with patient or surrogate, discussions with consultants,  evaluation of patient's response to treatment, examination of patient, obtaining history from patient or surrogate, ordering and performing treatments and interventions, ordering and review of laboratory studies, re-evaluation of patient's condition, review of old charts, ordering and review of radiographic studies and pulse oximetry   Care discussed with: admitting provider   After initial E/M assessment, critical care services were subsequently performed that were exclusive of separately billable procedures or treatment.         Breck Coons, MD 08/15/20 [phone]

## 2020-08-15 NOTE — Discharge Instructions (Addendum)
-  Head straight to the emergency department for further evaluation and management of dizziness and shortness of breath.  You have changes on EKG that indicate your heart is struggling for oxygen.  These changes can indicate a pulmonary issue, but they can also indicate changes that happen early in a heart attack.  I cannot rule out a cardiac event at this urgent care, so you must head to the emergency department for this evaluation.  If you experience worsening of symptoms on the way, like dizziness, shortness of breath, chest pain-stop and call 911

## 2020-08-15 NOTE — ED Notes (Signed)
Patient is being discharged from the Urgent Care and sent to the Emergency Department via POV. Per Phillip Heal, [place], patient is in need of higher level of care due to SOB, dizziness, and EKG changes. Patient is aware and verbalizes understanding of plan of care.  Vitals:   08/15/20 0937 08/15/20 0939  BP: 99/73 108/78  Pulse: 93   Resp: 20   Temp: 98.4 F (36.9 C)   SpO2: 96%

## 2020-08-15 NOTE — H&P (Signed)
History and Physical    Jessica Rodgers TDD:[id number] DOB: May 11, 1975 DOA: 08/15/2020  PCP: Glendale Chard, MD  Patient coming from: Home, daughter at bedside  I have personally briefly reviewed patient's old medical records in [place]  Chief Complaint: Increasing shortness of breath  HPI: Jessica Rodgers is a 45 y.o. female with medical history significant for hypertension, GERD and obesity who presents with concerns of increasing shortness of breath.  For the past week she has noticed shortness of breath that progressively got worse.  It is worse with ambulation and now to the point at rest.  She denies any chest pain or tightness.  Denies any lower extremity edema or pain.  No cough.  No fever.  She denies any recent travels but works from home and sits for long hours.  She also has used tobacco for at least 20 years half a pack per day.  She stopped for the past week due to increasing shortness of breath.  She is not on any oral contraceptives or hormone therapy.  No personal history of DVT or PE.  No family history that she is aware of however she does not know her father side of the family.  ED Course: She was mildly tachycardic tachypneic and normotensive on room air.  D-dimer greater than 3.  Troponin of 23 and 26.  CBC unremarkable.  Creatinine elevated to 1.29 from a prior of 0.95.  CTA chest shows extensive bilateral pulmonary embolism with associated right heart strain.  Patient was started on IV heparin and hospitalist was called for admission. Review of Systems:  Constitutional: No Weight Change, No Fever ENT/Mouth: No sore throat, No Rhinorrhea Eyes: No Eye Pain, No Vision Changes Cardiovascular: No Chest Pain, +SOB, No PND, + Dyspnea on Exertion, No Orthopnea, No Claudication, No Edema, No Palpitations Respiratory: No Cough, No Sputum, No Wheezing, no Dyspnea  Gastrointestinal: No Nausea, No Vomiting, No Diarrhea, Genitourinary: no Urinary Incontinence, No   Musculoskeletal: No Arthralgias, No Myalgias Skin: No Skin Lesions, No Pruritus, Neuro: no Weakness, No Numbness Psych: No Anxiety/Panic, No Depression, no decrease appetite Heme/Lymph: No Bruising, No Bleeding  Past Medical History:  Diagnosis Date  . GERD (gastroesophageal reflux disease)   . Hypertension     Past Surgical History:  Procedure Laterality Date  . BREAST EXCISIONAL BIOPSY Right 04/07/2017   PASH  . RADIOACTIVE SEED GUIDED EXCISIONAL BREAST BIOPSY Right 04/07/2017   Procedure: RIGHT RADIOACTIVE SEED GUIDED EXCISIONAL BREAST BIOPSY;  Surgeon: Rolm Bookbinder, MD;  Location: [facility];  Service: General;  Laterality: Right;  . TUBAL LIGATION  1997     reports that she quit smoking 6 days ago. Her smoking use included cigarettes. She started smoking about 17 years ago. She has a 9.00 pack-year smoking history. She has never used smokeless tobacco. She reports current alcohol use. She reports that she does not use drugs. Social History  No Known Allergies  Family History  Problem Relation Age of Onset  . Hypertension Mother   . Arthritis Mother   . Glaucoma Mother   . Other Father        unsure of his health  . Breast cancer Maternal Grandmother 50     Prior to Admission medications   Medication Sig Start Date End Date Taking? Authorizing Provider  Ascorbic Acid (VITAMIN C) 1000 MG tablet Take 1,000 mg by mouth daily.   Yes [provider]  Cholecalciferol (VITAMIN D PO) Take 1 tablet by mouth daily.  5,000u   Yes [provider]  Cyanocobalamin (VITAMIN B 12 PO) Take 1 tablet by mouth daily.   Yes [provider]  ELDERBERRY PO Take 1 tablet by mouth daily.   Yes [provider]  Phentermine-Topiramate (QSYMIA) 11.25-69 MG CP24 Take 1 tablet by mouth daily.   Yes [provider]  valsartan-hydrochlorothiazide (DIOVAN-HCT) 80-12.5 MG tablet Take 1 tablet by mouth daily. 06/05/20  Yes Glendale Chard,  MD    Physical Exam: Vitals:   08/15/20 1530 08/15/20 1600 08/15/20 1630 08/15/20 1753  BP: (!) 128/103 (!) 127/112 (!) 121/92 (!) 124/101  Pulse:  88 80 95  Resp: 16 15 (!) 21 (!) 22  Temp:      TempSrc:      SpO2:  95% 98% 100%  Weight:      Height:        Constitutional: NAD, calm, comfortable, middle age female appearing younger than stated age sitting upright in bed Vitals:   08/15/20 1530 08/15/20 1600 08/15/20 1630 08/15/20 1753  BP: (!) 128/103 (!) 127/112 (!) 121/92 (!) 124/101  Pulse:  88 80 95  Resp: 16 15 (!) 21 (!) 22  Temp:      TempSrc:      SpO2:  95% 98% 100%  Weight:      Height:       Eyes: PERRL, lids and conjunctivae normal ENMT: Mucous membranes are moist.  Neck: normal, supple,  Respiratory: clear to auscultation bilaterally, no wheezing, no crackles.  Mild shortness of breath when speaking. No accessory muscle use.  Cardiovascular: Regular rate and rhythm, no murmurs / rubs / gallops. No extremity edema. 2+ pedal pulses. .  Abdomen: no tenderness, no masses palpated.  Bowel sounds positive.  Musculoskeletal: no clubbing / cyanosis. No joint deformity upper and lower extremities. Good ROM, no contractures. Normal muscle tone.  Skin: no rashes, lesions, ulcers. No induration Neurologic: CN 2-12 grossly intact. Sensation intact,  Strength 5/5 in all 4.  Psychiatric: Normal judgment and insight. Alert and oriented x 3. Normal mood.     Labs on Admission: I have personally reviewed following labs and imaging studies  CBC: Recent Labs  Lab 08/15/20 1056  WBC 9.2  HGB 14.5  HCT 42.8  MCV 103.9*  PLT 657   Basic Metabolic Panel: Recent Labs  Lab 08/15/20 1056  NA 138  K 3.8  CL 108  CO2 23  GLUCOSE 109*  BUN 19  CREATININE 1.29*  CALCIUM 9.1   GFR: Estimated Creatinine Clearance: 56.8 mL/min (A) (by C-G formula based on SCr of 1.29 mg/dL (H)). Liver Function Tests: No results for input(s): AST, ALT, ALKPHOS, BILITOT, PROT, ALBUMIN  in the last 168 hours. No results for input(s): LIPASE, AMYLASE in the last 168 hours. No results for input(s): AMMONIA in the last 168 hours. Coagulation Profile: Recent Labs  Lab 08/15/20 1808  INR 0.9   Cardiac Enzymes: No results for input(s): CKTOTAL, CKMB, CKMBINDEX, TROPONINI in the last 168 hours. BNP (last 3 results) No results for input(s): PROBNP in the last 8760 hours. HbA1C: No results for input(s): HGBA1C in the last 72 hours. CBG: No results for input(s): GLUCAP in the last 168 hours. Lipid Profile: No results for input(s): CHOL, HDL, LDLCALC, TRIG, CHOLHDL, LDLDIRECT in the last 72 hours. Thyroid Function Tests: No results for input(s): TSH, T4TOTAL, FREET4, T3FREE, THYROIDAB in the last 72 hours. Anemia Panel: No results for input(s): VITAMINB12, FOLATE, FERRITIN, TIBC, IRON, RETICCTPCT in the last 72  hours. Urine analysis:    Component Value Date/Time   COLORURINE YELLOW 11/05/2016 0101   APPEARANCEUR HAZY (A) 11/05/2016 0101   LABSPEC 1.013 11/05/2016 0101   PHURINE 5.0 11/05/2016 0101   GLUCOSEU NEGATIVE 11/05/2016 0101   HGBUR SMALL (A) 11/05/2016 0101   BILIRUBINUR negative 07/21/2020 1645   KETONESUR 5 (A) 11/05/2016 0101   PROTEINUR Negative 07/21/2020 1645   PROTEINUR 30 (A) 11/05/2016 0101   UROBILINOGEN 1.0 07/21/2020 1645   NITRITE negative 07/21/2020 1645   NITRITE NEGATIVE 11/05/2016 0101   LEUKOCYTESUR Negative 07/21/2020 1645    Radiological Exams on Admission: CT Angio Chest PE W and/or Wo Contrast  Result Date: 08/15/2020 CLINICAL DATA:  Shortness of breath with dizziness and abdominal discomfort x1 week. EXAM: CT ANGIOGRAPHY CHEST WITH CONTRAST TECHNIQUE: Multidetector CT imaging of the chest was performed using the standard protocol during bolus administration of intravenous contrast. Multiplanar CT image reconstructions and MIPs were obtained to evaluate the vascular anatomy. CONTRAST:  134mL OMNIPAQUE IOHEXOL 350 MG/ML SOLN COMPARISON:   None. FINDINGS: Cardiovascular: An extensive amount of intraluminal low attenuation is seen involving all branches of the bilateral pulmonary arteries. There is no evidence of saddle embolus. Normal heart size with mild right-sided heart strain. No pericardial effusion. Mediastinum/Nodes: No enlarged mediastinal, hilar, or axillary lymph nodes. Thyroid gland, trachea, and esophagus demonstrate no significant findings. Lungs/Pleura: Lungs are clear. No pleural effusion or pneumothorax. Upper Abdomen: Tiny gallstones are seen within the gallbladder lumen. Musculoskeletal: No chest wall abnormality. No acute or significant osseous findings. Review of the MIP images confirms the above findings. IMPRESSION: 1. Extensive bilateral pulmonary embolism with associated right heart strain. 2. Cholelithiasis. Electronically Signed   By: Virgina Norfolk M.D.   On: 08/15/2020 17:49   DG Chest Port 1 View  Result Date: 08/15/2020 CLINICAL DATA:  Shortness of breath. 45 year old female with 1 week of dizziness and shortness of breath. EXAM: PORTABLE CHEST 1 VIEW COMPARISON:  November 09, 2016 FINDINGS: Trachea midline. Cardiomediastinal contours and hilar structures are normal. Lungs are clear. Elevated RIGHT hemidiaphragm is similar to the prior study. On limited assessment no acute skeletal process. IMPRESSION: No acute cardiopulmonary disease. Electronically Signed   By: Zetta Bills M.D.   On: 08/15/2020 12:43      Assessment/Plan  Acute pulmonary embolism with right heart strain -Likely provoked-patient uses tobacco, has sedentary job, and is on phentermine-topirmate.  She does not know her father side of the family.  We will obtain hypercoagulable panel -Continue IV heparin infusion -Check bilateral lower extremity venous Doppler ultrasound -obtain echo  AKI Creatinine of 1.29 on admission  -Start continuous IV fluids and repeat labs  Hypertension -Hold home ACE-HCTZ combination for now due to  AKI  Obesity -hold phentermine-topirmate in light of PE-need to consider alternative weight loss method  Level of care: Telemetry  Status is: Observation  The patient remains OBS appropriate and will d/c before 2 midnights.  Dispo: The patient is from: Home              Anticipated d/c is to: Home              Patient currently is not medically stable to d/c.   Difficult to place patient No         Orene Desanctis DO [facility]   If 7PM-7AM, please contact night-coverage [email or web address]   08/15/2020, 7:08 PM

## 2020-08-15 NOTE — ED Provider Notes (Signed)
[facility] DEPT Provider Note   CSN: [id number] Arrival date & time: 08/15/20  1037     History Chief Complaint  Patient presents with  . Shortness of Breath  . Dizziness    Jessica Rodgers is a 45 y.o. female.  Patient presents to ER chief complaint of 1 week of shortness of breath.  She states that it has not been getting any better.  Denies any chest pain or chest tightness.  Denies fevers or cough.  She states that she is also complaining of some generalized malaise and lightheadedness.  No loss of consciousness reported.  No loss of smell or taste and no vomiting or diarrhea.  Seen in urgent care, sent to the ER for further evaluation.        Past Medical History:  Diagnosis Date  . GERD (gastroesophageal reflux disease)   . Hypertension     Patient Active Problem List   Diagnosis Date Noted  . Urinary tract infection, site not specified 09/19/2017  . Enterocolitis 11/06/2016  . Sepsis ([place]) 11/06/2016  . Pyelonephritis 11/05/2016    Past Surgical History:  Procedure Laterality Date  . BREAST EXCISIONAL BIOPSY Right 04/07/2017   PASH  . RADIOACTIVE SEED GUIDED EXCISIONAL BREAST BIOPSY Right 04/07/2017   Procedure: RIGHT RADIOACTIVE SEED GUIDED EXCISIONAL BREAST BIOPSY;  Surgeon: Rolm Bookbinder, MD;  Location: [facility];  Service: General;  Laterality: Right;  . TUBAL LIGATION  1997     OB History   No obstetric history on file.     Family History  Problem Relation Age of Onset  . Hypertension Mother   . Arthritis Mother   . Glaucoma Mother   . Other Father        unsure of his health  . Breast cancer Maternal Grandmother 25    Social History   Tobacco Use  . Smoking status: Former Smoker    Packs/day: 0.50    Years: 18.00    Pack years: 9.00    Types: Cigarettes    Start date: 02/13/2003    Quit date: 08/09/2020    Years since quitting: 0.0  . Smokeless tobacco: Never Used  . Tobacco  comment: Try to decrease number of cigs smoked  Vaping Use  . Vaping Use: Never used  Substance Use Topics  . Alcohol use: Yes  . Drug use: No    Home Medications Prior to Admission medications   Medication Sig Start Date End Date Taking? Authorizing Provider  Cholecalciferol (VITAMIN D PO) Take 1 tablet by mouth daily. 5,000u    [provider]  Phentermine-Topiramate (QSYMIA) 11.25-69 MG CP24 Take by mouth.    [provider]  valsartan-hydrochlorothiazide (DIOVAN-HCT) 80-12.5 MG tablet Take 1 tablet by mouth daily. 06/05/20   Glendale Chard, MD    Allergies    Patient has no known allergies.  Review of Systems   Review of Systems  Constitutional: Negative for fever.  HENT: Negative for ear pain.   Eyes: Negative for pain.  Respiratory: Positive for shortness of breath. Negative for cough.   Cardiovascular: Negative for chest pain.  Gastrointestinal: Negative for abdominal pain.  Genitourinary: Negative for flank pain.  Musculoskeletal: Negative for back pain.  Skin: Negative for rash.  Neurological: Negative for headaches.    Physical Exam Updated Vital Signs BP 120/75   Pulse 92   Temp 97.8 F (36.6 C) (Oral)   Resp (!) 23   Ht 5' 2.5" (1.588 m)   [facility]  89.8 kg   LMP 08/15/2020   SpO2 99%   BMI 35.64 kg/m   Physical Exam Constitutional:      General: She is not in acute distress.    Appearance: Normal appearance.  HENT:     Head: Normocephalic.     Nose: Nose normal.  Eyes:     Extraocular Movements: Extraocular movements intact.  Cardiovascular:     Rate and Rhythm: Normal rate.  Pulmonary:     Effort: Tachypnea and accessory muscle usage present.     Breath sounds: No wheezing.  Musculoskeletal:        General: Normal range of motion.     Cervical back: Normal range of motion.     Right lower leg: No tenderness. No edema.     Left lower leg: No tenderness. No edema.  Neurological:     General: No focal deficit present.     Mental  Status: She is alert. Mental status is at baseline.     ED Results / Procedures / Treatments   Labs (all labs ordered are listed, but only abnormal results are displayed) Labs Reviewed  BASIC METABOLIC PANEL - Abnormal; Notable for the following components:      Result Value   Glucose, Bld 109 (*)    Creatinine, Ser 1.29 (*)    GFR, Estimated 52 (*)    All other components within normal limits  CBC - Abnormal; Notable for the following components:   MCV 103.9 (*)    MCH 35.2 (*)    All other components within normal limits  BRAIN NATRIURETIC PEPTIDE - Abnormal; Notable for the following components:   B Natriuretic Peptide 523.5 (*)    All other components within normal limits  TROPONIN I (HIGH SENSITIVITY) - Abnormal; Notable for the following components:   Troponin I (High Sensitivity) 23 (*)    All other components within normal limits  SARS CORONAVIRUS 2 (TAT 6-24 HRS)  D-DIMER, QUANTITATIVE  I-STAT BETA HCG BLOOD, ED ([facility], [facility], [facility] ONLY)  TROPONIN I (HIGH SENSITIVITY)    EKG EKG Interpretation  Date/Time:  Friday August 15 2020 10:49:24 EDT Ventricular Rate:  81 PR Interval:  172 QRS Duration: 90 QT Interval:  379 QTC Calculation: 440 R Axis:   152 Text Interpretation: Sinus rhythm Probable anterior infarct, age indeterminate 64 Lead; [place] Confirmed by Thamas Jaegers ([id number]) on 08/15/2020 11:04:02 AM   Radiology DG Chest Port 1 View  Result Date: 08/15/2020 CLINICAL DATA:  Shortness of breath. 45 year old female with 1 week of dizziness and shortness of breath. EXAM: PORTABLE CHEST 1 VIEW COMPARISON:  November 09, 2016 FINDINGS: Trachea midline. Cardiomediastinal contours and hilar structures are normal. Lungs are clear. Elevated RIGHT hemidiaphragm is similar to the prior study. On limited assessment no acute skeletal process. IMPRESSION: No acute cardiopulmonary disease. Electronically Signed   By: Zetta Bills M.D.   On: 08/15/2020 12:43    Procedures Procedures    Medications Ordered in ED Medications - No data to display  ED Course  I have reviewed the triage vital signs and the nursing notes.  Pertinent labs & imaging results that were available during my care of the patient were reviewed by me and considered in my medical decision making (see chart for details).    MDM Rules/Calculators/A&P                          Labs are sent white count normal at 9 chemistry unremarkable.  Troponin mildly elevated 23 and repeat pending.  proBNP mildly elevated 500 chest x-ray is unremarkable.  Given symptoms unclear etiology, COVID sent and pending result.  D-dimer also added on as she appears to be tachypneic when she ambulates more than 4 to 5 feet.  Will be signed out to oncoming physician provider. Final Clinical Impression(s) / ED Diagnoses Final diagnoses:  None    Rx / DC Orders ED Discharge Orders    None       Luna Fuse, MD 08/15/20 1435

## 2020-08-15 NOTE — ED Provider Notes (Signed)
[facility]    CSN: [id number] Arrival date & time: 08/15/20  0845      History   Chief Complaint Chief Complaint  Patient presents with  . Shortness of Breath  . Dizziness    HPI Jessica Rodgers is a 45 y.o. female presenting with 1 week of dizziness and shortness of breath, getting worse.  Medical history current smoker, GERD, hypertension, pyelonephritis.  Patient states that she has been experiencing shortness of breath with exertion, generalized abdominal discomfort, dizziness with sitting to standing for about 1 week.  Symptoms are getting worse.  She was seen by her primary care on 5/26 and lab work was completed with few abnormal results.  PCP referred her to cardiology for echo, but patient states she cannot wait for this appointment as her symptoms are getting worse.  Describes the dizziness as lightheadedness that lasts for about 15 seconds following going from sitting to standing.  She is still experiencing some dyspepsia, somewhat relieved by burping.  Denies URI symptoms including cough, fever/chills, nasal congestion, body aches.  Patient states she does not have any pulmonary conditions and has not been prescribed any inhalers or similar from her primary care.  She is taking her antihypertensives as directed.  Denies chest pain, left arm pain, jaw pain.   HPI  Past Medical History:  Diagnosis Date  . GERD (gastroesophageal reflux disease)   . Hypertension     Patient Active Problem List   Diagnosis Date Noted  . Urinary tract infection, site not specified 09/19/2017  . Enterocolitis 11/06/2016  . Sepsis ([place]) 11/06/2016  . Pyelonephritis 11/05/2016    Past Surgical History:  Procedure Laterality Date  . BREAST EXCISIONAL BIOPSY Right 04/07/2017   PASH  . RADIOACTIVE SEED GUIDED EXCISIONAL BREAST BIOPSY Right 04/07/2017   Procedure: RIGHT RADIOACTIVE SEED GUIDED EXCISIONAL BREAST BIOPSY;  Surgeon: Rolm Bookbinder, MD;  Location: [facility];  Service: General;  Laterality: Right;  . TUBAL LIGATION  1997    OB History   No obstetric history on file.      Home Medications    Prior to Admission medications   Medication Sig Start Date End Date Taking? Authorizing Provider  Cholecalciferol (VITAMIN D PO) Take 1 tablet by mouth daily. 5,000u    [provider]  Phentermine-Topiramate (QSYMIA) 11.25-69 MG CP24 Take by mouth.    [provider]  valsartan-hydrochlorothiazide (DIOVAN-HCT) 80-12.5 MG tablet Take 1 tablet by mouth daily. 06/05/20   Glendale Chard, MD    Family History Family History  Problem Relation Age of Onset  . Hypertension Mother   . Arthritis Mother   . Glaucoma Mother   . Other Father        unsure of his health  . Breast cancer Maternal Grandmother 88    Social History Social History   Tobacco Use  . Smoking status: Former Smoker    Packs/day: 0.50    Years: 18.00    Pack years: 9.00    Types: Cigarettes    Start date: 02/13/2003    Quit date: 08/09/2020    Years since quitting: 0.0  . Smokeless tobacco: Never Used  . Tobacco comment: Try to decrease number of cigs smoked  Vaping Use  . Vaping Use: Never used  Substance Use Topics  . Alcohol use: Yes  . Drug use: No     Allergies   Patient has no known allergies.   Review of Systems Review of Systems  Constitutional: Negative  for appetite change, chills and fever.  HENT: Negative for congestion, ear pain, rhinorrhea, sinus pressure, sinus pain and sore throat.   Eyes: Negative for redness and visual disturbance.  Respiratory: Positive for shortness of breath. Negative for cough, chest tightness and wheezing.   Cardiovascular: Negative for chest pain and palpitations.  Gastrointestinal: Positive for abdominal pain. Negative for constipation, diarrhea, nausea and vomiting.  Genitourinary: Negative for dysuria, frequency and urgency.  Musculoskeletal: Negative for myalgias.  Neurological:  Positive for dizziness. Negative for weakness and headaches.  Psychiatric/Behavioral: Negative for confusion.  All other systems reviewed and are negative.    Physical Exam Triage Vital Signs ED Triage Vitals  Enc Vitals Group     BP 08/15/20 0937 99/73     Pulse Rate 08/15/20 0937 93     Resp 08/15/20 0937 20     Temp 08/15/20 0937 98.4 F (36.9 C)     Temp Source 08/15/20 0937 Oral     SpO2 08/15/20 0937 96 %     Weight --      Height --      Head Circumference --      Peak Flow --      Pain Score 08/15/20 0932 0     Pain Loc --      Pain Edu? --      Excl. in [place]? --    No data found.  Updated Vital Signs BP 108/78 (BP Location: Left Arm)   Pulse 93   Temp 98.4 F (36.9 C) (Oral)   Resp 20   LMP 07/19/2020 (LMP Unknown)   SpO2 96%   Visual Acuity Right Eye Distance:   Left Eye Distance:   Bilateral Distance:    Right Eye Near:   Left Eye Near:    Bilateral Near:     Physical Exam Vitals reviewed.  Constitutional:      Appearance: Normal appearance. She is not diaphoretic.  HENT:     Head: Normocephalic and atraumatic.     Mouth/Throat:     Mouth: Mucous membranes are moist.  Eyes:     Extraocular Movements: Extraocular movements intact.     Pupils: Pupils are equal, round, and reactive to light.     Comments: PERRLA, EOMI  Cardiovascular:     Rate and Rhythm: Normal rate and regular rhythm.     Pulses:          Radial pulses are 2+ on the right side and 2+ on the left side.     Heart sounds: Normal heart sounds.  Pulmonary:     Effort: Pulmonary effort is normal.     Breath sounds: Normal breath sounds. No decreased breath sounds, wheezing, rhonchi or rales.  Abdominal:     Palpations: Abdomen is soft.     Tenderness: There is no abdominal tenderness. There is no guarding or rebound.  Musculoskeletal:     Right lower leg: No edema.     Left lower leg: No edema.  Skin:    General: Skin is warm.     Capillary Refill: Capillary refill takes  less than 2 seconds.  Neurological:     General: No focal deficit present.     Mental Status: She is alert and oriented to person, place, and time.     Comments: CN 2-12 grossly intact  Psychiatric:        Mood and Affect: Mood normal.        Behavior: Behavior normal.  Thought Content: Thought content normal.        Judgment: Judgment normal.      UC Treatments / Results  Labs (all labs ordered are listed, but only abnormal results are displayed) Labs Reviewed - No data to display  EKG   Radiology No results found.  Procedures Procedures (including critical care time)  Medications Ordered in UC Medications - No data to display  Initial Impression / Assessment and Plan / UC Course  I have reviewed the triage vital signs and the nursing notes.  Pertinent labs & imaging results that were available during my care of the patient were reviewed by me and considered in my medical decision making (see chart for details).     This patient is a 45 year old female presenting with few weeks of dizziness, shortness of breath, getting worse.  Today she is afebrile, nontachycardic nontachypneic oxygenting well on room air. She is a current smoker.  This patient has a history of hypertension, obesity, and tobacco abuse. On 5/26 PCP checked a B12, CBC, lipid panel, CMP 1 week ago; results are fairly wnl. For hypertension, she is taking the valsartan/hydrochlorothiazide.  She denies history of pulmonary conditions and does not use any inhalers.  EKG with T wave inversions throughout changed from 2021 EKG. unfortunately, primary care did not do an EKG on 08/07/20, so I have no recent EKG for comparison.   Given EKG changes and shortness of breath and dizziness that is getting worse, I am sending this patient to the emergency department for further evaluation and management.  I suspect her symptoms are pulmonary in nature, but I cannot exclude a cardiac event.  Patient verbalizes  understanding and agreement.  She is hemodynamically stable for transfer in personal vehicle at this time.  Final Clinical Impressions(s) / UC Diagnoses   Final diagnoses:  Nonspecific abnormal electrocardiogram (ECG) (EKG)  Current smoker  Essential hypertension  Dizziness  Shortness of breath     Discharge Instructions     -Head straight to the emergency department for further evaluation and management of dizziness and shortness of breath.  You have changes on EKG that indicate your heart is struggling for oxygen.  These changes can indicate a pulmonary issue, but they can also indicate changes that happen early in a heart attack.  I cannot rule out a cardiac event at this urgent care, so you must head to the emergency department for this evaluation.  If you experience worsening of symptoms on the way, like dizziness, shortness of breath, chest pain-stop and call 911    ED Prescriptions    None     PDMP not reviewed this encounter.   Hazel Sams, PA-C 08/15/20 1011

## 2020-08-15 NOTE — ED Triage Notes (Signed)
Patient went to [facility] [place] [facility] this AM. Patient c/o dizziness, SOB with exertion x 1 week  and indigestion that has subsided today.

## 2020-08-16 ENCOUNTER — Observation Stay (HOSPITAL_COMMUNITY): Payer: BC Managed Care – PPO

## 2020-08-16 DIAGNOSIS — Z8249 Family history of ischemic heart disease and other diseases of the circulatory system: Secondary | ICD-10-CM | POA: Diagnosis not present

## 2020-08-16 DIAGNOSIS — I82432 Acute embolism and thrombosis of left popliteal vein: Secondary | ICD-10-CM | POA: Diagnosis present

## 2020-08-16 DIAGNOSIS — I2694 Multiple subsegmental pulmonary emboli without acute cor pulmonale: Secondary | ICD-10-CM | POA: Diagnosis not present

## 2020-08-16 DIAGNOSIS — E669 Obesity, unspecified: Secondary | ICD-10-CM | POA: Diagnosis present

## 2020-08-16 DIAGNOSIS — Z83511 Family history of glaucoma: Secondary | ICD-10-CM | POA: Diagnosis not present

## 2020-08-16 DIAGNOSIS — I10 Essential (primary) hypertension: Secondary | ICD-10-CM | POA: Diagnosis present

## 2020-08-16 DIAGNOSIS — I2699 Other pulmonary embolism without acute cor pulmonale: Secondary | ICD-10-CM

## 2020-08-16 DIAGNOSIS — I824Y2 Acute embolism and thrombosis of unspecified deep veins of left proximal lower extremity: Secondary | ICD-10-CM | POA: Diagnosis not present

## 2020-08-16 DIAGNOSIS — Z8261 Family history of arthritis: Secondary | ICD-10-CM | POA: Diagnosis not present

## 2020-08-16 DIAGNOSIS — Z87891 Personal history of nicotine dependence: Secondary | ICD-10-CM | POA: Diagnosis not present

## 2020-08-16 DIAGNOSIS — Z20822 Contact with and (suspected) exposure to covid-19: Secondary | ICD-10-CM | POA: Diagnosis present

## 2020-08-16 DIAGNOSIS — I272 Pulmonary hypertension, unspecified: Secondary | ICD-10-CM | POA: Diagnosis present

## 2020-08-16 DIAGNOSIS — Z6835 Body mass index (BMI) 35.0-35.9, adult: Secondary | ICD-10-CM | POA: Diagnosis not present

## 2020-08-16 DIAGNOSIS — Z79899 Other long term (current) drug therapy: Secondary | ICD-10-CM | POA: Diagnosis not present

## 2020-08-16 DIAGNOSIS — I2602 Saddle embolus of pulmonary artery with acute cor pulmonale: Secondary | ICD-10-CM

## 2020-08-16 DIAGNOSIS — N179 Acute kidney failure, unspecified: Secondary | ICD-10-CM | POA: Diagnosis present

## 2020-08-16 LAB — BASIC METABOLIC PANEL
Anion gap: 8 (ref 5–15)
BUN: 17 mg/dL (ref 6–20)
CO2: 22 mmol/L (ref 22–32)
Calcium: 9 mg/dL (ref 8.9–10.3)
Chloride: 106 mmol/L (ref 98–111)
Creatinine, Ser: 1.07 mg/dL — ABNORMAL HIGH (ref 0.44–1.00)
GFR, Estimated: >60 mL/min (ref >60)
Glucose, Bld: 162 mg/dL — ABNORMAL HIGH (ref 70–99)
Potassium: 3.5 mmol/L (ref 3.5–5.1)
Sodium: 136 mmol/L (ref 135–145)

## 2020-08-16 LAB — CBC
HCT: 39.8 % (ref 36.0–46.0)
Hemoglobin: 13.6 g/dL (ref 12.0–15.0)
MCH: 35.2 pg — ABNORMAL HIGH (ref 26.0–34.0)
MCHC: 34.2 g/dL (ref 30.0–36.0)
MCV: 103.1 fL — ABNORMAL HIGH (ref 80.0–100.0)
Platelets: 213 10*3/uL (ref 150–400)
RBC: 3.86 MIL/uL — ABNORMAL LOW (ref 3.87–5.11)
RDW: 11.8 % (ref 11.5–15.5)
WBC: 9.2 10*3/uL (ref 4.0–10.5)
nRBC: 0 % (ref 0.0–0.2)

## 2020-08-16 LAB — ECHOCARDIOGRAM COMPLETE
AR max vel: 2.07 cm2
AV Area VTI: 2.15 cm2
AV Area mean vel: 2.13 cm2
AV Mean grad: 3 mmHg
AV Peak grad: 6.6 mmHg
Ao pk vel: 1.28 m/s
Area-P 1/2: 3.99 cm2
Height: 63 in
S' Lateral: 2.3 cm
Weight: 3181.68 oz

## 2020-08-16 LAB — HEPARIN LEVEL (UNFRACTIONATED)
Heparin Unfractionated: 0.63 IU/mL (ref 0.30–0.70)
Heparin Unfractionated: 0.8 IU/mL — ABNORMAL HIGH (ref 0.30–0.70)
Heparin Unfractionated: 0.39 IU/mL (ref 0.30–0.70)

## 2020-08-16 LAB — HIV ANTIBODY (ROUTINE TESTING W REFLEX): HIV Screen 4th Generation wRfx: NONREACTIVE

## 2020-08-16 NOTE — Progress Notes (Addendum)
Patient ID: Jessica Rodgers, female   DOB: 12/26/1975, 45 y.o.   MRN: [id number]  PROGRESS NOTE    Jessica Rodgers  TIW:[id number] DOB: [birth year] DOA: 08/15/2020 PCP: Glendale Chard, MD   Brief Narrative:  45 year old female with history of hypertension, GERD, tobacco use and obesity presented with increasing shortness of breath.  On presentation, CTA chest showed extensive bilateral pulmonary embolism with associated right heart strain.  Assessment & Plan:   Acute bilateral pulmonary embolism with right heart strain Tobacco use -Probably secondary to tobacco use, sedentary job and phentermine topiramate use. -Currently on heparin drip.  Continue heparin drip for today.  Probably switch to oral Eliquis or Xarelto tomorrow.  TOC consult to see if she needs help regarding Eliquis or Xarelto -Follow 2D echo and lower extremity duplex ultrasound  Acute kidney injury -Creatinine 1.29 on presentation.  Improving to 1.07 today.  DC IV fluids  Hypertension -Blood pressure on the lower side.  Home regimen on hold.  Obesity --hold phentermine-topirmate in light of PE-need to consider alternative weight loss method  DVT prophylaxis: Heparin drip Code Status: Full Family Communication: None at bedside Disposition Plan: Status is: Observation  The patient will require care spanning > 2 midnights and should be moved to inpatient because: Inpatient level of care appropriate due to severity of illness  Dispo: The patient is from: Home              Anticipated d/c is to: Home              Patient currently is not medically stable to d/c.   Difficult to place patient No   Consultants: None  Procedures: None  Antimicrobials: None   Subjective: Patient seen and examined at bedside.  Denies any current chest pain.  Still slightly short of breath with exertion.  No overnight fever or vomiting reported.  Objective: Vitals:   08/15/20 2139 08/16/20 0136 08/16/20 0552 08/16/20 0944   BP: 110/80 94/72 103/71 107/75  Pulse: 92 83 82 86  Resp: 18 18 20 18   Temp: 98.6 F (37 C) 98.4 F (36.9 C) 98 F (36.7 C) 97.7 F (36.5 C)  TempSrc: Oral Oral Oral Oral  SpO2: 94% 96% 96% 96%  Weight:      Height:        Intake/Output Summary (Last 24 hours) at 08/16/2020 1133 Last data filed at 08/16/2020 0600 Gross per 24 hour  Intake 657.23 ml  Output --  Net 657.23 ml   Filed Weights   08/15/20 1053 08/15/20 2127  Weight: 89.8 kg 90.2 kg    Examination:  General exam: Appears calm and comfortable.  Currently on room air. Respiratory system: Bilateral decreased breath sounds at bases Cardiovascular system: S1 & S2 heard, Rate controlled Gastrointestinal system: Abdomen is nondistended, soft and nontender. Normal bowel sounds heard. Extremities: No cyanosis, clubbing, edema  Central nervous system: Alert and oriented. No focal neurological deficits. Moving extremities Skin: No rashes, lesions or ulcers Psychiatry: Judgement and insight appear normal. Mood & affect appropriate.     Data Reviewed: I have personally reviewed following labs and imaging studies  CBC: Recent Labs  Lab 08/15/20 1056 08/16/20 0032  WBC 9.2 9.2  HGB 14.5 13.6  HCT 42.8 39.8  MCV 103.9* 103.1*  PLT 230 250   Basic Metabolic Panel: Recent Labs  Lab 08/15/20 1056 08/16/20 0032  NA 138 136  K 3.8 3.5  CL 108 106  CO2 23 22  GLUCOSE 109* 162*  BUN 19 17  CREATININE 1.29* 1.07*  CALCIUM 9.1 9.0   GFR: Estimated Creatinine Clearance: 69.3 mL/min (A) (by C-G formula based on SCr of 1.07 mg/dL (H)). Liver Function Tests: No results for input(s): AST, ALT, ALKPHOS, BILITOT, PROT, ALBUMIN in the last 168 hours. No results for input(s): LIPASE, AMYLASE in the last 168 hours. No results for input(s): AMMONIA in the last 168 hours. Coagulation Profile: Recent Labs  Lab 08/15/20 1808  INR 0.9   Cardiac Enzymes: No results for input(s): CKTOTAL, CKMB, CKMBINDEX, TROPONINI in  the last 168 hours. BNP (last 3 results) No results for input(s): PROBNP in the last 8760 hours. HbA1C: No results for input(s): HGBA1C in the last 72 hours. CBG: No results for input(s): GLUCAP in the last 168 hours. Lipid Profile: No results for input(s): CHOL, HDL, LDLCALC, TRIG, CHOLHDL, LDLDIRECT in the last 72 hours. Thyroid Function Tests: No results for input(s): TSH, T4TOTAL, FREET4, T3FREE, THYROIDAB in the last 72 hours. Anemia Panel: No results for input(s): VITAMINB12, FOLATE, FERRITIN, TIBC, IRON, RETICCTPCT in the last 72 hours. Sepsis Labs: No results for input(s): PROCALCITON, LATICACIDVEN in the last 168 hours.  Recent Results (from the past 240 hour(s))  SARS CORONAVIRUS 2 (TAT 6-24 HRS) Nasopharyngeal Nasopharyngeal Swab     Status: None   Collection Time: 08/15/20 12:26 PM   Specimen: Nasopharyngeal Swab  Result Value Ref Range Status   SARS Coronavirus 2 NEGATIVE NEGATIVE Final    Comment: (NOTE) SARS-CoV-2 target nucleic acids are NOT DETECTED.  The SARS-CoV-2 RNA is generally detectable in upper and lower respiratory specimens during the acute phase of infection. Negative results do not preclude SARS-CoV-2 infection, do not rule out co-infections with other pathogens, and should not be used as the sole basis for treatment or other patient management decisions. Negative results must be combined with clinical observations, patient history, and epidemiological information. The expected result is Negative.  Fact Sheet for Patients: SugarRoll.be  Fact Sheet for Healthcare Providers: https://www.woods-mathews.com/  This test is not yet approved or cleared by the [place] FDA and  has been authorized for detection and/or diagnosis of SARS-CoV-2 by FDA under an Emergency Use Authorization (EUA). This EUA will remain  in effect (meaning this test can be used) for the duration of the COVID-19 declaration under Se  ction 564(b)(1) of the Act, 21 U.S.C. section 360bbb-3(b)(1), unless the authorization is terminated or revoked sooner.  Performed at [facility], [place] [address], [place], [place] [address]          Radiology Studies: CT Angio Chest PE W and/or Wo Contrast  Result Date: 08/15/2020 CLINICAL DATA:  Shortness of breath with dizziness and abdominal discomfort x1 week. EXAM: CT ANGIOGRAPHY CHEST WITH CONTRAST TECHNIQUE: Multidetector CT imaging of the chest was performed using the standard protocol during bolus administration of intravenous contrast. Multiplanar CT image reconstructions and MIPs were obtained to evaluate the vascular anatomy. CONTRAST:  156mL OMNIPAQUE IOHEXOL 350 MG/ML SOLN COMPARISON:  None. FINDINGS: Cardiovascular: An extensive amount of intraluminal low attenuation is seen involving all branches of the bilateral pulmonary arteries. There is no evidence of saddle embolus. Normal heart size with mild right-sided heart strain. No pericardial effusion. Mediastinum/Nodes: No enlarged mediastinal, hilar, or axillary lymph nodes. Thyroid gland, trachea, and esophagus demonstrate no significant findings. Lungs/Pleura: Lungs are clear. No pleural effusion or pneumothorax. Upper Abdomen: Tiny gallstones are seen within the gallbladder lumen. Musculoskeletal: No chest wall abnormality. No acute or significant osseous findings. Review of  the MIP images confirms the above findings. IMPRESSION: 1. Extensive bilateral pulmonary embolism with associated right heart strain. 2. Cholelithiasis. Electronically Signed   By: Virgina Norfolk M.D.   On: 08/15/2020 17:49   DG Chest Port 1 View  Result Date: 08/15/2020 CLINICAL DATA:  Shortness of breath. 45 year old female with 1 week of dizziness and shortness of breath. EXAM: PORTABLE CHEST 1 VIEW COMPARISON:  November 09, 2016 FINDINGS: Trachea midline. Cardiomediastinal contours and hilar structures are normal. Lungs are clear. Elevated RIGHT  hemidiaphragm is similar to the prior study. On limited assessment no acute skeletal process. IMPRESSION: No acute cardiopulmonary disease. Electronically Signed   By: Zetta Bills M.D.   On: 08/15/2020 12:43        Scheduled Meds: Continuous Infusions: . heparin 1,150 Units/hr (08/16/20 0954)          Aline August, MD [facility] 08/16/2020, 11:33 AM

## 2020-08-16 NOTE — Progress Notes (Signed)
[name] for IV heparin  Indication: pulmonary embolus  No Known Allergies  Patient Measurements: Height: 5\' 3"  (160 cm) Weight: 90.2 kg (198 lb 13.7 oz) IBW/kg (Calculated) : 52.4 Heparin Dosing Weight: 71.8 kg  Vital Signs: Temp: 98 F (36.7 C) (06/04 0552) Temp Source: Oral (06/04 0552) BP: 103/71 (06/04 0552) Pulse Rate: 82 (06/04 0552)  Labs: Recent Labs    08/15/20 1056 08/15/20 1256 08/15/20 1808 08/16/20 0032 08/16/20 0758  HGB 14.5  --   --  13.6  --   HCT 42.8  --   --  39.8  --   PLT 230  --   --  213  --   APTT  --   --  29  --   --   LABPROT  --   --  12.5  --   --   INR  --   --  0.9  --   --   HEPARINUNFRC  --   --   --  0.63 0.80*  CREATININE 1.29*  --   --  1.07*  --   TROPONINIHS 23* 26*  --   --   --     Estimated Creatinine Clearance: 69.3 mL/min (A) (by C-G formula based on SCr of 1.07 mg/dL (H)).   Medical History: Past Medical History:  Diagnosis Date  . GERD (gastroesophageal reflux disease)   . Hypertension     Assessment: 45 year old female with PMH of GERD, HTN, tobacco abuse presenting to [facility] ED on 08/15/2020 with 1 week of SOB, dizziness. Rodgers-dimer elevated. CTa chest positive for bilateral PE with right heart strain. Pharmacy consulted for IV heparin dosing. Patient not on anticoagulants PTA. H/H, Pltc, INR, aPTT WNL.   08/16/2020:  Heparin level 0.8- supra-therapeutic on IV heparin infusion at 1250 units/hr  CBC WNL  Pt is on menstrual cycle, but RN reports no unexpected bleeding   No infusion related issues reported by RN  Goal of Therapy:  Heparin level 0.3-0.7 units/ml Monitor platelets by anticoagulation protocol: Yes   Plan:   Reduce heparin infusion to 1150 units/hr  Repeat heparin level in 6 hours   Daily CBC, heparin level   Monitor closely for s/sx of bleeding  Jessica Rodgers   08/16/2020,9:28 AM

## 2020-08-16 NOTE — Progress Notes (Signed)
[facility] for IV heparin  Indication: pulmonary embolus  No Known Allergies  Patient Measurements: Height: 5\' 3"  (160 cm) Weight: 90.2 kg (198 lb 13.7 oz) IBW/kg (Calculated) : 52.4 Heparin Dosing Weight: 71.8 kg  Vital Signs: Temp: 98.4 F (36.9 C) (06/04 0136) Temp Source: Oral (06/04 0136) BP: 94/72 (06/04 0136) Pulse Rate: 83 (06/04 0136)  Labs: Recent Labs    08/15/20 1056 08/15/20 1256 08/15/20 1808 08/16/20 0032  HGB 14.5  --   --  13.6  HCT 42.8  --   --  39.8  PLT 230  --   --  213  APTT  --   --  29  --   LABPROT  --   --  12.5  --   INR  --   --  0.9  --   HEPARINUNFRC  --   --   --  0.63  CREATININE 1.29*  --   --  1.07*  TROPONINIHS 23* 26*  --   --     Estimated Creatinine Clearance: 69.3 mL/min (A) (by C-G formula based on SCr of 1.07 mg/dL (H)).   Medical History: Past Medical History:  Diagnosis Date  . GERD (gastroesophageal reflux disease)   . Hypertension     Assessment: 45 year old female with PMH of GERD, HTN, tobacco abuse presenting to [facility] ED on 08/15/2020 with 1 week of SOB, dizziness. D-dimer elevated. CTa chest positive for bilateral PE with right heart strain. Pharmacy consulted for IV heparin dosing. Patient not on anticoagulants PTA. H/H, Pltc, INR, aPTT WNL.   08/16/2020:  Heparin level 0.63- therapeutic on IV heparin infusion at 1250 units/hr  CBC WNL  Pt is on menstrual cycle, but RN reports no unexpected bleeding   No infusion related issues reported by RN  Goal of Therapy:  Heparin level 0.3-0.7 units/ml Monitor platelets by anticoagulation protocol: Yes   Plan:   Continue heparin infusion at 1250 units/hr  Repeat heparin level in 6 hours   Daily CBC, heparin level   Monitor closely for s/sx of bleeding  Netta Cedars, PharmD, BCPS Clinical Pharmacist  08/16/2020,2:01 AM

## 2020-08-16 NOTE — Progress Notes (Signed)
Bilateral lower extremity venous duplex has been completed. Preliminary results can be found in CV Proc through chart review.  Results were given to the patient's nurse, Kim.  08/16/20 1:25 PM Jessica Rodgers RVT

## 2020-08-16 NOTE — Progress Notes (Signed)
  Echocardiogram 2D Echocardiogram has been performed.  Merrie Roof F 08/16/2020, 9:24 AM

## 2020-08-16 NOTE — Progress Notes (Signed)
[place] for IV heparin  Indication: pulmonary embolus  No Known Allergies  Patient Measurements: Height: 5\' 3"  (160 cm) Weight: 90.2 kg (198 lb 13.7 oz) IBW/kg (Calculated) : 52.4 Heparin Dosing Weight: 71.8 kg  Vital Signs: Temp: 97.3 F (36.3 C) (06/04 1430) Temp Source: Oral (06/04 1430) BP: 96/69 (06/04 1430) Pulse Rate: 88 (06/04 1430)  Labs: Recent Labs    08/15/20 1056 08/15/20 1256 08/15/20 1808 08/16/20 0032 08/16/20 0758 08/16/20 1812  HGB 14.5  --   --  13.6  --   --   HCT 42.8  --   --  39.8  --   --   PLT 230  --   --  213  --   --   APTT  --   --  29  --   --   --   LABPROT  --   --  12.5  --   --   --   INR  --   --  0.9  --   --   --   HEPARINUNFRC  --   --   --  0.63 0.80* 0.39  CREATININE 1.29*  --   --  1.07*  --   --   TROPONINIHS 23* 26*  --   --   --   --     Estimated Creatinine Clearance: 69.3 mL/min (A) (by C-G formula based on SCr of 1.07 mg/dL (H)).   Medical History: Past Medical History:  Diagnosis Date  . GERD (gastroesophageal reflux disease)   . Hypertension     Assessment: 45 year old female with PMH of GERD, HTN, tobacco abuse presenting to [facility] ED on 08/15/2020 with 1 week of SOB, dizziness. D-dimer elevated. CTa chest positive for bilateral PE with right heart strain. Pharmacy consulted for IV heparin dosing. Patient not on anticoagulants PTA. H/H, Pltc, INR, aPTT WNL.   08/16/2020:  Heparin level 0.39 - therapeutic on IV heparin infusion at 1150 units/hr  CBC WNL  Pt is on menstrual cycle, but RN reports no unexpected bleeding   No infusion related issues reported by RN  Goal of Therapy:  Heparin level 0.3-0.7 units/ml Monitor platelets by anticoagulation protocol: Yes   Plan:   Continue heparin infusion at 1150 units/hr  Daily CBC, heparin level   Monitor closely for s/sx of bleeding  Axelle Szwed P. Legrand Como, PharmD, [place] Please utilize Amion  for appropriate phone number to reach the unit pharmacist ([facility]) 08/16/2020 6:45 PM

## 2020-08-17 DIAGNOSIS — I82409 Acute embolism and thrombosis of unspecified deep veins of unspecified lower extremity: Secondary | ICD-10-CM

## 2020-08-17 DIAGNOSIS — I2694 Multiple subsegmental pulmonary emboli without acute cor pulmonale: Secondary | ICD-10-CM

## 2020-08-17 LAB — CBC
HCT: 38.1 % (ref 36.0–46.0)
Hemoglobin: 12.7 g/dL (ref 12.0–15.0)
MCH: 35.6 pg — ABNORMAL HIGH (ref 26.0–34.0)
MCHC: 33.3 g/dL (ref 30.0–36.0)
MCV: 106.7 fL — ABNORMAL HIGH (ref 80.0–100.0)
Platelets: 195 10*3/uL (ref 150–400)
RBC: 3.57 MIL/uL — ABNORMAL LOW (ref 3.87–5.11)
RDW: 11.7 % (ref 11.5–15.5)
WBC: 6.2 10*3/uL (ref 4.0–10.5)
nRBC: 0 % (ref 0.0–0.2)

## 2020-08-17 LAB — BASIC METABOLIC PANEL
Anion gap: 7 (ref 5–15)
BUN: 16 mg/dL (ref 6–20)
CO2: 24 mmol/L (ref 22–32)
Calcium: 8.6 mg/dL — ABNORMAL LOW (ref 8.9–10.3)
Chloride: 108 mmol/L (ref 98–111)
Creatinine, Ser: 1.29 mg/dL — ABNORMAL HIGH (ref 0.44–1.00)
GFR, Estimated: 52 mL/min — ABNORMAL LOW (ref >60)
Glucose, Bld: 119 mg/dL — ABNORMAL HIGH (ref 70–99)
Potassium: 3.8 mmol/L (ref 3.5–5.1)
Sodium: 139 mmol/L (ref 135–145)

## 2020-08-17 LAB — HEPARIN LEVEL (UNFRACTIONATED): Heparin Unfractionated: 0.53 IU/mL (ref 0.30–0.70)

## 2020-08-17 LAB — MAGNESIUM: Magnesium: 1.8 mg/dL (ref 1.7–2.4)

## 2020-08-17 NOTE — Progress Notes (Signed)
[place] for IV heparin  Indication: pulmonary embolus  No Known Allergies  Patient Measurements: Height: 5\' 3"  (160 cm) Weight: 90.2 kg (198 lb 13.7 oz) IBW/kg (Calculated) : 52.4 Heparin Dosing Weight: 71.8 kg  Vital Signs: Temp: 97.6 F (36.4 C) (06/05 0516) Temp Source: Oral (06/05 0516) BP: 92/69 (06/05 0516) Pulse Rate: 78 (06/05 0516)  Labs: Recent Labs    08/15/20 1056 08/15/20 1056 08/15/20 1256 08/15/20 1808 08/16/20 0032 08/16/20 0758 08/16/20 1812 08/17/20 0539  HGB 14.5  --   --   --  13.6  --   --  12.7  HCT 42.8  --   --   --  39.8  --   --  38.1  PLT 230  --   --   --  213  --   --  195  APTT  --   --   --  29  --   --   --   --   LABPROT  --   --   --  12.5  --   --   --   --   INR  --   --   --  0.9  --   --   --   --   HEPARINUNFRC  --    < >  --   --  0.63 0.80* 0.39 0.53  CREATININE 1.29*  --   --   --  1.07*  --   --  1.29*  TROPONINIHS 23*  --  26*  --   --   --   --   --    < > = values in this interval not displayed.    Estimated Creatinine Clearance: 57.4 mL/min (A) (by C-G formula based on SCr of 1.29 mg/dL (H)).   Medical History: Past Medical History:  Diagnosis Date  . GERD (gastroesophageal reflux disease)   . Hypertension     Assessment: 45 year old female with PMH of GERD, HTN, tobacco abuse presenting to [facility] ED on 08/15/2020 with 1 week of SOB, dizziness. D-dimer elevated. CTa chest positive for bilateral PE with right heart strain. Pharmacy consulted for IV heparin dosing. Patient not on anticoagulants PTA. H/H, Pltc, INR, aPTT WNL.   08/17/2020:  Heparin level 0.53 - therapeutic on IV heparin infusion at 1150 units/hr  CBC WNL  Pt is on menstrual cycle, but RN reports no unexpected bleeding   No infusion related issues reported by RN  Goal of Therapy:  Heparin level 0.3-0.7 units/ml Monitor platelets by anticoagulation protocol: Yes   Plan:   Continue heparin infusion at 1150  units/hr  Daily CBC, heparin level   Monitor closely for s/sx of bleeding  Napoleon Form  08/17/2020 7:19 AM

## 2020-08-17 NOTE — Progress Notes (Signed)
Patient ID: Jessica Rodgers, female   DOB: 02-May-1975, 45 y.o.   MRN: [id number]  PROGRESS NOTE    Javaria Knapke  KDX:[id number] DOB: [birth year] DOA: 08/15/2020 PCP: Glendale Chard, MD   Brief Narrative:  45 year old female with history of hypertension, GERD, tobacco use and obesity presented with increasing shortness of breath.  On presentation, CTA chest showed extensive bilateral pulmonary embolism with associated right heart strain.  Assessment & Plan:   Acute bilateral pulmonary embolism with right heart strain Tobacco use -Probably secondary to tobacco use, sedentary job and phentermine topiramate use. -Currently on heparin drip.  Continue heparin drip for today.  Probably switch to oral Eliquis or Xarelto in 1 to 2 days.  TOC consult to see if she needs help regarding Eliquis or Xarelto -lower extremity duplex ultrasound showed left popliteal and peroneal veins age-indeterminate DVT -2D echo showed findings consistent with right ventricular pressure and volume overload with severely reduced right ventricular systolic function, severely elevated pulmonary artery systolic pressure and right ventricular strain.  Spoke to Dr. Minna Merritts who recommended to continue heparin drip at least for today with bedrest for today and pulmonary will see the patient in consultation tomorrow.  Possibly switch to oral anticoagulation tomorrow.  Acute kidney injury -Creatinine 1.29 on presentation.  Off IV fluids.  Creatinine 1.29 again today.  Continue to encourage oral intake.  Hypertension -Blood pressure on the lower side.  Home regimen on hold.  Obesity --hold phentermine-topirmate in light of PE-need to consider alternative weight loss method  DVT prophylaxis: Heparin drip Code Status: Full Family Communication: None at bedside Disposition Plan: Status is: Inpatient because: Inpatient level of care appropriate due to severity of illness  Dispo: The patient is from: Home               Anticipated d/c is to: Home              Patient currently is not medically stable to d/c.   Difficult to place patient No   Consultants: Spoke to Dr. Minna Merritts on phone on 6  Procedures: Echo as below  Antimicrobials: None   Subjective: Patient seen and examined at bedside.  Denies worsening shortness of breath or chest pain.  No fever, abdominal pain, bleeding reported.  Objective: Vitals:   08/16/20 1430 08/16/20 2007 08/16/20 2012 08/17/20 0516  BP: 96/69 109/81  92/69  Pulse: 88 79 80 78  Resp: 20 18  19   Temp: (!) 97.3 F (36.3 C) 98.4 F (36.9 C)  97.6 F (36.4 C)  TempSrc: Oral Oral  Oral  SpO2: 97% 100%  97%  Weight:      Height:       No intake or output data in the 24 hours ending 08/17/20 0841 Filed Weights   08/15/20 1053 08/15/20 2127  Weight: 89.8 kg 90.2 kg    Examination:  General exam: No distress.  On room air currently. Respiratory system: Decreased breath sounds at bases bilaterally Cardiovascular system: Rate controlled, S1-S2 heard Gastrointestinal system: Abdomen is slightly distended, soft and nontender.  Bowel sounds are heard  extremities: Trace lower extremity edema present; no clubbing Central nervous system: Awake and alert.  No focal neurological deficits.  Moves extremities  skin: No obvious ecchymosis/lesions  psychiatry: Mood, affect and judgment are normal    Data Reviewed: I have personally reviewed following labs and imaging studies  CBC: Recent Labs  Lab 08/15/20 1056 08/16/20 0032 08/17/20 0539  WBC 9.2 9.2 6.2  HGB 14.5 13.6  12.7  HCT 42.8 39.8 38.1  MCV 103.9* 103.1* 106.7*  PLT 230 213 676   Basic Metabolic Panel: Recent Labs  Lab 08/15/20 1056 08/16/20 0032 08/17/20 0539  NA 138 136 139  K 3.8 3.5 3.8  CL 108 106 108  CO2 23 22 24   GLUCOSE 109* 162* 119*  BUN 19 17 16   CREATININE 1.29* 1.07* 1.29*  CALCIUM 9.1 9.0 8.6*  MG  --   --  1.8   GFR: Estimated Creatinine Clearance: 57.4 mL/min  (A) (by C-G formula based on SCr of 1.29 mg/dL (H)). Liver Function Tests: No results for input(s): AST, ALT, ALKPHOS, BILITOT, PROT, ALBUMIN in the last 168 hours. No results for input(s): LIPASE, AMYLASE in the last 168 hours. No results for input(s): AMMONIA in the last 168 hours. Coagulation Profile: Recent Labs  Lab 08/15/20 1808  INR 0.9   Cardiac Enzymes: No results for input(s): CKTOTAL, CKMB, CKMBINDEX, TROPONINI in the last 168 hours. BNP (last 3 results) No results for input(s): PROBNP in the last 8760 hours. HbA1C: No results for input(s): HGBA1C in the last 72 hours. CBG: No results for input(s): GLUCAP in the last 168 hours. Lipid Profile: No results for input(s): CHOL, HDL, LDLCALC, TRIG, CHOLHDL, LDLDIRECT in the last 72 hours. Thyroid Function Tests: No results for input(s): TSH, T4TOTAL, FREET4, T3FREE, THYROIDAB in the last 72 hours. Anemia Panel: No results for input(s): VITAMINB12, FOLATE, FERRITIN, TIBC, IRON, RETICCTPCT in the last 72 hours. Sepsis Labs: No results for input(s): PROCALCITON, LATICACIDVEN in the last 168 hours.  Recent Results (from the past 240 hour(s))  SARS CORONAVIRUS 2 (TAT 6-24 HRS) Nasopharyngeal Nasopharyngeal Swab     Status: None   Collection Time: 08/15/20 12:26 PM   Specimen: Nasopharyngeal Swab  Result Value Ref Range Status   SARS Coronavirus 2 NEGATIVE NEGATIVE Final    Comment: (NOTE) SARS-CoV-2 target nucleic acids are NOT DETECTED.  The SARS-CoV-2 RNA is generally detectable in upper and lower respiratory specimens during the acute phase of infection. Negative results do not preclude SARS-CoV-2 infection, do not rule out co-infections with other pathogens, and should not be used as the sole basis for treatment or other patient management decisions. Negative results must be combined with clinical observations, patient history, and epidemiological information. The expected result is Negative.  Fact Sheet for  Patients: SugarRoll.be  Fact Sheet for Healthcare Providers: https://www.woods-mathews.com/  This test is not yet approved or cleared by the [place] FDA and  has been authorized for detection and/or diagnosis of SARS-CoV-2 by FDA under an Emergency Use Authorization (EUA). This EUA will remain  in effect (meaning this test can be used) for the duration of the COVID-19 declaration under Se ction 564(b)(1) of the Act, 21 U.S.C. section 360bbb-3(b)(1), unless the authorization is terminated or revoked sooner.  Performed at [facility], [place] [address], [place], [place] [address]          Radiology Studies: CT Angio Chest PE W and/or Wo Contrast  Result Date: 08/15/2020 CLINICAL DATA:  Shortness of breath with dizziness and abdominal discomfort x1 week. EXAM: CT ANGIOGRAPHY CHEST WITH CONTRAST TECHNIQUE: Multidetector CT imaging of the chest was performed using the standard protocol during bolus administration of intravenous contrast. Multiplanar CT image reconstructions and MIPs were obtained to evaluate the vascular anatomy. CONTRAST:  178mL OMNIPAQUE IOHEXOL 350 MG/ML SOLN COMPARISON:  None. FINDINGS: Cardiovascular: An extensive amount of intraluminal low attenuation is seen involving all branches of the bilateral pulmonary  arteries. There is no evidence of saddle embolus. Normal heart size with mild right-sided heart strain. No pericardial effusion. Mediastinum/Nodes: No enlarged mediastinal, hilar, or axillary lymph nodes. Thyroid gland, trachea, and esophagus demonstrate no significant findings. Lungs/Pleura: Lungs are clear. No pleural effusion or pneumothorax. Upper Abdomen: Tiny gallstones are seen within the gallbladder lumen. Musculoskeletal: No chest wall abnormality. No acute or significant osseous findings. Review of the MIP images confirms the above findings. IMPRESSION: 1. Extensive bilateral pulmonary embolism with associated  right heart strain. 2. Cholelithiasis. Electronically Signed   By: Virgina Norfolk M.D.   On: 08/15/2020 17:49   DG Chest Port 1 View  Result Date: 08/15/2020 CLINICAL DATA:  Shortness of breath. 45 year old female with 1 week of dizziness and shortness of breath. EXAM: PORTABLE CHEST 1 VIEW COMPARISON:  November 09, 2016 FINDINGS: Trachea midline. Cardiomediastinal contours and hilar structures are normal. Lungs are clear. Elevated RIGHT hemidiaphragm is similar to the prior study. On limited assessment no acute skeletal process. IMPRESSION: No acute cardiopulmonary disease. Electronically Signed   By: Zetta Bills M.D.   On: 08/15/2020 12:43   ECHOCARDIOGRAM COMPLETE  Result Date: 08/16/2020    ECHOCARDIOGRAM REPORT   Patient Name:   HELAYNA DUN Date of Exam: 08/16/2020 Medical Rec #:  [id number]           Height:       63.0 in Accession #:    [id number]          Weight:       198.9 lb Date of Birth:  1975-06-28           BSA:          1.929 m Patient Age:    18 years            BP:           103/71 mmHg Patient Gender: F                   HR:           81 bpm. Exam Location:  Inpatient Procedure: 2D Echo, Cardiac Doppler and Color Doppler Indications:    I26.02 Pulmonary embolus  History:        Patient has prior history of Echocardiogram examinations, most                 recent 11/10/2016. CT showed mild right heart strain.  Sonographer:    Merrie Roof RDCS Referring Phys: [id number] [place]  1. Left ventricular ejection fraction, by estimation, is 60 to 65%. The left ventricle has normal function. The left ventricle has no regional wall motion abnormalities. There is mild left ventricular hypertrophy. Left ventricular diastolic parameters are indeterminate. There is the interventricular septum is flattened in systole and diastole, consistent with right ventricular pressure and volume overload.  2. Right ventricular systolic function is severely reduced. The right ventricular size is  moderately enlarged. There is severely elevated pulmonary artery systolic pressure. The estimated right ventricular systolic pressure is 01.7 mmHg. McConnell's sign consistent with right ventricular strain from pulmonary embolus.  3. The mitral valve is grossly normal. Trivial mitral valve regurgitation.  4. Tricuspid valve regurgitation is mild to moderate.  5. The aortic valve is tricuspid. Aortic valve regurgitation is not visualized. Mild to moderate aortic valve sclerosis/calcification is present, without any evidence of aortic stenosis. Aortic valve mean gradient measures 3.0 mmHg.  6. The inferior vena cava is normal in size with <50%  respiratory variability, suggesting right atrial pressure of 8 mmHg. FINDINGS  Left Ventricle: Left ventricular ejection fraction, by estimation, is 60 to 65%. The left ventricle has normal function. The left ventricle has no regional wall motion abnormalities. The left ventricular internal cavity size was normal in size. There is  mild left ventricular hypertrophy. The interventricular septum is flattened in systole and diastole, consistent with right ventricular pressure and volume overload. Left ventricular diastolic parameters are indeterminate. Right Ventricle: The right ventricular size is moderately enlarged. No increase in right ventricular wall thickness. Right ventricular systolic function is severely reduced. There is severely elevated pulmonary artery systolic pressure. The tricuspid regurgitant velocity is 4.36 m/s, and with an assumed right atrial pressure of 8 mmHg, the estimated right ventricular systolic pressure is 26.3 mmHg. Left Atrium: Left atrial size was normal in size. Right Atrium: Right atrial size was normal in size. Pericardium: There is no evidence of pericardial effusion. Mitral Valve: The mitral valve is grossly normal. Trivial mitral valve regurgitation. Tricuspid Valve: The tricuspid valve is grossly normal. Tricuspid valve regurgitation is mild  to moderate. Aortic Valve: The aortic valve is tricuspid. There is mild aortic valve annular calcification. Aortic valve regurgitation is not visualized. Mild to moderate aortic valve sclerosis/calcification is present, without any evidence of aortic stenosis. Aortic  valve mean gradient measures 3.0 mmHg. Aortic valve peak gradient measures 6.6 mmHg. Aortic valve area, by VTI measures 2.15 cm. Pulmonic Valve: The pulmonic valve was grossly normal. Pulmonic valve regurgitation is trivial. Aorta: The aortic root is normal in size and structure. Venous: The inferior vena cava is normal in size with less than 50% respiratory variability, suggesting right atrial pressure of 8 mmHg. IAS/Shunts: There is left bowing of the interatrial septum, suggestive of elevated right atrial pressure. No atrial level shunt detected by color flow Doppler.  LEFT VENTRICLE PLAX 2D LVIDd:         3.50 cm  Diastology LVIDs:         2.30 cm  LV e' medial:    7.62 cm/s LV PW:         1.10 cm  LV E/e' medial:  5.3 LV IVS:        1.10 cm  LV e' lateral:   10.20 cm/s LVOT diam:     1.90 cm  LV E/e' lateral: 4.0 LV SV:         39 LV SV Index:   20 LVOT Area:     2.84 cm  RIGHT VENTRICLE            IVC RV Basal diam:  4.10 cm    IVC diam: 2.10 cm RV Mid diam:    4.30 cm RV S prime:     9.90 cm/s TAPSE (M-mode): 1.9 cm LEFT ATRIUM             Index       RIGHT ATRIUM           Index LA diam:        2.60 cm 1.35 cm/m  RA Area:     19.80 cm LA Vol (A2C):   31.2 ml 16.17 ml/m RA Volume:   61.10 ml  31.68 ml/m LA Vol (A4C):   55.5 ml 28.77 ml/m LA Biplane Vol: 41.7 ml 21.62 ml/m  AORTIC VALVE AV Area (Vmax):    2.07 cm AV Area (Vmean):   2.13 cm AV Area (VTI):     2.15 cm AV Vmax:  128.00 cm/s AV Vmean:          85.900 cm/s AV VTI:            0.182 m AV Peak Grad:      6.6 mmHg AV Mean Grad:      3.0 mmHg LVOT Vmax:         93.30 cm/s LVOT Vmean:        64.500 cm/s LVOT VTI:          0.138 m LVOT/AV VTI ratio: 0.76  AORTA Ao Root  diam: 3.00 cm MITRAL VALVE               TRICUSPID VALVE MV Area (PHT): 3.99 cm    TR Peak grad:   76.0 mmHg MV Decel Time: 190 msec    TR Vmax:        436.00 cm/s MV E velocity: 40.30 cm/s MV A velocity: 72.40 cm/s  SHUNTS MV E/A ratio:  0.56        Systemic VTI:  0.14 m                            Systemic Diam: 1.90 cm Rozann Lesches MD Electronically signed by Rozann Lesches MD Signature Date/Time: 08/16/2020/1:05:32 PM    Final    VAS [place] LOWER EXTREMITY VENOUS (DVT)  Result Date: 08/16/2020  Lower Venous DVT Study Patient Name:  LEXY MEININGER  Date of Exam:   08/16/2020 Medical Rec #: [id number]            Accession #:    [id number] Date of Birth: 1976/01/17            Patient Gender: F Patient Age:   83Y Exam Location:  [facility] Procedure:      VAS [place] LOWER EXTREMITY VENOUS (DVT) Referring Phys: [id number] [place] T TU --------------------------------------------------------------------------------  Indications: Pulmonary embolism.  Risk Factors: Confirmed PE. Anticoagulation: Heparin. Limitations: Body habitus and poor ultrasound/tissue interface. Comparison Study: No prior studies. Performing Technologist: Oliver Hum RVT  Examination Guidelines: A complete evaluation includes B-mode imaging, spectral Doppler, color Doppler, and power Doppler as needed of all accessible portions of each vessel. Bilateral testing is considered an integral part of a complete examination. Limited examinations for reoccurring indications may be performed as noted. The reflux portion of the exam is performed with the patient in reverse Trendelenburg.  +---------+---------------+---------+-----------+----------+--------------+ RIGHT    CompressibilityPhasicitySpontaneityPropertiesThrombus Aging +---------+---------------+---------+-----------+----------+--------------+ CFV      Full           Yes      Yes                                  +---------+---------------+---------+-----------+----------+--------------+ SFJ      Full                                                        +---------+---------------+---------+-----------+----------+--------------+ FV Prox  Full                                                        +---------+---------------+---------+-----------+----------+--------------+  FV Mid   Full                                                        +---------+---------------+---------+-----------+----------+--------------+ FV DistalFull                                                        +---------+---------------+---------+-----------+----------+--------------+ PFV      Full                                                        +---------+---------------+---------+-----------+----------+--------------+ POP      Full           Yes      Yes                                 +---------+---------------+---------+-----------+----------+--------------+ PTV      Full                                                        +---------+---------------+---------+-----------+----------+--------------+ PERO     Full                                                        +---------+---------------+---------+-----------+----------+--------------+   +---------+---------------+---------+-----------+----------+-----------------+ LEFT     CompressibilityPhasicitySpontaneityPropertiesThrombus Aging    +---------+---------------+---------+-----------+----------+-----------------+ CFV      Full           Yes      Yes                                    +---------+---------------+---------+-----------+----------+-----------------+ SFJ      Full                                                           +---------+---------------+---------+-----------+----------+-----------------+ FV Prox  Full                                                            +---------+---------------+---------+-----------+----------+-----------------+ FV Mid   Full                                                           +---------+---------------+---------+-----------+----------+-----------------+  FV DistalFull                                                           +---------+---------------+---------+-----------+----------+-----------------+ PFV      Full                                                           +---------+---------------+---------+-----------+----------+-----------------+ POP      None           No       No                   Age Indeterminate +---------+---------------+---------+-----------+----------+-----------------+ PTV      Full                                                           +---------+---------------+---------+-----------+----------+-----------------+ PERO     Partial                                      Age Indeterminate +---------+---------------+---------+-----------+----------+-----------------+    Summary: RIGHT: - There is no evidence of deep vein thrombosis in the lower extremity.  - No cystic structure found in the popliteal fossa.  LEFT: - Findings consistent with age indeterminate deep vein thrombosis involving the left popliteal vein, and left peroneal veins. - No cystic structure found in the popliteal fossa.  *See table(s) above for measurements and observations.    Preliminary         Scheduled Meds: Continuous Infusions: . heparin 1,150 Units/hr (08/16/20 1331)          Aline August, MD [facility] 08/17/2020, 8:41 AM

## 2020-08-17 NOTE — Consult Note (Signed)
NAME:  Jessica Rodgers, MRN:  [id number], DOB:  1975/07/12, LOS: 1 ADMISSION DATE:  08/15/2020, CONSULTATION DATE: 6/5 REFERRING MD:  [facility]  CHIEF COMPLAINT:  sob  History of Present Illness:    45 y.o. female recent smoking cessation  with medical history significant for hypertension, GERD and obesity who presents with concerns of increasing shortness of breath.  For the past week PTA  noticed shortness of breath that progressively got worse.  It is worse with ambulation>  to the point at rest by day of admit.  She denied any chest pain or tightness.  Deniedany lower extremity edema or pain.  No cough.  No fever.  She denied any recent travels but works from home and sits for long hours.  She also has used tobacco for at least 20 years half a pack per day.  She stopped for the past week due to increasing shortness of breath.  She is not on any oral contraceptives or hormone therapy.  No personal history of DVT or PE.  No family history that she is aware of however she does not know her father side of the family. No recent travel or trauma. baseline very sedentary and Not limited by breathing from desired activities    ED Course: She was mildly tachycardic tachypneic and normotensive on room air.  D-dimer greater than 3.  Troponin of 23 and 26.  CBC unremarkable.  Creatinine elevated to 1.29 from a prior of 0.95.  CTA chest shows extensive bilateral pulmonary embolism with associated right heart strain.  Patient was started on IV heparin and hospitalist was called for admission.  Pertinent  Medical History  Obesity GERD HBP  Significant Hospital Events: Including procedures, antibiotic start and stop dates in addition to other pertinent events   . CTa chest 6/3     Bilateral PE with RH strain  . Echo 6/4 with severe RV dysfunction  With  esp PAS  84 but RAP 8  . Venous doppler 6/4 age indeterminate deep vein thrombosis  involving the left popliteal vein, and left peroneal  veins  Interim History / Subjective:  Comfortable at rest RA 45 degrees hob   Objective   Blood pressure 91/74, pulse 65, temperature 98.1 F (36.7 C), temperature source Oral, resp. rate 14, height 5\' 3"  (1.6 m), weight 90.2 kg, last menstrual period 08/15/2020, SpO2 97 %.       No intake or output data in the 24 hours ending 08/17/20 1630 Filed Weights   08/15/20 1053 08/15/20 2127  Weight: 89.8 kg 90.2 kg    Examination: Tmax  98.4  General appearance:    Pleasant bf nad  At Rest 02 sats  99% on RA  No jvd Oropharynx clear,  mucosa nl Neck supple Lungs clear  bilaterally RRRslt increase P 2  Abd soft excursion  Extr warm with no edema or clubbing noted Neuro  Sensorium intact,  no apparent motor deficits     Assessment & Plan:  1)  Massive PE with R heart strain by ct and echo in setting of underlying obesity, very sedentary and probable L DVT ? Longstanding?  >>> rx one more day of IV hep/ slow mobilization and be sure she has DOAC access at discharge    2)  Pulmonary hypertension was likely already elevated as otherwise she would have had much more hemodyncamic effects  - at risk of TEPAH but should do fine with anticoagulation and no need to repeat echo at this point  Will need at least one more day  of IV hep for clot stabilization and very close f/u to be sure she has access to [facility] and gets a f/u echo/venous dopplers  prior to considering modifying doac in any way  With f/u by hematology p at least 6 m rx in this setting.     She has my card for f/u prn but has a good PCP in Dr [facility] and we can see her at her discretion.  Labs   CBC: Recent Labs  Lab 08/15/20 1056 08/16/20 0032 08/17/20 0539  WBC 9.2 9.2 6.2  HGB 14.5 13.6 12.7  HCT 42.8 39.8 38.1  MCV 103.9* 103.1* 106.7*  PLT 230 213 130    Basic Metabolic Panel: Recent Labs  Lab 08/15/20 1056 08/16/20 0032 08/17/20 0539  NA 138 136 139  K 3.8 3.5 3.8  CL 108 106 108  CO2 23 22 24    GLUCOSE 109* 162* 119*  BUN 19 17 16   CREATININE 1.29* 1.07* 1.29*  CALCIUM 9.1 9.0 8.6*  MG  --   --  1.8   GFR: Estimated Creatinine Clearance: 57.4 mL/min (A) (by C-G formula based on SCr of 1.29 mg/dL (H)). Recent Labs  Lab 08/15/20 1056 08/16/20 0032 08/17/20 0539  WBC 9.2 9.2 6.2    Liver Function Tests: No results for input(s): AST, ALT, ALKPHOS, BILITOT, PROT, ALBUMIN in the last 168 hours. No results for input(s): LIPASE, AMYLASE in the last 168 hours. No results for input(s): AMMONIA in the last 168 hours.  ABG    Component Value Date/Time   HCO3 21.2 11/05/2016 0425   ACIDBASEDEF 1.5 11/05/2016 0425   O2SAT 84.0 11/05/2016 0425     Coagulation Profile: Recent Labs  Lab 08/15/20 1808  INR 0.9    Cardiac Enzymes: No results for input(s): CKTOTAL, CKMB, CKMBINDEX, TROPONINI in the last 168 hours.  HbA1C: Hemoglobin A1C  Date/Time Value Ref Range Status  11/01/2017 12:00 AM 5.0  Final   Hgb A1c MFr Bld  Date/Time Value Ref Range Status  01/16/2019 12:43 PM 4.8 4.8 - 5.6 % Final    Comment:             Prediabetes: 5.7 - 6.4          Diabetes: >6.4          Glycemic control for adults with diabetes: <7.0     CBG: No results for input(s): GLUCAP in the last 168 hours.     Past Medical History:  She,  has a past medical history of GERD (gastroesophageal reflux disease) and Hypertension.   Surgical History:   Past Surgical History:  Procedure Laterality Date  . BREAST EXCISIONAL BIOPSY Right 04/07/2017   PASH  . RADIOACTIVE SEED GUIDED EXCISIONAL BREAST BIOPSY Right 04/07/2017   Procedure: RIGHT RADIOACTIVE SEED GUIDED EXCISIONAL BREAST BIOPSY;  Surgeon: Rolm Bookbinder, MD;  Location: [facility];  Service: General;  Laterality: Right;  . TUBAL LIGATION  1997     Social History:   reports that she quit smoking 8 days ago. Her smoking use included cigarettes. She started smoking about 17 years ago. She has a 9.00 pack-year  smoking history. She has never used smokeless tobacco. She reports current alcohol use. She reports that she does not use drugs.   Family History:  Her family history includes Arthritis in her mother; Breast cancer (age of onset: 71) in her maternal grandmother; Glaucoma in her mother; Hypertension in her mother; Other in  her father.   Allergies No Known Allergies   Home Medications  Prior to Admission medications   Medication Sig Start Date End Date Taking? Authorizing Provider  Ascorbic Acid (VITAMIN C) 1000 MG tablet Take 1,000 mg by mouth daily.   Yes [provider]  Cholecalciferol (VITAMIN D PO) Take 1 tablet by mouth daily. 5,000u   Yes [provider]  Cyanocobalamin (VITAMIN B 12 PO) Take 1 tablet by mouth daily.   Yes [provider]  ELDERBERRY PO Take 1 tablet by mouth daily.   Yes [provider]  Phentermine-Topiramate (QSYMIA) 11.25-69 MG CP24 Take 1 tablet by mouth daily.   Yes [provider]  valsartan-hydrochlorothiazide (DIOVAN-HCT) 80-12.5 MG tablet Take 1 tablet by mouth daily. 06/05/20  Yes Glendale Chard, MD         Christinia Gully, MD [facility] [place] [phone]   After 7:00 pm call Elink  [phone]

## 2020-08-18 DIAGNOSIS — I824Y2 Acute embolism and thrombosis of unspecified deep veins of left proximal lower extremity: Secondary | ICD-10-CM

## 2020-08-18 LAB — CBC
HCT: 36.7 % (ref 36.0–46.0)
Hemoglobin: 12.3 g/dL (ref 12.0–15.0)
MCH: 35.7 pg — ABNORMAL HIGH (ref 26.0–34.0)
MCHC: 33.5 g/dL (ref 30.0–36.0)
MCV: 106.4 fL — ABNORMAL HIGH (ref 80.0–100.0)
Platelets: 213 10*3/uL (ref 150–400)
RBC: 3.45 MIL/uL — ABNORMAL LOW (ref 3.87–5.11)
RDW: 11.9 % (ref 11.5–15.5)
WBC: 5.8 10*3/uL (ref 4.0–10.5)
nRBC: 0 % (ref 0.0–0.2)

## 2020-08-18 LAB — BASIC METABOLIC PANEL
Anion gap: 6 (ref 5–15)
BUN: 14 mg/dL (ref 6–20)
CO2: 24 mmol/L (ref 22–32)
Calcium: 8.7 mg/dL — ABNORMAL LOW (ref 8.9–10.3)
Chloride: 109 mmol/L (ref 98–111)
Creatinine, Ser: 1.16 mg/dL — ABNORMAL HIGH (ref 0.44–1.00)
GFR, Estimated: 59 mL/min — ABNORMAL LOW (ref >60)
Glucose, Bld: 114 mg/dL — ABNORMAL HIGH (ref 70–99)
Potassium: 3.7 mmol/L (ref 3.5–5.1)
Sodium: 139 mmol/L (ref 135–145)

## 2020-08-18 LAB — HEPARIN LEVEL (UNFRACTIONATED): Heparin Unfractionated: 0.61 IU/mL (ref 0.30–0.70)

## 2020-08-18 LAB — VITAMIN B12: Vitamin B-12: 891 pg/mL (ref 180–914)

## 2020-08-18 LAB — TSH: TSH: 3.354 u[IU]/mL (ref 0.350–4.500)

## 2020-08-18 LAB — MAGNESIUM: Magnesium: 1.7 mg/dL (ref 1.7–2.4)

## 2020-08-18 LAB — FOLATE: Folate: 7.6 ng/mL (ref >5.9)

## 2020-08-18 MED ORDER — APIXABAN 5 MG PO TABS: 5.0000 mg | ORAL_TABLET | Freq: Two times a day (BID) | ORAL | Status: DC

## 2020-08-18 MED ORDER — APIXABAN 5 MG PO TABS
10.0000 mg | ORAL_TABLET | Freq: Two times a day (BID) | ORAL | Status: DC
  Administered 2020-08-18: 10 mg via ORAL
  Filled 2020-08-18: qty 2

## 2020-08-18 MED ORDER — APIXABAN (ELIQUIS) VTE STARTER PACK (10MG AND 5MG): ORAL_TABLET | ORAL | 0 refills | Status: DC

## 2020-08-18 MED ORDER — RIVAROXABAN (XARELTO) VTE STARTER PACK (15 & 20 MG): ORAL_TABLET | ORAL | 0 refills | Status: DC

## 2020-08-18 NOTE — TOC Benefit Eligibility Note (Signed)
Transition of Care [facility]) Benefit Eligibility Note    Patient Details  Name: Jessica Rodgers MRN: [id number] Date of Birth: May 10, 1975   Medication/Dose: Xarelto 20 mg daily for 30 days  Covered?: Yes  Tier: 3 Drug  Prescription Coverage Preferred Pharmacy: local  Spoke with Person/Company/Phone Number:: Michelle/ [facility] [employer] [phone]  Co-Pay: $60.00  Prior Approval: No  Deductible: Met       Kerin Salen Phone Number: 08/18/2020, 10:36 AM

## 2020-08-18 NOTE — Plan of Care (Signed)
  Problem: Coping: Goal: Level of anxiety will decrease Outcome: Progressing   Problem: Pain Managment: Goal: General experience of comfort will improve Outcome: Progressing   

## 2020-08-18 NOTE — Discharge Summary (Addendum)
Physician Discharge Summary  Bekka Qian DJM:[id number] DOB: 03-01-1976 DOA: 08/15/2020  PCP: Glendale Chard, MD  Admit date: 08/15/2020 Discharge date: 08/18/2020  Admitted From: Home Disposition: Home  Recommendations for Outpatient Follow-up:  1. Follow up with PCP in 1 week  2. Outpatient follow-up with pulmonary/Dr. Melvyn Novas 3. Might benefit from outpatient evaluation and follow-up by hematology  4. Follow up in ED if symptoms worsen or new appear   Home Health: No Equipment/Devices: None  Discharge Condition: Stable CODE STATUS: Full Diet recommendation: Heart healthy  Brief/Interim Summary: 45 year old female with history of hypertension, GERD, tobacco use and obesity presented with increasing shortness of breath.  On presentation, CTA chest showed extensive bilateral pulmonary embolism with associated right heart strain.  She was treated with heparin drip.  She is currently stable on room air.  She will be discharged home today on oral Xarelto.  Discharge Diagnoses:   Acute bilateral pulmonary embolism with right heart strain Tobacco use -Probably secondary to tobacco use, sedentary job and phentermine topiramate use. -Currently on heparin drip.   -lower extremity duplex ultrasound showed left popliteal and peroneal veins age-indeterminate DVT -2D echo showed findings consistent with right ventricular pressure and volume overload with severely reduced right ventricular systolic function, severely elevated pulmonary artery systolic pressure and right ventricular strain.    Evaluation by Dr. Minna Merritts appreciated.  Outpatient follow-up with pulmonary and patient will probably benefit from outpatient evaluation and follow-up by oncology -Respiratory status currently stable.  Will discharge home on oral Xarelto: Duration to be decided by PCP/pulmonary or oncology as an outpatient   acute kidney injury -Creatinine 1.29 on presentation.  Off IV fluids.  Creatinine 1.16 again  today.  Continue to encourage oral intake.  Diuretics remain on hold till reevaluation by PCP.  Hypertension -Blood pressure still on the lower side.  Home regimen on hold.  Obesity --hold phentermine-topirmate in light of PE-need to consider alternative weight loss method -Outpatient follow-up with PCP regarding discussion regarding the same.  Discharge Instructions  Discharge Instructions    Ambulatory referral to Pulmonology   Complete by: As directed    PE   Reason for referral: Other   Diet - low sodium heart healthy   Complete by: As directed    Increase activity slowly   Complete by: As directed      Allergies as of 08/18/2020   No Known Allergies     Medication List    STOP taking these medications   Qsymia 11.25-69 MG Cp24 Generic drug: Phentermine-Topiramate   valsartan-hydrochlorothiazide 80-12.5 MG tablet Commonly known as: DIOVAN-HCT     TAKE these medications   ELDERBERRY PO Take 1 tablet by mouth daily.   Rivaroxaban Stater Pack (15 mg and 20 mg) Commonly known as: XARELTO STARTER PACK Follow package directions: Take one 15mg  tablet by mouth twice a day. On day 22, switch to one 20mg  tablet once a day. Take with food.  Start this evening   VITAMIN B 12 PO Take 1 tablet by mouth daily.   vitamin C 1000 MG tablet Take 1,000 mg by mouth daily.   VITAMIN D PO Take 1 tablet by mouth daily. 5,000u        Follow-up Information    Glendale Chard, MD. Schedule an appointment as soon as possible for a visit in 1 week(s).   Specialty: Internal Medicine Contact information: [address] [place] [address] [phone]  No Known Allergies  Consultations:  Pulmonary   Procedures/Studies: CT Angio Chest PE W and/or Wo Contrast  Result Date: 08/15/2020 CLINICAL DATA:  Shortness of breath with dizziness and abdominal discomfort x1 week. EXAM: CT ANGIOGRAPHY CHEST WITH CONTRAST TECHNIQUE: Multidetector CT  imaging of the chest was performed using the standard protocol during bolus administration of intravenous contrast. Multiplanar CT image reconstructions and MIPs were obtained to evaluate the vascular anatomy. CONTRAST:  159mL OMNIPAQUE IOHEXOL 350 MG/ML SOLN COMPARISON:  None. FINDINGS: Cardiovascular: An extensive amount of intraluminal low attenuation is seen involving all branches of the bilateral pulmonary arteries. There is no evidence of saddle embolus. Normal heart size with mild right-sided heart strain. No pericardial effusion. Mediastinum/Nodes: No enlarged mediastinal, hilar, or axillary lymph nodes. Thyroid gland, trachea, and esophagus demonstrate no significant findings. Lungs/Pleura: Lungs are clear. No pleural effusion or pneumothorax. Upper Abdomen: Tiny gallstones are seen within the gallbladder lumen. Musculoskeletal: No chest wall abnormality. No acute or significant osseous findings. Review of the MIP images confirms the above findings. IMPRESSION: 1. Extensive bilateral pulmonary embolism with associated right heart strain. 2. Cholelithiasis. Electronically Signed   By: Virgina Norfolk M.D.   On: 08/15/2020 17:49   DG Chest Port 1 View  Result Date: 08/15/2020 CLINICAL DATA:  Shortness of breath. 45 year old female with 1 week of dizziness and shortness of breath. EXAM: PORTABLE CHEST 1 VIEW COMPARISON:  November 09, 2016 FINDINGS: Trachea midline. Cardiomediastinal contours and hilar structures are normal. Lungs are clear. Elevated RIGHT hemidiaphragm is similar to the prior study. On limited assessment no acute skeletal process. IMPRESSION: No acute cardiopulmonary disease. Electronically Signed   By: Zetta Bills M.D.   On: 08/15/2020 12:43   ECHOCARDIOGRAM COMPLETE  Result Date: 08/16/2020    ECHOCARDIOGRAM REPORT   Patient Name:   Jessica Rodgers Date of Exam: 08/16/2020 Medical Rec #:  [id number]           Height:       63.0 in Accession #:    [id number]          Weight:        198.9 lb Date of Birth:  May 03, 1975           BSA:          1.929 m Patient Age:    62 years            BP:           103/71 mmHg Patient Gender: F                   HR:           81 bpm. Exam Location:  Inpatient Procedure: 2D Echo, Cardiac Doppler and Color Doppler Indications:    I26.02 Pulmonary embolus  History:        Patient has prior history of Echocardiogram examinations, most                 recent 11/10/2016. CT showed mild right heart strain.  Sonographer:    Merrie Roof RDCS Referring Phys: [id number] [place]  1. Left ventricular ejection fraction, by estimation, is 60 to 65%. The left ventricle has normal function. The left ventricle has no regional wall motion abnormalities. There is mild left ventricular hypertrophy. Left ventricular diastolic parameters are indeterminate. There is the interventricular septum is flattened in systole and diastole, consistent with right ventricular pressure and volume overload.  2. Right ventricular systolic function is severely  reduced. The right ventricular size is moderately enlarged. There is severely elevated pulmonary artery systolic pressure. The estimated right ventricular systolic pressure is 58.8 mmHg. McConnell's sign consistent with right ventricular strain from pulmonary embolus.  3. The mitral valve is grossly normal. Trivial mitral valve regurgitation.  4. Tricuspid valve regurgitation is mild to moderate.  5. The aortic valve is tricuspid. Aortic valve regurgitation is not visualized. Mild to moderate aortic valve sclerosis/calcification is present, without any evidence of aortic stenosis. Aortic valve mean gradient measures 3.0 mmHg.  6. The inferior vena cava is normal in size with <50% respiratory variability, suggesting right atrial pressure of 8 mmHg. FINDINGS  Left Ventricle: Left ventricular ejection fraction, by estimation, is 60 to 65%. The left ventricle has normal function. The left ventricle has no regional wall motion  abnormalities. The left ventricular internal cavity size was normal in size. There is  mild left ventricular hypertrophy. The interventricular septum is flattened in systole and diastole, consistent with right ventricular pressure and volume overload. Left ventricular diastolic parameters are indeterminate. Right Ventricle: The right ventricular size is moderately enlarged. No increase in right ventricular wall thickness. Right ventricular systolic function is severely reduced. There is severely elevated pulmonary artery systolic pressure. The tricuspid regurgitant velocity is 4.36 m/s, and with an assumed right atrial pressure of 8 mmHg, the estimated right ventricular systolic pressure is 50.2 mmHg. Left Atrium: Left atrial size was normal in size. Right Atrium: Right atrial size was normal in size. Pericardium: There is no evidence of pericardial effusion. Mitral Valve: The mitral valve is grossly normal. Trivial mitral valve regurgitation. Tricuspid Valve: The tricuspid valve is grossly normal. Tricuspid valve regurgitation is mild to moderate. Aortic Valve: The aortic valve is tricuspid. There is mild aortic valve annular calcification. Aortic valve regurgitation is not visualized. Mild to moderate aortic valve sclerosis/calcification is present, without any evidence of aortic stenosis. Aortic  valve mean gradient measures 3.0 mmHg. Aortic valve peak gradient measures 6.6 mmHg. Aortic valve area, by VTI measures 2.15 cm. Pulmonic Valve: The pulmonic valve was grossly normal. Pulmonic valve regurgitation is trivial. Aorta: The aortic root is normal in size and structure. Venous: The inferior vena cava is normal in size with less than 50% respiratory variability, suggesting right atrial pressure of 8 mmHg. IAS/Shunts: There is left bowing of the interatrial septum, suggestive of elevated right atrial pressure. No atrial level shunt detected by color flow Doppler.  LEFT VENTRICLE PLAX 2D LVIDd:         3.50 cm   Diastology LVIDs:         2.30 cm  LV e' medial:    7.62 cm/s LV PW:         1.10 cm  LV E/e' medial:  5.3 LV IVS:        1.10 cm  LV e' lateral:   10.20 cm/s LVOT diam:     1.90 cm  LV E/e' lateral: 4.0 LV SV:         39 LV SV Index:   20 LVOT Area:     2.84 cm  RIGHT VENTRICLE            IVC RV Basal diam:  4.10 cm    IVC diam: 2.10 cm RV Mid diam:    4.30 cm RV S prime:     9.90 cm/s TAPSE (M-mode): 1.9 cm LEFT ATRIUM             Index  RIGHT ATRIUM           Index LA diam:        2.60 cm 1.35 cm/m  RA Area:     19.80 cm LA Vol (A2C):   31.2 ml 16.17 ml/m RA Volume:   61.10 ml  31.68 ml/m LA Vol (A4C):   55.5 ml 28.77 ml/m LA Biplane Vol: 41.7 ml 21.62 ml/m  AORTIC VALVE AV Area (Vmax):    2.07 cm AV Area (Vmean):   2.13 cm AV Area (VTI):     2.15 cm AV Vmax:           128.00 cm/s AV Vmean:          85.900 cm/s AV VTI:            0.182 m AV Peak Grad:      6.6 mmHg AV Mean Grad:      3.0 mmHg LVOT Vmax:         93.30 cm/s LVOT Vmean:        64.500 cm/s LVOT VTI:          0.138 m LVOT/AV VTI ratio: 0.76  AORTA Ao Root diam: 3.00 cm MITRAL VALVE               TRICUSPID VALVE MV Area (PHT): 3.99 cm    TR Peak grad:   76.0 mmHg MV Decel Time: 190 msec    TR Vmax:        436.00 cm/s MV E velocity: 40.30 cm/s MV A velocity: 72.40 cm/s  SHUNTS MV E/A ratio:  0.56        Systemic VTI:  0.14 m                            Systemic Diam: 1.90 cm Rozann Lesches MD Electronically signed by Rozann Lesches MD Signature Date/Time: 08/16/2020/1:05:32 PM    Final    VAS [place] LOWER EXTREMITY VENOUS (DVT)  Result Date: 08/17/2020  Lower Venous DVT Study Patient Name:  LAURALI GODDARD  Date of Exam:   08/16/2020 Medical Rec #: [id number]            Accession #:    [id number] Date of Birth: 05-22-1975            Patient Gender: F Patient Age:   73Y Exam Location:  [facility] Procedure:      VAS [place] LOWER EXTREMITY VENOUS (DVT) Referring Phys: [id number] [place] T TU  --------------------------------------------------------------------------------  Indications: Pulmonary embolism.  Risk Factors: Confirmed PE. Anticoagulation: Heparin. Limitations: Body habitus and poor ultrasound/tissue interface. Comparison Study: No prior studies. Performing Technologist: Oliver Hum RVT  Examination Guidelines: A complete evaluation includes B-mode imaging, spectral Doppler, color Doppler, and power Doppler as needed of all accessible portions of each vessel. Bilateral testing is considered an integral part of a complete examination. Limited examinations for reoccurring indications may be performed as noted. The reflux portion of the exam is performed with the patient in reverse Trendelenburg.  +---------+---------------+---------+-----------+----------+--------------+ RIGHT    CompressibilityPhasicitySpontaneityPropertiesThrombus Aging +---------+---------------+---------+-----------+----------+--------------+ CFV      Full           Yes      Yes                                 +---------+---------------+---------+-----------+----------+--------------+ SFJ      Full                                                        +---------+---------------+---------+-----------+----------+--------------+  FV Prox  Full                                                        +---------+---------------+---------+-----------+----------+--------------+ FV Mid   Full                                                        +---------+---------------+---------+-----------+----------+--------------+ FV DistalFull                                                        +---------+---------------+---------+-----------+----------+--------------+ PFV      Full                                                        +---------+---------------+---------+-----------+----------+--------------+ POP      Full           Yes      Yes                                  +---------+---------------+---------+-----------+----------+--------------+ PTV      Full                                                        +---------+---------------+---------+-----------+----------+--------------+ PERO     Full                                                        +---------+---------------+---------+-----------+----------+--------------+   +---------+---------------+---------+-----------+----------+-----------------+ LEFT     CompressibilityPhasicitySpontaneityPropertiesThrombus Aging    +---------+---------------+---------+-----------+----------+-----------------+ CFV      Full           Yes      Yes                                    +---------+---------------+---------+-----------+----------+-----------------+ SFJ      Full                                                           +---------+---------------+---------+-----------+----------+-----------------+ FV Prox  Full                                                           +---------+---------------+---------+-----------+----------+-----------------+  FV Mid   Full                                                           +---------+---------------+---------+-----------+----------+-----------------+ FV DistalFull                                                           +---------+---------------+---------+-----------+----------+-----------------+ PFV      Full                                                           +---------+---------------+---------+-----------+----------+-----------------+ POP      None           No       No                   Age Indeterminate +---------+---------------+---------+-----------+----------+-----------------+ PTV      Full                                                           +---------+---------------+---------+-----------+----------+-----------------+ PERO     Partial                                      Age  Indeterminate +---------+---------------+---------+-----------+----------+-----------------+     Summary: RIGHT: - There is no evidence of deep vein thrombosis in the lower extremity.  - No cystic structure found in the popliteal fossa.  LEFT: - Findings consistent with age indeterminate deep vein thrombosis involving the left popliteal vein, and left peroneal veins. - No cystic structure found in the popliteal fossa.  *See table(s) above for measurements and observations. Electronically signed by Ruta Hinds MD on 08/17/2020 at 5:10:29 PM.    Final        Subjective: Patient seen and examined at bedside.  She feels better.  Her breathing is improving.  She feels ready to go home today.  No overnight fever or vomiting or worsening chest pain reported.  Discharge Exam: Vitals:   08/17/20 2051 08/18/20 0505  BP: 110/75 105/84  Pulse: 80 73  Resp: 18 18  Temp: 98.3 F (36.8 C) 98.1 F (36.7 C)  SpO2: 100% 99%    General: Pt is alert, awake, not in acute distress Cardiovascular: rate controlled, S1/S2 + Respiratory: bilateral decreased breath sounds at bases Abdominal: Soft, NT, ND, bowel sounds + Extremities: Trace lower extremity edema; no cyanosis    The results of significant diagnostics from this hospitalization (including imaging, microbiology, ancillary and laboratory) are listed below for reference.     Microbiology: Recent Results (from the past 240 hour(s))  SARS CORONAVIRUS 2 (TAT 6-24 HRS) Nasopharyngeal Nasopharyngeal Swab     Status: None   Collection  Time: 08/15/20 12:26 PM   Specimen: Nasopharyngeal Swab  Result Value Ref Range Status   SARS Coronavirus 2 NEGATIVE NEGATIVE Final    Comment: (NOTE) SARS-CoV-2 target nucleic acids are NOT DETECTED.  The SARS-CoV-2 RNA is generally detectable in upper and lower respiratory specimens during the acute phase of infection. Negative results do not preclude SARS-CoV-2 infection, do not rule out co-infections with  other pathogens, and should not be used as the sole basis for treatment or other patient management decisions. Negative results must be combined with clinical observations, patient history, and epidemiological information. The expected result is Negative.  Fact Sheet for Patients: SugarRoll.be  Fact Sheet for Healthcare Providers: https://www.woods-mathews.com/  This test is not yet approved or cleared by the [place] FDA and  has been authorized for detection and/or diagnosis of SARS-CoV-2 by FDA under an Emergency Use Authorization (EUA). This EUA will remain  in effect (meaning this test can be used) for the duration of the COVID-19 declaration under Se ction 564(b)(1) of the Act, 21 U.S.C. section 360bbb-3(b)(1), unless the authorization is terminated or revoked sooner.  Performed at [facility], [place] [address], [place], [place] [address]      Labs: BNP (last 3 results) Recent Labs    08/15/20 1110  BNP 287.8*   Basic Metabolic Panel: Recent Labs  Lab 08/15/20 1056 08/16/20 0032 08/17/20 0539 08/18/20 0607  NA 138 136 139 139  K 3.8 3.5 3.8 3.7  CL 108 106 108 109  CO2 23 22 24 24   GLUCOSE 109* 162* 119* 114*  BUN 19 17 16 14   CREATININE 1.29* 1.07* 1.29* 1.16*  CALCIUM 9.1 9.0 8.6* 8.7*  MG  --   --  1.8 1.7   Liver Function Tests: No results for input(s): AST, ALT, ALKPHOS, BILITOT, PROT, ALBUMIN in the last 168 hours. No results for input(s): LIPASE, AMYLASE in the last 168 hours. No results for input(s): AMMONIA in the last 168 hours. CBC: Recent Labs  Lab 08/15/20 1056 08/16/20 0032 08/17/20 0539 08/18/20 0607  WBC 9.2 9.2 6.2 5.8  HGB 14.5 13.6 12.7 12.3  HCT 42.8 39.8 38.1 36.7  MCV 103.9* 103.1* 106.7* 106.4*  PLT 230 213 195 213   Cardiac Enzymes: No results for input(s): CKTOTAL, CKMB, CKMBINDEX, TROPONINI in the last 168 hours. BNP: Invalid input(s): POCBNP CBG: No results for  input(s): GLUCAP in the last 168 hours. D-Dimer Recent Labs    08/15/20 1430  DDIMER 3.07*   Hgb A1c No results for input(s): HGBA1C in the last 72 hours. Lipid Profile No results for input(s): CHOL, HDL, LDLCALC, TRIG, CHOLHDL, LDLDIRECT in the last 72 hours. Thyroid function studies No results for input(s): TSH, T4TOTAL, T3FREE, THYROIDAB in the last 72 hours.  Invalid input(s): FREET3 Anemia work up Recent Labs    08/18/20 0607  VITAMINB12 891  FOLATE 7.6   Urinalysis    Component Value Date/Time   COLORURINE YELLOW 11/05/2016 0101   APPEARANCEUR HAZY (A) 11/05/2016 0101   LABSPEC 1.013 11/05/2016 0101   PHURINE 5.0 11/05/2016 0101   GLUCOSEU NEGATIVE 11/05/2016 0101   HGBUR SMALL (A) 11/05/2016 0101   BILIRUBINUR negative 07/21/2020 1645   KETONESUR 5 (A) 11/05/2016 0101   PROTEINUR Negative 07/21/2020 1645   PROTEINUR 30 (A) 11/05/2016 0101   UROBILINOGEN 1.0 07/21/2020 1645   NITRITE negative 07/21/2020 1645   NITRITE NEGATIVE 11/05/2016 0101   LEUKOCYTESUR Negative 07/21/2020 1645   Sepsis Labs Invalid input(s): PROCALCITONIN,  WBC,  [place] Microbiology Recent Results (from the past 240 hour(s))  SARS CORONAVIRUS 2 (TAT 6-24 HRS) Nasopharyngeal Nasopharyngeal Swab     Status: None   Collection Time: 08/15/20 12:26 PM   Specimen: Nasopharyngeal Swab  Result Value Ref Range Status   SARS Coronavirus 2 NEGATIVE NEGATIVE Final    Comment: (NOTE) SARS-CoV-2 target nucleic acids are NOT DETECTED.  The SARS-CoV-2 RNA is generally detectable in upper and lower respiratory specimens during the acute phase of infection. Negative results do not preclude SARS-CoV-2 infection, do not rule out co-infections with other pathogens, and should not be used as the sole basis for treatment or other patient management decisions. Negative results must be combined with clinical observations, patient history, and epidemiological information. The expected result is  Negative.  Fact Sheet for Patients: SugarRoll.be  Fact Sheet for Healthcare Providers: https://www.woods-mathews.com/  This test is not yet approved or cleared by the [place] FDA and  has been authorized for detection and/or diagnosis of SARS-CoV-2 by FDA under an Emergency Use Authorization (EUA). This EUA will remain  in effect (meaning this test can be used) for the duration of the COVID-19 declaration under Se ction 564(b)(1) of the Act, 21 U.S.C. section 360bbb-3(b)(1), unless the authorization is terminated or revoked sooner.  Performed at [facility], [place] [address], [place], [place] [address]      Time coordinating discharge: 35 minutes  SIGNED:   Aline August, MD  [facility] 08/18/2020, 9:57 AM

## 2020-08-18 NOTE — Discharge Instructions (Signed)
Information on my medicine - ELIQUIS (apixaban)  This medication education was reviewed with me or my healthcare representative as part of my discharge preparation.    Why was Eliquis prescribed for you? Eliquis was prescribed to treat blood clots that may have been found in the veins of your legs (deep vein thrombosis) or in your lungs (pulmonary embolism) and to reduce the risk of them occurring again.  What do You need to know about Eliquis ? The starting dose is 10 mg (two 5 mg tablets) taken TWICE daily for the FIRST SEVEN (7) DAYS, then on 08/25/20  the dose is reduced to ONE 5 mg tablet taken TWICE daily.  Eliquis may be taken with or without food.   Try to take the dose about the same time in the morning and in the evening. If you have difficulty swallowing the tablet whole please discuss with your pharmacist how to take the medication safely.  Take Eliquis exactly as prescribed and DO NOT stop taking Eliquis without talking to the doctor who prescribed the medication.  Stopping may increase your risk of developing a new blood clot.  Refill your prescription before you run out.  After discharge, you should have regular check-up appointments with your healthcare provider that is prescribing your Eliquis.    What do you do if you miss a dose? If a dose of ELIQUIS is not taken at the scheduled time, take it as soon as possible on the same day and twice-daily administration should be resumed. The dose should not be doubled to make up for a missed dose.  Important Safety Information A possible side effect of Eliquis is bleeding. You should call your healthcare provider right away if you experience any of the following: ? Bleeding from an injury or your nose that does not stop. ? Unusual colored urine (red or dark brown) or unusual colored stools (red or black). ? Unusual bruising for unknown reasons. ? A serious fall or if you hit your head (even if there is no bleeding).  Some  medicines may interact with Eliquis and might increase your risk of bleeding or clotting while on Eliquis. To help avoid this, consult your healthcare provider or pharmacist prior to using any new prescription or non-prescription medications, including herbals, vitamins, non-steroidal anti-inflammatory drugs (NSAIDs) and supplements.  This website has more information on Eliquis (apixaban): http://[email or web address].com/eliquis/home

## 2020-08-18 NOTE — Progress Notes (Signed)
ANTICOAGULATION CONSULT NOTE - Follow Up Consult  Pharmacy Consult for heparin--> Eliquis Indication: acute pulmonary embolus  No Known Allergies  Patient Measurements: Height: 5\' 3"  (160 cm) Weight: 90.2 kg (198 lb 13.7 oz) IBW/kg (Calculated) : 52.4 Heparin Dosing Weight: 73 kg  Vital Signs: Temp: 98.1 F (36.7 C) (06/06 0505) Temp Source: Oral (06/06 0505) BP: 105/84 (06/06 0505) Pulse Rate: 73 (06/06 0505)  Labs: Recent Labs    08/15/20 1056 08/15/20 1256 08/15/20 1808 08/16/20 0032 08/16/20 0758 08/16/20 1812 08/17/20 0539 08/18/20 0607  HGB 14.5  --   --  13.6  --   --  12.7 12.3  HCT 42.8  --   --  39.8  --   --  38.1 36.7  PLT 230  --   --  213  --   --  195 213  APTT  --   --  29  --   --   --   --   --   LABPROT  --   --  12.5  --   --   --   --   --   INR  --   --  0.9  --   --   --   --   --   HEPARINUNFRC  --   --   --  0.63   < > 0.39 0.53 0.61  CREATININE 1.29*  --   --  1.07*  --   --  1.29* 1.16*  TROPONINIHS 23* 26*  --   --   --   --   --   --    < > = values in this interval not displayed.    Estimated Creatinine Clearance: 63.9 mL/min (A) (by C-G formula based on SCr of 1.16 mg/dL (H)).  Assessment: Patient is a 45 y.o F presented to the ED on 6/3 with c/o SOB.  Chest CT on 6/3 showed bilateral PE with evidence of right heart strain. LE doppler on 6/4 was "consistent with age indeterminate deep vein thrombosis  involving the left popliteal vein, and left peroneal veins."  She was started on heparin drip on admission for VTE.  Pharmacy has been consulted on 6/6 to transition patient to Eliquis.  Today, 08/18/2020: - heparin level 0.61 - cbc stable - no bleeding documented - scr 1.16 (crcl~64)  Goal of Therapy:  Heparin level 0.3-0.7 units/ml Monitor platelets by anticoagulation protocol: Yes   Plan:  - d/c heparin drip - start eliquis 10mg  bid x7 days, then 5 mg bid - pharmacy will sign off, but will follow patient peripherally along with  you. - Re-consult [place] if need further assistance  Tommie Dejoseph P 08/18/2020,7:59 AM

## 2020-08-18 NOTE — Progress Notes (Signed)
Patient will be discharged on Xarelto.  Informed patient of this, went over the dose regimen for xarelto with her, gave her a printed copy of the xarelto patient info sheet and the 30 day free card. Instructed patient to take the xarelto dose this evening since the Eliquis dose was given this morning. She verbalized understanding.  Dia Sitter, PharmD, BCPS 08/18/2020 11:37 AM

## 2020-08-20 ENCOUNTER — Telehealth: Payer: Self-pay

## 2020-08-20 NOTE — Telephone Encounter (Signed)
Transition Care Management Follow-up Telephone Call Date of discharge and from where: 06/062022 [facility] How have you been since you were released from the hospital? Patient states she's taking it easy, she started back working today because she is working from home. Any questions or concerns? Yes the pt wants to know if she still needed to go see the cardiovascular doctor on the 16th that she was referred to.  Items Reviewed: Did the pt receive and understand the discharge instructions provided? Yes  Medications obtained and verified? Yes  Other? Yes  Any new allergies since your discharge? No  Dietary orders reviewed? None giving Do you have support at home? Yes   Home Care and Equipment/Supplies: Were home health services ordered? No If so, what is the name of the agency?   Has the agency set up a time to come to the patient's home?  Were any new equipment or medical supplies ordered?  No What is the name of the medical supply agency?  Were you able to get the supplies/equipment?  Do you have any questions related to the use of the equipment or supplies? No  Functional Questionnaire: (I = Independent and D = Dependent) ADLs: I  Bathing/Dressing- I  Meal Prep- I  Eating- I  Maintaining continence- I  Transferring/Ambulation- I  Managing Meds- I  Follow up appointments reviewed:  PCP Hospital f/u appt confirmed? Yes  Scheduled to see Dr Baird Cancer on 08/26/2020 @ [facility] f/u appt confirmed? No  patient is waiting on a referral to Dr. Marlene Lard pulmonology. Are transportation arrangements needed? No  If their condition worsens, is the pt aware to call PCP or go to the Emergency Dept.? yes Was the patient provided with contact information for the PCP's office or ED? yes Was to pt encouraged to call back with questions or concerns? yes

## 2020-08-26 ENCOUNTER — Encounter: Payer: Self-pay | Admitting: Internal Medicine

## 2020-08-26 ENCOUNTER — Other Ambulatory Visit: Payer: Self-pay

## 2020-08-26 ENCOUNTER — Ambulatory Visit (INDEPENDENT_AMBULATORY_CARE_PROVIDER_SITE_OTHER): Payer: BC Managed Care – PPO | Admitting: Internal Medicine

## 2020-08-26 ENCOUNTER — Other Ambulatory Visit: Payer: Self-pay | Admitting: Internal Medicine

## 2020-08-26 VITALS — BP 118/78 | HR 82 | Temp 98.9°F | Ht 63.0 in | Wt 205.4 lb

## 2020-08-26 DIAGNOSIS — N179 Acute kidney failure, unspecified: Secondary | ICD-10-CM

## 2020-08-26 DIAGNOSIS — I2699 Other pulmonary embolism without acute cor pulmonale: Secondary | ICD-10-CM | POA: Diagnosis not present

## 2020-08-26 DIAGNOSIS — R0683 Snoring: Secondary | ICD-10-CM | POA: Diagnosis not present

## 2020-08-26 DIAGNOSIS — Z79899 Other long term (current) drug therapy: Secondary | ICD-10-CM

## 2020-08-26 DIAGNOSIS — Z09 Encounter for follow-up examination after completed treatment for conditions other than malignant neoplasm: Secondary | ICD-10-CM

## 2020-08-26 NOTE — Patient Instructions (Signed)
Shortness of Breath, Adult Shortness of breath means you have trouble breathing. Shortness of breath couldbe a sign of a medical problem. Follow these instructions at home:  Watch for any changes in your symptoms. Do not use any products that contain nicotine or tobacco, such as cigarettes, e-cigarettes, and chewing tobacco. Do not smoke. Smoking can cause shortness of breath. If you need help to quit smoking, ask your doctor. Avoid things that can make it harder to breathe, such as: Mold. Dust. Air pollution. Chemical smells. Things that can cause allergy symptoms (allergens), if you have allergies. Keep your living space clean. Use products that help remove mold and dust. Rest as needed. Slowly return to your normal activities. Take over-the-counter and prescription medicines only as told by your doctor. This includes oxygen therapy and inhaled medicines. Keep all follow-up visits as told by your doctor. This is important. Contact a doctor if: Your condition does not get better as soon as expected. You have a hard time doing your normal activities, even after you rest. You have new symptoms. Get help right away if: Your shortness of breath gets worse. You have trouble breathing when you are resting. You feel light-headed or you pass out (faint). You have a cough that is not helped by medicines. You cough up blood. You have pain with breathing. You have pain in your chest, arms, shoulders, or belly (abdomen). You have a fever. You cannot walk up stairs. You cannot exercise the way you normally do. These symptoms may represent a serious problem that is an emergency. Do not wait to see if the symptoms will go away. Get medical help right away. Call your local emergency services (911 in the [place] Do not drive yourself to the hospital. Summary Shortness of breath is when you have trouble breathing enough air. It can be a sign of a medical problem. Avoid things that make it hard for  you to breathe, such as smoking, pollution, mold, and dust. Watch for any changes in your symptoms. Contact your doctor if you do not get better or you get worse. This information is not intended to replace advice given to you by your health care provider. Make sure you discuss any questions you have with your healthcare provider. Document Revised: 08/01/2017 Document Reviewed: 08/01/2017 Elsevier Patient Education  2022 [employer].

## 2020-08-26 NOTE — Progress Notes (Signed)
I,Katawbba Wiggins,acting as a Neurosurgeon for [name], MD.,have documented all relevant documentation on the behalf of [name], MD,as directed by  [name], MD while in the presence of [name], MD.  This visit occurred during the SARS-CoV-2 public health emergency.  Safety protocols were in place, including screening questions prior to the visit, additional usage of staff PPE, and extensive cleaning of exam room while observing appropriate contact time as indicated for disinfecting solutions.  Subjective:     Patient ID: Jessica Rodgers , female    DOB: 1976/01/01 , 45 y.o.   MRN: [id number]   Chief Complaint  Patient presents with   Hospitalization Follow-up    HPI  The patient is here today for a hospital follow-up for shortness of breath. She presented to ER on 6/3 for further evaluation of worsening shortness of breath.  In the ER, cardiac ischemia workup was negative.  CTA chest showed extensive bilateral pulmonary embolism with associated right heart strain.  Her risk factors are thought to be due to tobacco use, [employer] job (more sedentary) and use of phentermine/topiramate for weight loss. She was treated with heparin drip.  She is currently stable on room air.  She was discharged home in stable condition on 08/18/20, she is now taking Xarelto. She has not had any issues with the medication thus far. She denies having any unusual bleeding. She reports her SOB is slowly improving.     Past Medical History:  Diagnosis Date   GERD (gastroesophageal reflux disease)    Hypertension    SOB (shortness of breath)      Family History  Problem Relation Age of Onset   Hypertension Mother    Arthritis Mother    Glaucoma Mother    Other Father        unsure of his health   Breast cancer Maternal Grandmother 35     Current Outpatient Medications:    Ascorbic Acid (VITAMIN C) 1000 MG tablet, Take 1,000 mg by mouth daily., Disp: , Rfl:    Cholecalciferol  (VITAMIN D PO), Take 1 tablet by mouth daily. 5,000u, Disp: , Rfl:    Cyanocobalamin (VITAMIN B 12 PO), Take 1 tablet by mouth daily. (Patient not taking: Reported on 08/20/2020), Disp: , Rfl:    ELDERBERRY PO, Take 1 tablet by mouth daily., Disp: , Rfl:    rivaroxaban (XARELTO) 20 MG TABS tablet, Take 1 tablet (20 mg total) by mouth daily with supper., Disp: 90 tablet, Rfl: 1   No Known Allergies   Review of Systems  Respiratory:  Positive for shortness of breath.   Cardiovascular: Negative.   Gastrointestinal: Negative.   Neurological: Negative.   Psychiatric/Behavioral: Negative.      Today's Vitals   08/26/20 1407  BP: 118/78  Pulse: 82  Temp: 98.9 F (37.2 C)  TempSrc: Oral  SpO2: 99%  Weight: 205 lb 6.4 oz (93.2 kg)  Height: 5\' 3"  (1.6 m)   Body mass index is 36.38 kg/m.  Wt Readings from Last 3 Encounters:  08/26/20 205 lb 6.4 oz (93.2 kg)  08/15/20 198 lb 13.7 oz (90.2 kg)  08/07/20 198 lb (89.8 kg)    BP Readings from Last 3 Encounters:  08/26/20 118/78  08/18/20 105/84  08/15/20 108/78    Objective:  Physical Exam Vitals and nursing note reviewed.  Constitutional:      Appearance: Normal appearance. She is obese.  HENT:     Head: Normocephalic and atraumatic.  Nose:     Comments: Masked     Mouth/Throat:     Comments: Masked  Eyes:     Extraocular Movements: Extraocular movements intact.  Cardiovascular:     Rate and Rhythm: Normal rate and regular rhythm.     Heart sounds: Normal heart sounds.  Pulmonary:     Effort: Pulmonary effort is normal.     Breath sounds: Normal breath sounds.  Musculoskeletal:     Cervical back: Normal range of motion.  Skin:    General: Skin is warm.  Neurological:     General: No focal deficit present.     Mental Status: She is alert.  Psychiatric:        Mood and Affect: Mood normal.        Behavior: Behavior normal.        Assessment And Plan:     1. Acute pulmonary embolism, unspecified pulmonary  embolism type, unspecified whether acute cor pulmonale present (HCC) TCM PERFORMED. A MEMBER OF THE CLINICAL TEAM SPOKE WITH THE PATIENT UPON DISCHARGE. DISCHARGE SUMMARY WAS REVIEWED IN FULL DETAIL DURING THE VISIT. MEDS RECONCILED AND COMPARED TO DISCHARGE MEDS. MEDICATION LIST WAS UPDATED AND REVIEWED WITH THE PATIENT. GREATER THAN 50% FACE TO FACE TIME WAS SPENT IN COUNSELING AND COORDINATION OF CARE. ALL QUESTIONS WERE ANSWERED TO THE SATISFACTION OF THE PATIENT. WE DISCUSSED IMPORTANCE OF MEDICATION COMPLIANCE. SHE SHOULD NOTIFY ME IF SHE HAS ANY UNUSUAL BLEEDING. ENCOURAGED TO KEEP UPCOMING PULMONARY APPT. WILL ALSO REFER HER TO HEMATOLOGY FOR GUIDANCE REGARDING LENGTH OF TREATMENT.   2. Snoring Comments: Will consider sleep study. She was advised this could also contribute to cardiac strain.   3. AKI (acute kidney injury) ([place]) Comments: I will recheck renal function today. Importance of adequate hydration was d/w pt.  - BMP8+EGFR  4. Drug therapy Comments: I wll recheck CBC today.  - CBC no Diff  5. Hospital discharge follow-up   Patient was given opportunity to ask questions. Patient verbalized understanding of the plan and was able to repeat key elements of the plan. All questions were answered to their satisfaction.   I, Maximino Greenland, MD, have reviewed all documentation for this visit. The documentation on 10/05/20 for the exam, diagnosis, procedures, and orders are all accurate and complete.   IF YOU HAVE BEEN REFERRED TO A SPECIALIST, IT MAY TAKE 1-2 WEEKS TO SCHEDULE/PROCESS THE REFERRAL. IF YOU HAVE NOT HEARD FROM US/SPECIALIST IN TWO WEEKS, PLEASE GIVE [place] A CALL AT [phone].   THE PATIENT IS ENCOURAGED TO PRACTICE SOCIAL DISTANCING DUE TO THE COVID-19 PANDEMIC.

## 2020-08-27 LAB — BMP8+EGFR
BUN/Creatinine Ratio: 22 (ref 9–23)
BUN: 23 mg/dL (ref 6–24)
CO2: 21 mmol/L (ref 20–29)
Calcium: 9.8 mg/dL (ref 8.7–10.2)
Chloride: 103 mmol/L (ref 96–106)
Creatinine, Ser: 1.05 mg/dL — ABNORMAL HIGH (ref 0.57–1.00)
Glucose: 114 mg/dL — ABNORMAL HIGH (ref 65–99)
Potassium: 4.1 mmol/L (ref 3.5–5.2)
Sodium: 139 mmol/L (ref 134–144)
eGFR: 66 mL/min/{1.73_m2} (ref >59)

## 2020-08-27 LAB — CBC
Hematocrit: 37 % (ref 34.0–46.6)
Hemoglobin: 13.1 g/dL (ref 11.1–15.9)
MCH: 36.2 pg — ABNORMAL HIGH (ref 26.6–33.0)
MCHC: 35.4 g/dL (ref 31.5–35.7)
MCV: 102 fL — ABNORMAL HIGH (ref 79–97)
Platelets: 287 10*3/uL (ref 150–450)
RBC: 3.62 x10E6/uL — ABNORMAL LOW (ref 3.77–5.28)
RDW: 11 % — ABNORMAL LOW (ref 11.7–15.4)
WBC: 7.9 10*3/uL (ref 3.4–10.8)

## 2020-08-28 ENCOUNTER — Ambulatory Visit (HOSPITAL_COMMUNITY): Payer: BC Managed Care – PPO

## 2020-09-05 ENCOUNTER — Other Ambulatory Visit: Payer: Self-pay

## 2020-09-05 ENCOUNTER — Encounter: Payer: Self-pay | Admitting: Internal Medicine

## 2020-09-05 MED ORDER — RIVAROXABAN 20 MG PO TABS: 20.0000 mg | ORAL_TABLET | Freq: Every day | ORAL | 1 refills | Status: DC

## 2020-09-08 ENCOUNTER — Other Ambulatory Visit: Payer: Self-pay

## 2020-09-09 NOTE — Progress Notes (Deleted)
Cardiology Office Note:    Date:  09/09/2020   ID:  Jessica Rodgers, DOB 10/25/1975, MRN [id number]  PCP:  Glendale Chard, MD   [facility] Providers Cardiologist:  None {   Referring MD: Glendale Chard, MD     History of Present Illness:    Jessica Rodgers is a 45 y.o. female with a hx of HTN, GERD, tobacco use and recent admission for worsening SOB found to have extensive bilateral PE with right heart strain now on xarelto who was referred by Dr. Baird Cancer for shortness of breath and HTN.  Patient was admitted to [facility] from 08/15/20-08/18/20 for shortness of breath found to have extensive bilateral PE with right heart strain on CTA. TTE 2020-08-31 with severely reduced RV systolic function, moderately enlarged RV, severely elevated PASP, mild-to-moderate TR, LVEF 60-65%. LE dopplers with left popliteral and peroneal DVT. She was initially on heparin gtt later transitioned to xarelto. Has follow-up with onc and pulm.  Today,  Past Medical History:  Diagnosis Date   GERD (gastroesophageal reflux disease)    Hypertension     Past Surgical History:  Procedure Laterality Date   BREAST EXCISIONAL BIOPSY Right 04/07/2017   [facility]   RADIOACTIVE SEED GUIDED EXCISIONAL BREAST BIOPSY Right 04/07/2017   Procedure: RIGHT RADIOACTIVE SEED GUIDED EXCISIONAL BREAST BIOPSY;  Surgeon: Rolm Bookbinder, MD;  Location: [facility];  Service: General;  Laterality: Right;   TUBAL LIGATION  1997    Current Medications: No outpatient medications have been marked as taking for the 09/10/20 encounter (Appointment) with Freada Bergeron, MD.     Allergies:   Patient has no known allergies.   Social History   Socioeconomic History   Marital status: Divorced    Spouse name: Not on file   Number of children: Not on file   Years of education: Not on file   Highest education level: Not on file  Occupational History   Not on file  Tobacco Use   Smoking status: Former     Packs/day: 0.50    Years: 18.00    Pack years: 9.00    Types: Cigarettes    Start date: 02/13/2003    Quit date: 08/09/2020    Years since quitting: 0.0   Smokeless tobacco: Never   Tobacco comments:    Try to decrease number of cigs smoked  Vaping Use   Vaping Use: Never used  Substance and Sexual Activity   Alcohol use: Yes   Drug use: No   Sexual activity: Not on file  Other Topics Concern   Not on file  Social History Narrative   Not on file   Social Determinants of Health   Financial Resource Strain: Not on file  Food Insecurity: Not on file  Transportation Needs: Not on file  Physical Activity: Not on file  Stress: Not on file  Social Connections: Not on file     Family History: The patient's ***family history includes Arthritis in her mother; Breast cancer (age of onset: 13) in her maternal grandmother; Glaucoma in her mother; Hypertension in her mother; Other in her father.  ROS:   Please see the history of present illness.    *** All other systems reviewed and are negative.  EKGs/Labs/Other Studies Reviewed:    The following studies were reviewed today: TTE Aug 31, 2020: IMPRESSIONS     1. Left ventricular ejection fraction, by estimation, is 60 to 65%. The  left ventricle has normal function. The left ventricle has no  regional  wall motion abnormalities. There is mild left ventricular hypertrophy.  Left ventricular diastolic parameters  are indeterminate. There is the interventricular septum is flattened in  systole and diastole, consistent with right ventricular pressure and  volume overload.   2. Right ventricular systolic function is severely reduced. The right  ventricular size is moderately enlarged. There is severely elevated  pulmonary artery systolic pressure. The estimated right ventricular  systolic pressure is 57.3 mmHg. McConnell's  sign consistent with right ventricular strain from pulmonary embolus.   3. The mitral valve is grossly normal.  Trivial mitral valve  regurgitation.   4. Tricuspid valve regurgitation is mild to moderate.   5. The aortic valve is tricuspid. Aortic valve regurgitation is not  visualized. Mild to moderate aortic valve sclerosis/calcification is  present, without any evidence of aortic stenosis. Aortic valve mean  gradient measures 3.0 mmHg.   6. The inferior vena cava is normal in size with <50% respiratory  variability, suggesting right atrial pressure of 8 mmHg.   CTA 08/15/20: FINDINGS: Cardiovascular: An extensive amount of intraluminal low attenuation is seen involving all branches of the bilateral pulmonary arteries. There is no evidence of saddle embolus. Normal heart size with mild right-sided heart strain. No pericardial effusion.   Mediastinum/Nodes: No enlarged mediastinal, hilar, or axillary lymph nodes. Thyroid gland, trachea, and esophagus demonstrate no significant findings.   Lungs/Pleura: Lungs are clear. No pleural effusion or pneumothorax.   Upper Abdomen: Tiny gallstones are seen within the gallbladder lumen.   Musculoskeletal: No chest wall abnormality. No acute or significant osseous findings.   Review of the MIP images confirms the above findings.   IMPRESSION: 1. Extensive bilateral pulmonary embolism with associated right heart strain. 2. Cholelithiasis.      EKG:  EKG is *** ordered today.  The ekg ordered today demonstrates ***  Recent Labs: 08/07/2020: ALT 17 08/15/2020: B Natriuretic Peptide 523.5 08/18/2020: Magnesium 1.7; TSH 3.354 08/26/2020: BUN 23; Creatinine, Ser 1.05; Hemoglobin 13.1; Platelets 287; Potassium 4.1; Sodium 139  Recent Lipid Panel    Component Value Date/Time   CHOL 193 08/07/2020 0921   TRIG 67 08/07/2020 0921   HDL 79 08/07/2020 0921   CHOLHDL 2.4 08/07/2020 0921   LDLCALC 102 (H) 08/07/2020 0921     Risk Assessment/Calculations:   {Does this patient have ATRIAL FIBRILLATION?:[phone]}       Physical Exam:    VS:  LMP  08/15/2020     Wt Readings from Last 3 Encounters:  08/26/20 205 lb 6.4 oz (93.2 kg)  08/15/20 198 lb 13.7 oz (90.2 kg)  08/07/20 198 lb (89.8 kg)     GEN: *** Well nourished, well developed in no acute distress HEENT: Normal NECK: No JVD; No carotid bruits LYMPHATICS: No lymphadenopathy CARDIAC: ***RRR, no murmurs, rubs, gallops RESPIRATORY:  Clear to auscultation without rales, wheezing or rhonchi  ABDOMEN: Soft, non-tender, non-distended MUSCULOSKELETAL:  No edema; No deformity  SKIN: Warm and dry NEUROLOGIC:  Alert and oriented x 3 PSYCHIATRIC:  Normal affect   ASSESSMENT:    No diagnosis found. PLAN:    In order of problems listed above:  #Massive PE: Diagnosed 08/16/20 when patient presented with worsening SOB. CTA and TTE with evidence of right heart strain. Was managed with heparin gtt later transitioned to xarelto. -Continue xarelto -Follow-up with heme as scheduled -Repeat TTE to ensure RV improving    {Are you ordering a CV Procedure (e.g. stress test, cath, DCCV, TEE, etc)?   Press F2        :  [id number]}    Medication Adjustments/Labs and Tests Ordered: Current medicines are reviewed at length with the patient today.  Concerns regarding medicines are outlined above.  No orders of the defined types were placed in this encounter.  No orders of the defined types were placed in this encounter.   There are no Patient Instructions on file for this visit.   Signed, Freada Bergeron, MD  09/09/2020 1:24 PM

## 2020-09-10 ENCOUNTER — Ambulatory Visit (HOSPITAL_BASED_OUTPATIENT_CLINIC_OR_DEPARTMENT_OTHER): Payer: BC Managed Care – PPO | Admitting: Cardiology

## 2020-09-11 ENCOUNTER — Encounter: Payer: Self-pay | Admitting: Internal Medicine

## 2020-10-07 ENCOUNTER — Encounter: Payer: BC Managed Care – PPO | Admitting: Internal Medicine

## 2020-10-07 DIAGNOSIS — I2699 Other pulmonary embolism without acute cor pulmonale: Secondary | ICD-10-CM | POA: Insufficient documentation

## 2020-10-08 ENCOUNTER — Ambulatory Visit (INDEPENDENT_AMBULATORY_CARE_PROVIDER_SITE_OTHER): Payer: BC Managed Care – PPO | Admitting: Internal Medicine

## 2020-10-08 ENCOUNTER — Encounter: Payer: Self-pay | Admitting: Internal Medicine

## 2020-10-08 ENCOUNTER — Other Ambulatory Visit: Payer: Self-pay

## 2020-10-08 DIAGNOSIS — I2694 Multiple subsegmental pulmonary emboli without acute cor pulmonale: Secondary | ICD-10-CM | POA: Diagnosis not present

## 2020-10-08 NOTE — Progress Notes (Signed)
Jessica Rodgers, female    DOB: 14-Apr-1975,   MRN: [id number]   Brief patient profile:  36 yobf quit smoking on admit with PE:   Admit date: 08/15/2020 Discharge date: 08/18/2020   Recommendations for Outpatient Follow-up:  Follow up with PCP in 1 week  Outpatient follow-up with pulmonary/Dr. Melvyn Rodgers Might benefit from outpatient evaluation and follow-up by hematology  Follow up in ED if symptoms worsen or new appear        Brief/Interim Summary: 45 year old female with history of hypertension, GERD, tobacco use and obesity presented with increasing shortness of breath.  On presentation, CTA chest showed extensive bilateral pulmonary embolism with associated right heart strain.  She was treated with heparin drip.  She is currently stable on room air.  She will be discharged home today on oral Xarelto.   Discharge Diagnoses:    Acute bilateral pulmonary embolism with right heart strain Tobacco use -Probably secondary to tobacco use, sedentary job and phentermine topiramate use. -Currently on heparin drip.   -lower extremity duplex ultrasound showed left popliteal and peroneal veins age-indeterminate DVT -2D echo showed findings consistent with right ventricular pressure and volume overload with severely reduced right ventricular systolic function, severely elevated pulmonary artery systolic pressure and right ventricular strain.    Evaluation by Jessica Rodgers appreciated.  Outpatient follow-up with pulmonary and patient will probably benefit from outpatient evaluation and follow-up by oncology -Respiratory status currently stable.  Will discharge home on oral Xarelto: Duration to be decided by PCP/pulmonary or oncology as an outpatient   acute kidney injury -Creatinine 1.29 on presentation.  Off IV fluids.  Creatinine 1.16 again today.  Continue to encourage oral intake.  Diuretics remain on hold till reevaluation by PCP.   Hypertension -Blood pressure still on the lower side.   Home regimen on hold.   Obesity --hold phentermine-topirmate in light of PE-need to consider alternative weight loss method -Outpatient follow-up with PCP regarding discussion regarding the same.       History of Present Illness  10/08/2020  Pulmonary/ 1st Rodgers eval/ Jessica Rodgers / Jessica Rodgers  Chief Complaint  Patient presents with   Pulmonary Consult     Referred by Jessica Rodgers- pt dx with PE recently, currently on xarelto. Breathing has improved some still not back her her normal baseline.   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   Sleeping  without nocturnal  or early am exacerbation  of respiratory  c/o's or need for noct saba. Also denies any obvious fluctuation of symptoms with weather or environmental changes or other aggravating or alleviating factors except as outlined above   No unusual exposure hx or h/o childhood pna/ asthma or knowledge of premature birth.  Current Allergies, Complete Past Medical History, Past Surgical History, Family History, and Social History were reviewed in [employer] record.  ROS  The following are not active complaints unless bolded Hoarseness, sore throat, dysphagia, dental problems, itching, sneezing,  nasal congestion or discharge of excess mucus or purulent secretions, ear ache,   fever, chills, sweats, unintended wt loss or wt gain, classically pleuritic  or exertional cp,  orthopnea pnd or arm/hand swelling  or leg swelling, presyncope, palpitations, abdominal pain, anorexia, nausea, vomiting, diarrhea  or change in bowel habits or change in bladder habits, change in stools or  change in urine, dysuria, hematuria,  rash, arthralgias, visual complaints, headache, numbness, weakness or ataxia or problems with walking or coordination,  change in mood or  memory.            Past Medical History:  Diagnosis Date   GERD (gastroesophageal reflux disease)    Hypertension    SOB (shortness of breath)     Outpatient Medications Prior to Visit  Medication Sig Dispense Refill   AMLODIPINE-VALSARTAN-HCTZ PO Take 1 tablet by mouth daily. Unsure of dose     Ascorbic Acid (VITAMIN C) 1000 MG tablet Take 1,000 mg by mouth daily.     Cholecalciferol (VITAMIN D PO) Take 1 tablet by mouth daily. 5,000u     ELDERBERRY PO Take 1 tablet by mouth daily.     rivaroxaban (XARELTO) 20 MG TABS tablet Take 1 tablet (20 mg total) by mouth daily with supper. 90 tablet 1   Cyanocobalamin (VITAMIN B 12 PO) Take 1 tablet by mouth daily.     No facility-administered medications prior to visit.     Objective:     BP 120/82 (BP Location: Left Arm, Cuff Size: Normal)   Pulse 74   Temp 98.2 F (36.8 C) (Oral)   Ht '5\' 3"'$  (1.6 m)   Wt 204 lb 9.6 oz (92.8 kg)   SpO2 99% Comment: on RA  BMI 36.24 kg/m   SpO2: 99 % (on RA)  Amb bf nad     HEENT : pt wearing mask not removed for exam due to covid -19 concerns.    NECK :  without JVD/Nodes/TM/ nl carotid upstrokes bilaterally   LUNGS: no acc muscle use,  Nl contour chest which is clear to A and P bilaterally without cough on insp or exp maneuvers   CV:  RRR  no s3 or murmur or increase in P2, and no edema   ABD:  soft and nontender with nl inspiratory excursion in the supine position. No bruits or organomegaly appreciated, bowel sounds nl  MS:  Nl gait/ ext warm without deformities, calf tenderness, cyanosis or clubbing No obvious joint restrictions   SKIN: warm and dry without lesions    NEURO:  alert, approp, nl sensorium with  no motor or cerebellar deficits apparent.             Assessment   Pulmonary  embolism ([place]) CTa chest 08/15/20     Bilateral PE with RH strain  Echo 6/4 with severe RV dysfunction  With  esp PAS  84 but RAP 8 Venous doppler 6/4 age indeterminate deep vein thrombosis  involving the left popliteal vein, and left peroneal veins  Already improving nearly back to baseline though note now on bcp's per GYN due to heavy periods on xarelto so rec:  >>>   6 months rx with 20 mg daily then recheck echo/ venous dopplers and if they are back to nl then have hematology eval before deciding continued high dose or intermediate dose or stop both BCP's and xarelto though I favor the intermediate dose over any of the other options if hematology agrees.  Discussed in detail all the  indications, usual  risks and alternatives  relative to the benefits with patient who agrees to proceed with Rx as outlined.  Each maintenance medication was reviewed in detail including emphasizing most importantly the difference between maintenance and prns and under what circumstances the prns are to be triggered using an action plan format where appropriate.  Total time for H and P, chart review, counseling,  and generating customized AVS unique to this post hosp initial  Rodgers visit / same day charting > 30 min           Jessica Gully, MD 10/08/2020

## 2020-10-08 NOTE — Assessment & Plan Note (Addendum)
.   CTa chest 08/15/20     Bilateral PE with RH strain  . Echo 6/4 with severe RV dysfunction  With  esp PAS  84 but RAP 8 . Venous doppler 6/4 age indeterminate deep vein thrombosis  involving the left popliteal vein, and left peroneal veins  Already improving nearly back to baseline though note now on bcp's per GYN due to heavy periods on xarelto so rec:  >>>   6 months rx with 20 mg daily then recheck echo/ venous dopplers and if they are back to nl then have hematology eval before deciding continued high dose or intermediate dose or stop both BCP's and xarelto though I favor the intermediate dose over any of the other options if hematology agrees.  Discussed in detail all the  indications, usual  risks and alternatives  relative to the benefits with patient who agrees to proceed with Rx as outlined.              Each maintenance medication was reviewed in detail including emphasizing most importantly the difference between maintenance and prns and under what circumstances the prns are to be triggered using an action plan format where appropriate.  Total time for H and P, chart review, counseling,  and generating customized AVS unique to this post hosp initial  office visit / same day charting > 30 min

## 2020-10-08 NOTE — Patient Instructions (Signed)
No change in your xarelto  To get the most out of exercise, you need to be continuously aware that you are short of breath, but never out of breath, for at least 30 minutes daily.     We will call to set you up for your follow up Echo and venous dopplers (same place, same time) then follow up with me a week later.

## 2020-10-10 ENCOUNTER — Telehealth: Payer: Self-pay | Admitting: Physician Assistant

## 2020-10-10 NOTE — Telephone Encounter (Signed)
Received a new hem referral from Dr. Baird Cancer for Acute pulmonary embolism. Jessica Rodgers has been cld and scheduled to see Murray Hodgkins on 8/16 at 9am. Pt aware to arrive 15 minutes early.

## 2020-10-13 ENCOUNTER — Ambulatory Visit (INDEPENDENT_AMBULATORY_CARE_PROVIDER_SITE_OTHER): Payer: BC Managed Care – PPO | Admitting: Internal Medicine

## 2020-10-13 ENCOUNTER — Other Ambulatory Visit: Payer: Self-pay

## 2020-10-13 ENCOUNTER — Encounter: Payer: Self-pay | Admitting: Internal Medicine

## 2020-10-13 VITALS — BP 130/72 | HR 79 | Temp 98.6°F | Ht 66.0 in | Wt 204.0 lb

## 2020-10-13 DIAGNOSIS — I10 Essential (primary) hypertension: Secondary | ICD-10-CM

## 2020-10-13 DIAGNOSIS — Z0000 Encounter for general adult medical examination without abnormal findings: Secondary | ICD-10-CM

## 2020-10-13 DIAGNOSIS — Z6836 Body mass index (BMI) 36.0-36.9, adult: Secondary | ICD-10-CM | POA: Diagnosis not present

## 2020-10-13 DIAGNOSIS — N920 Excessive and frequent menstruation with regular cycle: Secondary | ICD-10-CM | POA: Diagnosis not present

## 2020-10-13 LAB — POCT URINALYSIS DIPSTICK
Bilirubin, UA: NEGATIVE
Glucose, UA: NEGATIVE
Ketones, UA: NEGATIVE
Leukocytes, UA: NEGATIVE
Nitrite, UA: NEGATIVE
Protein, UA: NEGATIVE
Spec Grav, UA: 1.025 (ref 1.010–1.025)
Urobilinogen, UA: 0.2 E.U./dL
pH, UA: 5.5 (ref 5.0–8.0)

## 2020-10-13 LAB — POCT UA - MICROALBUMIN
Albumin/Creatinine Ratio, Urine, POC: <30
Creatinine, POC: 300 mg/dL
Microalbumin Ur, POC: 10 mg/L

## 2020-10-13 NOTE — Patient Instructions (Addendum)
[id number] - weight loss medication  Health Maintenance, Female Adopting a healthy lifestyle and getting preventive care are important in promoting health and wellness. Ask your health care provider about: The right schedule for you to have regular tests and exams. Things you can do on your own to prevent diseases and keep yourself healthy. What should I know about diet, weight, and exercise? Eat a healthy diet  Eat a diet that includes plenty of vegetables, fruits, low-fat dairy products, and lean protein. Do not eat a lot of foods that are high in solid fats, added sugars, or sodium.  Maintain a healthy weight Body mass index (BMI) is used to identify weight problems. It estimates body fat based on height and weight. Your health care provider can help determineyour BMI and help you achieve or maintain a healthy weight. Get regular exercise Get regular exercise. This is one of the most important things you can do for your health. Most adults should: Exercise for at least 150 minutes each week. The exercise should increase your heart rate and make you sweat (moderate-intensity exercise). Do strengthening exercises at least twice a week. This is in addition to the moderate-intensity exercise. Spend less time sitting. Even light physical activity can be beneficial. Watch cholesterol and blood lipids Have your blood tested for lipids and cholesterol at 47 years of age, then havethis test every 5 years. Have your cholesterol levels checked more often if: Your lipid or cholesterol levels are high. You are older than 47 years of age. You are at high risk for heart disease. What should I know about cancer screening? Depending on your health history and family history, you may need to have cancer screening at various ages. This may include screening for: Breast cancer. Cervical cancer. Colorectal cancer. Skin cancer. Lung cancer. What should I know about heart disease, diabetes, and high blood  pressure? Blood pressure and heart disease High blood pressure causes heart disease and increases the risk of stroke. This is more likely to develop in people who have high blood pressure readings, are of African descent, or are overweight. Have your blood pressure checked: Every 3-5 years if you are 65-32 years of age. Every year if you are 19 years old or older. Diabetes Have regular diabetes screenings. This checks your fasting blood sugar level. Have the screening done: Once every three years after age 77 if you are at a normal weight and have a low risk for diabetes. More often and at a younger age if you are overweight or have a high risk for diabetes. What should I know about preventing infection? Hepatitis B If you have a higher risk for hepatitis B, you should be screened for this virus. Talk with your health care provider to find out if you are at risk forhepatitis B infection. Hepatitis C Testing is recommended for: Everyone born from [birth year] through 1967. Anyone with known risk factors for hepatitis C. Sexually transmitted infections (STIs) Get screened for STIs, including gonorrhea and chlamydia, if: You are sexually active and are younger than 47 years of age. You are older than 47 years of age and your health care provider tells you that you are at risk for this type of infection. Your sexual activity has changed since you were last screened, and you are at increased risk for chlamydia or gonorrhea. Ask your health care provider if you are at risk. Ask your health care provider about whether you are at high risk for HIV. Your health care provider may  recommend a prescription medicine to help prevent HIV infection. If you choose to take medicine to prevent HIV, you should first get tested for HIV. You should then be tested every 3 months for as long as you are taking the medicine. Pregnancy If you are about to stop having your period (premenopausal) and you may become pregnant,  seek counseling before you get pregnant. Take 400 to 800 micrograms (mcg) of folic acid every day if you become pregnant. Ask for birth control (contraception) if you want to prevent pregnancy. Osteoporosis and menopause Osteoporosis is a disease in which the bones lose minerals and strength with aging. This can result in bone fractures. If you are 85 years old or older, or if you are at risk for osteoporosis and fractures, ask your health care provider if you should: Be screened for bone loss. Take a calcium or vitamin D supplement to lower your risk of fractures. Be given hormone replacement therapy (HRT) to treat symptoms of menopause. Follow these instructions at home: Lifestyle Do not use any products that contain nicotine or tobacco, such as cigarettes, e-cigarettes, and chewing tobacco. If you need help quitting, ask your health care provider. Do not use street drugs. Do not share needles. Ask your health care provider for help if you need support or information about quitting drugs. Alcohol use Do not drink alcohol if: Your health care provider tells you not to drink. You are pregnant, may be pregnant, or are planning to become pregnant. If you drink alcohol: Limit how much you use to 0-1 drink a day. Limit intake if you are breastfeeding. Be aware of how much alcohol is in your drink. In the [place] one drink equals one 12 oz bottle of beer (355 mL), one 5 oz glass of wine (148 mL), or one 1 oz glass of hard liquor (44 mL). General instructions Schedule regular health, dental, and eye exams. Stay current with your vaccines. Tell your health care provider if: You often feel depressed. You have ever been abused or do not feel safe at home. Summary Adopting a healthy lifestyle and getting preventive care are important in promoting health and wellness. Follow your health care provider's instructions about healthy diet, exercising, and getting tested or screened for diseases. Follow  your health care provider's instructions on monitoring your cholesterol and blood pressure. This information is not intended to replace advice given to you by your health care provider. Make sure you discuss any questions you have with your healthcare provider. Document Revised: 02/22/2018 Document Reviewed: 02/22/2018 Elsevier Patient Education  2022 [employer].

## 2020-10-13 NOTE — Progress Notes (Signed)
I,Tianna Badgett,acting as a Education administrator for Maximino Greenland, MD.,have documented all relevant documentation on the behalf of Maximino Greenland, MD,as directed by  Maximino Greenland, MD while in the presence of Maximino Greenland, MD.  This visit occurred during the SARS-CoV-2 public health emergency.  Safety protocols were in place, including screening questions prior to the visit, additional usage of staff PPE, and extensive cleaning of exam room while observing appropriate contact time as indicated for disinfecting solutions.  Subjective:     Patient ID: Jessica Rodgers , female    DOB: January 04, 1976 , 45 y.o.   MRN: [id number]   Chief Complaint  Patient presents with   Annual Exam   Hypertension    HPI  She is here today for a full physical examination. She is followed by GYN for her pelvic exams.  She reports her SOB has improved, recently treated for PE in June 2022. She does note that her cycles are significantly heavier while on Xarelto. States she has to change pads about every hour.  Brief recap:CTA chest showed extensive bilateral pulmonary emboli with associated right heart strain. Doppler US revealed age indeterminate DVT involving the left popliteal vein and let peroneal veins.Treated with heparin drip and discharged home on Xarelto.  Had Pulmonary eval on 7/27, I appreciate their input. She has upcoming Hematology evaluation.   Hypertension This is a chronic problem. The current episode started more than 1 year ago. The problem has been gradually improving since onset. The problem is controlled. Pertinent negatives include no blurred vision, chest pain, palpitations or shortness of breath. Risk factors for coronary artery disease include post-menopausal state. The current treatment provides moderate improvement. Compliance problems include exercise.     Past Medical History:  Diagnosis Date   Gallstones    GERD (gastroesophageal reflux disease)    Hypertension    Pulmonary emboli  ([place])      Family History  Problem Relation Age of Onset   Hypertension Mother    Arthritis Mother    Glaucoma Mother    Other Father        unsure of his health   Breast cancer Maternal Grandmother 73     Current Outpatient Medications:    Ascorbic Acid (VITAMIN C) 1000 MG tablet, Take 1,000 mg by mouth daily. As needed, Disp: , Rfl:    Cholecalciferol (VITAMIN D PO), Take 1 tablet by mouth daily. 5,000u, Disp: , Rfl:    ELDERBERRY PO, Take 1 tablet by mouth daily. As needed, Disp: , Rfl:    rivaroxaban (XARELTO) 20 MG TABS tablet, Take 1 tablet (20 mg total) by mouth daily with supper., Disp: 90 tablet, Rfl: 1   valsartan-hydrochlorothiazide (DIOVAN-HCT) 80-12.5 MG tablet, Take 1 tablet by mouth every other day., Disp: , Rfl:    No Known Allergies    The patient states she uses tubal ligation for birth control. Last LMP was Patient's last menstrual period was 10/06/2020.. Negative for Dysmenorrhea. Negative for: breast discharge, breast lump(s), breast pain and breast self exam. Associated symptoms include abnormal vaginal bleeding. Pertinent negatives include abnormal bleeding (hematology), anxiety, decreased libido, depression, difficulty falling sleep, dyspareunia, history of infertility, nocturia, sexual dysfunction, sleep disturbances, urinary incontinence, urinary urgency, vaginal discharge and vaginal itching. Diet regular.    . The patient's tobacco use is:  Social History   Tobacco Use  Smoking Status Former   Packs/day: 0.50   Years: 18.00   Pack years: 9.00   Types: Cigarettes   Start  date: 02/13/2003   Quit date: 08/09/2020   Years since quitting: 0.2  Smokeless Tobacco Never  . She has been exposed to passive smoke. The patient's alcohol use is:  Social History   Substance and Sexual Activity  Alcohol Use Yes   Alcohol/week: 4.0 standard drinks   Types: 3 Glasses of wine, 1 Standard drinks or equivalent per week    Review of Systems  Constitutional:  Negative.   HENT: Negative.    Eyes: Negative.  Negative for blurred vision.  Respiratory: Negative.  Negative for shortness of breath.   Cardiovascular: Negative.  Negative for chest pain and palpitations.  Gastrointestinal: Negative.   Endocrine: Negative.   Genitourinary: Negative.   Musculoskeletal: Negative.   Skin: Negative.   Allergic/Immunologic: Negative.   Neurological: Negative.   Hematological: Negative.   Psychiatric/Behavioral: Negative.      Today's Vitals   10/13/20 0959  BP: 130/72  Pulse: 79  Temp: 98.6 F (37 C)  TempSrc: Oral  Weight: 204 lb (92.5 kg)  Height: '5\' 6"'$  (1.676 m)   Body mass index is 32.93 kg/m.  Wt Readings from Last 3 Encounters:  10/28/20 205 lb 1.6 oz (93 kg)  10/13/20 204 lb (92.5 kg)  10/08/20 204 lb 9.6 oz (92.8 kg)    Objective:  Physical Exam Vitals and nursing note reviewed.  Constitutional:      Appearance: Normal appearance.  HENT:     Head: Normocephalic and atraumatic.     Right Ear: Tympanic membrane, ear canal and external ear normal.     Left Ear: Tympanic membrane, ear canal and external ear normal.     Nose:     Comments: Masked     Mouth/Throat:     Comments: Masked  Eyes:     Extraocular Movements: Extraocular movements intact.     Conjunctiva/sclera: Conjunctivae normal.     Pupils: Pupils are equal, round, and reactive to light.  Cardiovascular:     Rate and Rhythm: Normal rate and regular rhythm.     Pulses: Normal pulses.     Heart sounds: Normal heart sounds.  Pulmonary:     Effort: Pulmonary effort is normal.     Breath sounds: Normal breath sounds.  Chest:  Breasts:    Tanner Score is 5.     Right: Normal.     Left: Normal.     Comments: B/l nipple piercings Abdominal:     General: Bowel sounds are normal.     Palpations: Abdomen is soft.  Genitourinary:    Comments: deferred Musculoskeletal:        General: Normal range of motion.     Cervical back: Normal range of motion and neck  supple.  Skin:    General: Skin is warm and dry.     Comments: Tattoos on b/l LE, anterior chest  Neurological:     General: No focal deficit present.     Mental Status: She is alert and oriented to person, place, and time.  Psychiatric:        Mood and Affect: Mood normal.        Behavior: Behavior normal.        Assessment And Plan:     1. Routine general medical examination at health care facility Comments: A full exam was performed.  Importance of monthly self breast exams was discussed with the patient. PATIENT IS ADVISED TO GET 30-45 MINUTES REGULAR EXERCISE NO LESS THAN FOUR TO FIVE DAYS PER WEEK - BOTH WEIGHTBEARING EXERCISES  AND AEROBIC ARE RECOMMENDED.  PATIENT IS ADVISED TO FOLLOW A HEALTHY DIET WITH AT LEAST SIX FRUITS/VEGGIES PER DAY, DECREASE INTAKE OF RED MEAT, AND TO INCREASE FISH INTAKE TO TWO DAYS PER WEEK.  MEATS/FISH SHOULD NOT BE FRIED, BAKED OR BROILED IS PREFERABLE.  IT IS ALSO IMPORTANT TO CUT BACK ON YOUR SUGAR INTAKE. PLEASE AVOID ANYTHING WITH ADDED SUGAR, CORN SYRUP OR OTHER SWEETENERS. IF YOU MUST USE A SWEETENER, YOU CAN TRY STEVIA. IT IS ALSO IMPORTANT TO AVOID ARTIFICIALLY SWEETENERS AND DIET BEVERAGES. LASTLY, I SUGGEST WEARING SPF 50 SUNSCREEN ON EXPOSED PARTS AND ESPECIALLY WHEN IN THE DIRECT SUNLIGHT FOR AN EXTENDED PERIOD OF TIME.  PLEASE AVOID FAST FOOD RESTAURANTS AND INCREASE YOUR WATER INTAKE.  - Lipid panel  2. Essential hypertension Comments: Chronic, controlled. No EKG performed, previous one reviewed. Encouraged to follow low sodium diet. I will check renal function at next visit. She will f/u in six months.  - POCT Urinalysis Dipstick (81002) - POCT UA - Microalbumin  3. Menorrhagia with regular cycle Comments: Advised to take Xarelto QOD the week of her menses.  I will check iron studies today. She would likely benefit from taking iron during her menses.  - CBC with Diff - Iron, TIBC and Ferritin Panel  4. Class 2 severe obesity due to  excess calories with serious comorbidity and body mass index (BMI) of 36.0 to 36.9 in adult [facility]) She is encouraged to strive for BMI less than 30 to decrease cardiac risk. Advised to aim for at least 150 minutes of exercise per week.  Patient was given opportunity to ask questions. Patient verbalized understanding of the plan and was able to repeat key elements of the plan. All questions were answered to their satisfaction.   I, Maximino Greenland, MD, have reviewed all documentation for this visit. The documentation on 10/13/20 for the exam, diagnosis, procedures, and orders are all accurate and complete.  THE PATIENT IS ENCOURAGED TO PRACTICE SOCIAL DISTANCING DUE TO THE COVID-19 PANDEMIC.

## 2020-10-14 ENCOUNTER — Encounter: Payer: BC Managed Care – PPO | Admitting: Internal Medicine

## 2020-10-14 LAB — CBC WITH DIFFERENTIAL/PLATELET
Basophils Absolute: 0 10*3/uL (ref 0.0–0.2)
Basos: 0 %
EOS (ABSOLUTE): 0.1 10*3/uL (ref 0.0–0.4)
Eos: 1 %
Hematocrit: 35.6 % (ref 34.0–46.6)
Hemoglobin: 11.8 g/dL (ref 11.1–15.9)
Immature Grans (Abs): 0 10*3/uL (ref 0.0–0.1)
Immature Granulocytes: 0 %
Lymphocytes Absolute: 2 10*3/uL (ref 0.7–3.1)
Lymphs: 34 %
MCH: 33.7 pg — ABNORMAL HIGH (ref 26.6–33.0)
MCHC: 33.1 g/dL (ref 31.5–35.7)
MCV: 102 fL — ABNORMAL HIGH (ref 79–97)
Monocytes Absolute: 0.3 10*3/uL (ref 0.1–0.9)
Monocytes: 5 %
Neutrophils Absolute: 3.6 10*3/uL (ref 1.4–7.0)
Neutrophils: 60 %
Platelets: 303 10*3/uL (ref 150–450)
RBC: 3.5 x10E6/uL — ABNORMAL LOW (ref 3.77–5.28)
RDW: 10.6 % — ABNORMAL LOW (ref 11.7–15.4)
WBC: 6 10*3/uL (ref 3.4–10.8)

## 2020-10-14 LAB — IRON,TIBC AND FERRITIN PANEL
Ferritin: 21 ng/mL (ref 15–150)
Iron Saturation: 14 % — ABNORMAL LOW (ref 15–55)
Iron: 52 ug/dL (ref 27–159)
Total Iron Binding Capacity: 372 ug/dL (ref 250–450)
UIBC: 320 ug/dL (ref 131–425)

## 2020-10-14 LAB — LIPID PANEL
Chol/HDL Ratio: 2.1 ratio (ref 0.0–4.4)
Cholesterol, Total: 183 mg/dL (ref 100–199)
HDL: 88 mg/dL (ref >39)
LDL Chol Calc (NIH): 82 mg/dL (ref 0–99)
Triglycerides: 72 mg/dL (ref 0–149)
VLDL Cholesterol Cal: 13 mg/dL (ref 5–40)

## 2020-10-28 ENCOUNTER — Encounter: Payer: Self-pay | Admitting: Physician Assistant

## 2020-10-28 ENCOUNTER — Inpatient Hospital Stay: Payer: BC Managed Care – PPO

## 2020-10-28 ENCOUNTER — Other Ambulatory Visit: Payer: Self-pay

## 2020-10-28 ENCOUNTER — Inpatient Hospital Stay: Payer: BC Managed Care – PPO | Attending: Physician Assistant | Admitting: Physician Assistant

## 2020-10-28 VITALS — BP 127/86 | HR 77 | Temp 98.6°F | Resp 17 | Wt 205.1 lb

## 2020-10-28 DIAGNOSIS — I2782 Chronic pulmonary embolism: Secondary | ICD-10-CM

## 2020-10-28 DIAGNOSIS — Z7901 Long term (current) use of anticoagulants: Secondary | ICD-10-CM | POA: Diagnosis not present

## 2020-10-28 DIAGNOSIS — I82402 Acute embolism and thrombosis of unspecified deep veins of left lower extremity: Secondary | ICD-10-CM

## 2020-10-28 DIAGNOSIS — I82432 Acute embolism and thrombosis of left popliteal vein: Secondary | ICD-10-CM | POA: Insufficient documentation

## 2020-10-28 DIAGNOSIS — I2699 Other pulmonary embolism without acute cor pulmonale: Secondary | ICD-10-CM | POA: Insufficient documentation

## 2020-10-28 LAB — CMP (CANCER CENTER ONLY)
ALT: 14 U/L (ref 0–44)
AST: 21 U/L (ref 15–41)
Albumin: 3.5 g/dL (ref 3.5–5.0)
Alkaline Phosphatase: 43 U/L (ref 38–126)
Anion gap: 7 (ref 5–15)
BUN: 12 mg/dL (ref 6–20)
CO2: 24 mmol/L (ref 22–32)
Calcium: 9.1 mg/dL (ref 8.9–10.3)
Chloride: 106 mmol/L (ref 98–111)
Creatinine: 1.02 mg/dL — ABNORMAL HIGH (ref 0.44–1.00)
GFR, Estimated: >60 mL/min (ref >60)
Glucose, Bld: 105 mg/dL — ABNORMAL HIGH (ref 70–99)
Potassium: 4.4 mmol/L (ref 3.5–5.1)
Sodium: 137 mmol/L (ref 135–145)
Total Bilirubin: 0.4 mg/dL (ref 0.3–1.2)
Total Protein: 7.7 g/dL (ref 6.5–8.1)

## 2020-10-28 LAB — CBC WITH DIFFERENTIAL (CANCER CENTER ONLY)
Abs Immature Granulocytes: 0.01 10*3/uL (ref 0.00–0.07)
Basophils Absolute: 0 10*3/uL (ref 0.0–0.1)
Basophils Relative: 0 %
Eosinophils Absolute: 0.1 10*3/uL (ref 0.0–0.5)
Eosinophils Relative: 1 %
HCT: 36.8 % (ref 36.0–46.0)
Hemoglobin: 12.6 g/dL (ref 12.0–15.0)
Immature Granulocytes: 0 %
Lymphocytes Relative: 32 %
Lymphs Abs: 1.9 10*3/uL (ref 0.7–4.0)
MCH: 34.9 pg — ABNORMAL HIGH (ref 26.0–34.0)
MCHC: 34.2 g/dL (ref 30.0–36.0)
MCV: 101.9 fL — ABNORMAL HIGH (ref 80.0–100.0)
Monocytes Absolute: 0.4 10*3/uL (ref 0.1–1.0)
Monocytes Relative: 7 %
Neutro Abs: 3.5 10*3/uL (ref 1.7–7.7)
Neutrophils Relative %: 60 %
Platelet Count: 283 10*3/uL (ref 150–400)
RBC: 3.61 MIL/uL — ABNORMAL LOW (ref 3.87–5.11)
RDW: 11.4 % — ABNORMAL LOW (ref 11.5–15.5)
WBC Count: 5.9 10*3/uL (ref 4.0–10.5)
nRBC: 0 % (ref 0.0–0.2)

## 2020-10-28 LAB — ANTITHROMBIN III: AntiThromb III Func: 112 % (ref 75–120)

## 2020-10-28 NOTE — Progress Notes (Signed)
[place] Telephone:([phone]) [phone]   Fax:([phone]) [place] NOTE  Patient Care Team: Glendale Chard, MD as PCP - General (Internal Medicine)  Hematological/Oncological History 1) 08/15/2020-08/18/2020: Admitted after presenting with increasing shortness of breath. CTA chest showed extensive bilateral pulmonary emboli with associated right heart strain. Doppler US revealed age indeterminate DVT involving the left popliteal vein and let peroneal veins.Treated with heparin drip and discharged home on Xarelto.   2) 10/28/2020: Establish care with Dede Query PA-C  CHIEF COMPLAINTS/PURPOSE OF CONSULTATION:  "Bilateral Pulmonary Emboli and Left LE DVT"  HISTORY OF PRESENTING ILLNESS:  Jessica Rodgers 45 y.o. female with medical history significant for GERD, hypertension, obesity.   On exam today, Jessica Rodgers reports her energy levels  have improved since hospital discharge.  She is trying to become more active.  She denies any limitations to complete her daily activities on her own.  She has a good appetite without any dietary restrictions.  She denies any nausea, vomiting or abdominal pain.  Her bowel movements are regular without any diarrhea or constipation.  She denies easy bruising or signs of bleeding except for her menstrual cycle.  Since starting on Xarelto, patient has noticed that her menstrual bleeding has increased with heavy bleeding for 2 to 3 days.  She notes that during the heavy days, she needs to change her pad every hour.  Her PCP, Dr. Baird Cancer has advised her to take an oral iron supplement as there is some iron deficiency seen on recent studies.  Her shortness of breath has markedly improved since initiating anticoagulation.  She reports shortness of breath only with heavy exertion.  Patient denies any fevers, chills, night sweats, chest pain or cough.  She has no other complaints.  Rest of the 10 point ROS is below.  MEDICAL HISTORY:  Past Medical  History:  Diagnosis Date   Gallstones    GERD (gastroesophageal reflux disease)    Hypertension    Pulmonary emboli (HCC)     SURGICAL HISTORY: Past Surgical History:  Procedure Laterality Date   BREAST EXCISIONAL BIOPSY Right 04/07/2017   [place]   RADIOACTIVE SEED GUIDED EXCISIONAL BREAST BIOPSY Right 04/07/2017   Procedure: RIGHT RADIOACTIVE SEED GUIDED EXCISIONAL BREAST BIOPSY;  Surgeon: Rolm Bookbinder, MD;  Location: [facility];  Service: General;  Laterality: Right;   TUBAL LIGATION  1997    SOCIAL HISTORY: Social History   Socioeconomic History   Marital status: Divorced    Spouse name: Not on file   Number of children: Not on file   Years of education: Not on file   Highest education level: Not on file  Occupational History   Not on file  Tobacco Use   Smoking status: Former    Packs/day: 0.50    Years: 18.00    Pack years: 9.00    Types: Cigarettes    Start date: 02/13/2003    Quit date: 08/09/2020    Years since quitting: 0.2   Smokeless tobacco: Never  Vaping Use   Vaping Use: Never used  Substance and Sexual Activity   Alcohol use: Yes    Alcohol/week: 4.0 standard drinks    Types: 3 Glasses of wine, 1 Standard drinks or equivalent per week   Drug use: No   Sexual activity: Not on file  Other Topics Concern   Not on file  Social History Narrative   Not on file   Social Determinants of Health   Financial Resource Strain: Not on  file  Food Insecurity: Not on file  Transportation Needs: Not on file  Physical Activity: Not on file  Stress: Not on file  Social Connections: Not on file  Intimate Partner Violence: Not on file    FAMILY HISTORY: Family History  Problem Relation Age of Onset   Hypertension Mother    Arthritis Mother    Glaucoma Mother    Other Father        unsure of his health   Breast cancer Maternal Grandmother 29    ALLERGIES:  has No Known Allergies.  MEDICATIONS:  Current Outpatient Medications   Medication Sig Dispense Refill   Ascorbic Acid (VITAMIN C) 1000 MG tablet Take 1,000 mg by mouth daily. As needed     Cholecalciferol (VITAMIN D PO) Take 1 tablet by mouth daily. 5,000u     ELDERBERRY PO Take 1 tablet by mouth daily. As needed     rivaroxaban (XARELTO) 20 MG TABS tablet Take 1 tablet (20 mg total) by mouth daily with supper. 90 tablet 1   valsartan-hydrochlorothiazide (DIOVAN-HCT) 80-12.5 MG tablet Take 1 tablet by mouth every other day.     No current facility-administered medications for this visit.    REVIEW OF SYSTEMS:   Constitutional: ( - ) fevers, ( - )  chills , ( - ) night sweats Eyes: ( - ) blurriness of vision, ( - ) double vision, ( - ) watery eyes Ears, nose, mouth, throat, and face: ( - ) mucositis, ( - ) sore throat Respiratory: ( - ) cough, ( - ) dyspnea, ( - ) wheezes Cardiovascular: ( - ) palpitation, ( - ) chest discomfort, ( - ) lower extremity swelling Gastrointestinal:  ( - ) nausea, ( - ) heartburn, ( - ) change in bowel habits Skin: ( - ) abnormal skin rashes Lymphatics: ( - ) new lymphadenopathy, ( - ) easy bruising Neurological: ( - ) numbness, ( - ) tingling, ( - ) new weaknesses Behavioral/Psych: ( - ) mood change, ( - ) new changes  All other systems were reviewed with the patient and are negative.  PHYSICAL EXAMINATION: ECOG PERFORMANCE STATUS: 0 - Asymptomatic  Vitals:   10/28/20 0904  BP: 127/86  Pulse: 77  Resp: 17  Temp: 98.6 F (37 C)  SpO2: 100%   Filed Weights   10/28/20 0904  Weight: 205 lb 1.6 oz (93 kg)    GENERAL: well appearing female in NAD  SKIN: skin color, texture, turgor are normal, no rashes or significant lesions EYES: conjunctiva are pink and non-injected, sclera clear OROPHARYNX: no exudate, no erythema; lips, buccal mucosa, and tongue normal  NECK: supple, non-tender LYMPH:  no palpable lymphadenopathy in the cervical or supraclavicular lymph nodes.  LUNGS: clear to auscultation and percussion with  normal breathing effort HEART: regular rate & rhythm and no murmurs and no lower extremity edema ABDOMEN: soft, non-tender, non-distended, normal bowel sounds Musculoskeletal: no cyanosis of digits and no clubbing  PSYCH: alert & oriented x 3, fluent speech NEURO: no focal motor/sensory deficits  LABORATORY DATA:  I have reviewed the data as listed CBC Latest Ref Rng & Units 10/28/2020 10/13/2020 08/26/2020  WBC 4.0 - 10.5 K/uL 5.9 6.0 7.9  Hemoglobin 12.0 - 15.0 g/dL 12.6 11.8 13.1  Hematocrit 36.0 - 46.0 % 36.8 35.6 37.0  Platelets 150 - 400 K/uL 283 303 287    CMP Latest Ref Rng & Units 10/28/2020 08/26/2020 08/18/2020  Glucose 70 - 99 mg/dL 105(H) 114(H) 114(H)  BUN  6 - 20 mg/dL '12 23 14  '$ Creatinine 0.44 - 1.00 mg/dL 1.02(H) 1.05(H) 1.16(H)  Sodium 135 - 145 mmol/L 137 139 139  Potassium 3.5 - 5.1 mmol/L 4.4 4.1 3.7  Chloride 98 - 111 mmol/L 106 103 109  CO2 22 - 32 mmol/L '24 21 24  '$ Calcium 8.9 - 10.3 mg/dL 9.1 9.8 8.7(L)  Total Protein 6.5 - 8.1 g/dL 7.7 - -  Total Bilirubin 0.3 - 1.2 mg/dL 0.4 - -  Alkaline Phos 38 - 126 U/L 43 - -  AST 15 - 41 U/L 21 - -  ALT 0 - 44 U/L 14 - -   RADIOGRAPHIC STUDIES: I have personally reviewed the radiological images as listed and agreed with the findings in the report.  08/15/2020: CTA Chest: 1. Extensive bilateral pulmonary embolism with associated right heart strain. 2. Cholelithiasis.  08/16/2020: [place] Bilateral LE: Findings consistent with age indeterminate deep vein thrombosis  involving the left popliteal vein, and left peroneal veins.   ASSESSMENT & PLAN Jessica Rodgers is a 45 y.o. female who presents to the clinic for evaluation for bilateral extensive pulmonary emboli and left lower extremity DVT that was diagnosed in June 2022.  Patient was treated with IV heparin and then discharged with Xarelto.  I reviewed provoking causes for venous thromboembolisms including prolonged travel, surgery, sedentary lifestyle, hormone  therapy, smoking, trauma and medications. Patient was a former smoker right up until her hospitalization in June 2022. Additionally, she work from a computer but notes that she gets up often to stretch. Patient was taking weight loss medication, phentermine-topirmate for three years,  but there is no documented risk of VTEs based on package insert and literature search. I recommend for patient to proceed with hypercoagulable workup to check CBC, CMP, Protein C and S activity, antiphospholipid antibody panel, prothrombin gene, factor V leidin gene and antithrombin gene. Recommend to continue Xarelto x 6 months.   #Extensive bilateral pulmonary emboli and LLE DVT: -Diagnosed in June 2022 after presented with progressive SOB.  -CTA chest from 08/15/2020 showed extensive bilateral pulmonary embolism with associated right heart strain. -Doppler US on 08/16/2020 showed DVT involving the left popliteal vein and left peroneal veins.  -Currently on Xarelto 20 mg once daily with good tolerance.  -Likely provoking factor is smoking although patient stopped shortly before her hospitalization. She notes smoking occassionally.   -Proceed with hypercoagulable workup to rule other causes.  -RTC around December 2022 with repeat labs.   Orders Placed This Encounter  Procedures   CBC with Differential ([facility] Only)    Standing Status:   Future    Number of Occurrences:   1    Standing Expiration Date:   10/28/2021   CMP ([facility] only)    Standing Status:   Future    Number of Occurrences:   1    Standing Expiration Date:   10/28/2021   Antithrombin III*    Standing Status:   Future    Number of Occurrences:   1    Standing Expiration Date:   10/28/2021   Cardiolipin antibodies, IgG, IgM, IgA*    Standing Status:   Future    Number of Occurrences:   1    Standing Expiration Date:   10/28/2021   Beta-2-glycoprotein i abs, IgG/M/A    Standing Status:   Future    Number of Occurrences:   1    Standing  Expiration Date:   10/28/2021   Lupus anticoagulant panel*  Standing Status:   Future    Number of Occurrences:   1    Standing Expiration Date:   10/28/2021   Prothrombin gene mutation*    Standing Status:   Future    Number of Occurrences:   1    Standing Expiration Date:   10/28/2021   Protein S, total    Standing Status:   Future    Number of Occurrences:   1    Standing Expiration Date:   10/28/2021   Protein S activity*    Standing Status:   Future    Number of Occurrences:   1    Standing Expiration Date:   10/28/2021   Protein C, total*    Standing Status:   Future    Number of Occurrences:   1    Standing Expiration Date:   10/28/2021   Protein C activity*    Standing Status:   Future    Number of Occurrences:   1    Standing Expiration Date:   10/28/2021   Factor 5 Leiden*    Standing Status:   Future    Number of Occurrences:   1    Standing Expiration Date:   10/28/2021    All questions were answered. The patient knows to call the clinic with any problems, questions or concerns.  I have spent a total of 60 minutes minutes of face-to-face and non-face-to-face time, preparing to see the patient, obtaining and/or reviewing separately obtained history, performing a medically appropriate examination, counseling and educating the patient, ordering tests,documenting clinical information in the electronic health record, and care coordination.   Dede Query, PA-C Department of Hematology/Oncology [place] [facility] Phone: [phone]

## 2020-10-30 LAB — BETA-2-GLYCOPROTEIN I ABS, IGG/M/A
Beta-2 Glyco I IgG: <9 GPI IgG units (ref 0–20)
Beta-2-Glycoprotein I IgA: <9 GPI IgA units (ref 0–25)
Beta-2-Glycoprotein I IgM: <9 GPI IgM units (ref 0–32)

## 2020-10-30 LAB — CARDIOLIPIN ANTIBODIES, IGG, IGM, IGA
Anticardiolipin IgA: <9 APL U/mL (ref 0–11)
Anticardiolipin IgG: <9 GPL U/mL (ref 0–14)
Anticardiolipin IgM: <9 MPL U/mL (ref 0–12)

## 2020-10-30 LAB — PROTEIN C, TOTAL: Protein C, Total: 109 % (ref 60–150)

## 2020-10-31 ENCOUNTER — Telehealth: Payer: Self-pay | Admitting: Physician Assistant

## 2020-10-31 LAB — PROTEIN S ACTIVITY: Protein S Activity: 118 % (ref 63–140)

## 2020-10-31 LAB — LUPUS ANTICOAGULANT PANEL
DRVVT: 108.6 s — ABNORMAL HIGH (ref 0.0–47.0)
PTT Lupus Anticoagulant: 41.6 s (ref 0.0–51.9)

## 2020-10-31 LAB — PROTEIN S, TOTAL: Protein S Ag, Total: 73 % (ref 60–150)

## 2020-10-31 LAB — DRVVT MIX: dRVVT Mix: 84 s — ABNORMAL HIGH (ref 0.0–40.4)

## 2020-10-31 LAB — PROTEIN C ACTIVITY: Protein C Activity: 114 % (ref 73–180)

## 2020-10-31 LAB — DRVVT CONFIRM: dRVVT Confirm: 1.8 ratio — ABNORMAL HIGH (ref 0.8–1.2)

## 2020-10-31 NOTE — Telephone Encounter (Signed)
Scheduled per los. Called and spoke with patient. Confirmed appt 

## 2020-11-03 LAB — PROTHROMBIN GENE MUTATION

## 2020-11-03 LAB — FACTOR 5 LEIDEN

## 2020-11-04 ENCOUNTER — Telehealth: Payer: Self-pay | Admitting: Physician Assistant

## 2020-11-04 NOTE — Telephone Encounter (Signed)
I called [name] to review the hypercoagulable workup from 10/28/2020.  I explained that the work-up was unremarkable except for elevated lupus anticoagulant which can be falsely elevated while on Xarelto.  Recommended to continue Xarelto as prescribed.  We will see the patient back in December 2022 to discuss discontinuation of anticoagulation.  Jessica Rodgers expressed understanding and satisfaction with the plan provided.

## 2020-11-04 NOTE — Telephone Encounter (Signed)
I tried to reach [name] to review the lab results from 10/28/2020. Unable to reach her so I left a voicemail to return my call.

## 2020-11-11 ENCOUNTER — Encounter (HOSPITAL_BASED_OUTPATIENT_CLINIC_OR_DEPARTMENT_OTHER): Payer: Self-pay | Admitting: Cardiology

## 2020-11-11 ENCOUNTER — Other Ambulatory Visit: Payer: Self-pay

## 2020-11-11 ENCOUNTER — Ambulatory Visit (INDEPENDENT_AMBULATORY_CARE_PROVIDER_SITE_OTHER): Payer: BC Managed Care – PPO | Admitting: Cardiology

## 2020-11-11 VITALS — BP 118/74 | HR 76 | Ht 63.0 in | Wt 207.0 lb

## 2020-11-11 DIAGNOSIS — Z86711 Personal history of pulmonary embolism: Secondary | ICD-10-CM | POA: Diagnosis not present

## 2020-11-11 DIAGNOSIS — I10 Essential (primary) hypertension: Secondary | ICD-10-CM

## 2020-11-11 DIAGNOSIS — Z86718 Personal history of other venous thrombosis and embolism: Secondary | ICD-10-CM | POA: Diagnosis not present

## 2020-11-11 DIAGNOSIS — Z7901 Long term (current) use of anticoagulants: Secondary | ICD-10-CM

## 2020-11-11 DIAGNOSIS — Z7189 Other specified counseling: Secondary | ICD-10-CM

## 2020-11-11 DIAGNOSIS — I519 Heart disease, unspecified: Secondary | ICD-10-CM

## 2020-11-11 NOTE — Patient Instructions (Signed)
Medication Instructions:  none *If you need a refill on your cardiac medications before your next appointment, please call your pharmacy*  Lab Work: None  Testing/Procedures: Echocardiogram - Your physician has requested that you have an echocardiogram. Echocardiography is a painless test that uses sound waves to create images of your heart. It provides your doctor with information about the size and shape of your heart and how well your heart's chambers and valves are working. This procedure takes approximately one hour. There are no restrictions for this procedure.   Follow-Up: Your next appointment:  as needed  with Buford Dresser, MD  At [facility], you and your health needs are our priority.  As part of our continuing mission to provide you with exceptional heart care, we have created designated Provider Care Teams.  These Care Teams include your primary Cardiologist (physician) and Advanced Practice Providers (APPs -  Physician Assistants and Nurse Practitioners) who all work together to provide you with the care you need, when you need it.

## 2020-11-11 NOTE — Progress Notes (Signed)
Cardiology Office Note:    Date:  11/11/2020   ID:  Jessica Rodgers, DOB 04-16-1975, MRN [id number]  PCP:  Glendale Chard, MD  Cardiologist:  Buford Dresser, MD  Referring MD: Glendale Chard, MD   CC: new patient consultation for hypertension   History of Present Illness:    Jessica Rodgers is a 44 y.o. female with a hx of pulmonary embolism/DVT 08/2020, hypertension who is seen as a new consult at the request of Glendale Chard, MD for the evaluation and management of hypertension.  Note from 10/13/20 from Dr. Baird Cancer reviewed. Referred for hypertension.  Today: Initially was having issues with shortness of breath. Was concerned it might be a heart issue. Found instead to have pulmonary embolism. Has been on DOAC since June. Only issues is heavy flow during her period. Shortness of breath has improved, only notes that it occurs when she overexerts herself (going up a hill, walking a far distance).   Cardiovascular risk factors: Prior clinical ASCVD: none Comorbid conditions, including hypertension, hyperlipidemia, diabetes, chronic kidney disease: hypertension Metabolic syndrome/Obesity: highest adult weight 238 lbs, lost weight with lifestyle and Qsymia Chronic inflammatory conditions: none Tobacco use history: former, rare slips Family history: no family history of heart disease or stroke for sure (maternal uncle may have had stroke) Prior cardiac testing and/or incidental findings on other testing (ie coronary calcium): echo reviewed, no other Exercise level: getting back to walking, goes fishing 1-2 times/week. Current diet: a lot of salad, grilled and baked food, fruit. Rare fried food, little fast food  Denies chest pain, shortness of breath at rest. No PND, orthopnea, LE edema or unexpected weight gain. No syncope or palpitations.   Past Medical History:  Diagnosis Date   Gallstones    GERD (gastroesophageal reflux disease)    Hypertension    Pulmonary  emboli [facility])     Past Surgical History:  Procedure Laterality Date   BREAST EXCISIONAL BIOPSY Right 04/07/2017   [place]   RADIOACTIVE SEED GUIDED EXCISIONAL BREAST BIOPSY Right 04/07/2017   Procedure: RIGHT RADIOACTIVE SEED GUIDED EXCISIONAL BREAST BIOPSY;  Surgeon: Rolm Bookbinder, MD;  Location: [facility];  Service: General;  Laterality: Right;   TUBAL LIGATION  1997    Current Medications: Current Outpatient Medications on File Prior to Visit  Medication Sig   Ascorbic Acid (VITAMIN C) 1000 MG tablet Take 1,000 mg by mouth daily. As needed   Cholecalciferol (VITAMIN D PO) Take 1 tablet by mouth daily. 5,000u   ELDERBERRY PO Take 1 tablet by mouth daily. As needed   rivaroxaban (XARELTO) 20 MG TABS tablet Take 1 tablet (20 mg total) by mouth daily with supper.   valsartan-hydrochlorothiazide (DIOVAN-HCT) 80-12.5 MG tablet Take 1 tablet by mouth every other day.   No current facility-administered medications on file prior to visit.     Allergies:   Patient has no known allergies.   Social History   Tobacco Use   Smoking status: Former    Packs/day: 0.50    Years: 18.00    Pack years: 9.00    Types: Cigarettes    Start date: 02/13/2003    Quit date: 08/09/2020    Years since quitting: 0.2   Smokeless tobacco: Never  Vaping Use   Vaping Use: Never used  Substance Use Topics   Alcohol use: Yes    Alcohol/week: 4.0 standard drinks    Types: 3 Glasses of wine, 1 Standard drinks or equivalent per week   Drug use: No  Family History: family history includes Arthritis in her mother; Breast cancer (age of onset: 44) in her maternal grandmother; Glaucoma in her mother; Hypertension in her mother; Other in her father.  ROS:   Please see the history of present illness.  Additional pertinent ROS: Constitutional: Negative for chills, fever, night sweats, unintentional weight loss  HENT: Negative for ear pain and hearing loss.   Eyes: Negative for loss of  vision and eye pain.  Respiratory: Negative for cough, sputum, wheezing.   Cardiovascular: See HPI. Gastrointestinal: Negative for abdominal pain, melena, and hematochezia.  Genitourinary: Negative for dysuria and hematuria.  Musculoskeletal: Negative for falls and myalgias.  Skin: Negative for itching and rash.  Neurological: Negative for focal weakness, focal sensory changes and loss of consciousness.  Endo/Heme/Allergies: Does bruise/bleed easily.     EKGs/Labs/Other Studies Reviewed:    The following studies were reviewed today: Echo 08/16/20 1. Left ventricular ejection fraction, by estimation, is 60 to 65%. The  left ventricle has normal function. The left ventricle has no regional  wall motion abnormalities. There is mild left ventricular hypertrophy.  Left ventricular diastolic parameters  are indeterminate. There is the interventricular septum is flattened in  systole and diastole, consistent with right ventricular pressure and  volume overload.   2. Right ventricular systolic function is severely reduced. The right  ventricular size is moderately enlarged. There is severely elevated  pulmonary artery systolic pressure. The estimated right ventricular  systolic pressure is [id number] mmHg. McConnell's  sign consistent with right ventricular strain from pulmonary embolus.   3. The mitral valve is grossly normal. Trivial mitral valve  regurgitation.   4. Tricuspid valve regurgitation is mild to moderate.   5. The aortic valve is tricuspid. Aortic valve regurgitation is not  visualized. Mild to moderate aortic valve sclerosis/calcification is  present, without any evidence of aortic stenosis. Aortic valve mean  gradient measures 3.0 mmHg.   6. The inferior vena cava is normal in size with <50% respiratory  variability, suggesting right atrial pressure of 8 mmHg.   EKG:  EKG is personally reviewed.   08/15/2020 SR at 81 bpm  Recent Labs: 08/15/2020: B Natriuretic Peptide  523.5 08/18/2020: Magnesium 1.7; TSH 3.354 10/28/2020: ALT 14; BUN 12; Creatinine 1.02; Hemoglobin 12.6; Platelet Count 283; Potassium 4.4; Sodium 137  Recent Lipid Panel    Component Value Date/Time   CHOL 183 10/13/2020 1056   TRIG 72 10/13/2020 1056   HDL 88 10/13/2020 1056   CHOLHDL 2.1 10/13/2020 1056   LDLCALC 82 10/13/2020 1056    Physical Exam:    VS:  BP 118/74   Pulse 76   Ht '5\' 3"'$  (1.6 m)   Wt 207 lb (93.9 kg)   SpO2 98%   BMI 36.67 kg/m     Wt Readings from Last 3 Encounters:  11/11/20 207 lb (93.9 kg)  10/28/20 205 lb 1.6 oz (93 kg)  10/13/20 204 lb (92.5 kg)    GEN: Well nourished, well developed in no acute distress HEENT: Normal, moist mucous membranes NECK: No JVD CARDIAC: regular rhythm, normal S1 and S2, no rubs or gallops. No murmur. VASCULAR: Radial and DP pulses 2+ bilaterally. No carotid bruits RESPIRATORY:  Clear to auscultation without rales, wheezing or rhonchi  ABDOMEN: Soft, non-tender, non-distended MUSCULOSKELETAL:  Ambulates independently SKIN: Warm and dry, no edema NEUROLOGIC:  Alert and oriented x 3. No focal neuro deficits noted. PSYCHIATRIC:  Normal affect    ASSESSMENT:    1. Right ventricular dysfunction  2. History of pulmonary embolism   3. History of DVT (deep vein thrombosis)   4. Primary hypertension   5. Long term current use of anticoagulant   6. Cardiac risk counseling   7. Counseling on health promotion and disease prevention    PLAN:    Shortness of breath Pulmonary embolism/DVT Right ventricular dysfunction Long term anticoagulation, on DOAC -feeling much better since treatment -does have menorrhagia, working with her providers to deal with this -had RV dysfunction on initial echo. Will repeat echo (nearly 3 mos) to make sure pulmonary pressures and RV function have normalized  Hypertension -well controlled on valsartan-HCTZ, continue  Cardiac risk counseling and prevention recommendations: -recommend heart  healthy/Mediterranean diet, with whole grains, fruits, vegetable, fish, lean meats, nuts, and olive oil. Limit salt. -recommend moderate walking, 3-5 times/week for 30-50 minutes each session. Aim for at least 150 minutes.week. Goal should be pace of 3 miles/hours, or walking 1.5 miles in 30 minutes -recommend avoidance of tobacco products. Avoid excess alcohol. -ASCVD risk score: The 10-year ASCVD risk score Mikey Bussing DC Brooke Bonito., et al., 2013) is: 0.8%   Values used to calculate the score:     Age: 60 years     Sex: Female     Is Non-Hispanic African American: Yes     Diabetic: No     Tobacco smoker: No     Systolic Blood Pressure: 123456 mmHg     Is BP treated: Yes     HDL Cholesterol: 88 mg/dL     Total Cholesterol: 183 mg/dL    Plan for follow up: to be determine based on results of echo  Buford Dresser, MD, PhD, [facility]    Medication Adjustments/Labs and Tests Ordered: Current medicines are reviewed at length with the patient today.  Concerns regarding medicines are outlined above.  Orders Placed This Encounter  Procedures   ECHOCARDIOGRAM COMPLETE    No orders of the defined types were placed in this encounter.   Patient Instructions  Medication Instructions:  none *If you need a refill on your cardiac medications before your next appointment, please call your pharmacy*  Lab Work: None  Testing/Procedures: Echocardiogram - Your physician has requested that you have an echocardiogram. Echocardiography is a painless test that uses sound waves to create images of your heart. It provides your doctor with information about the size and shape of your heart and how well your heart's chambers and valves are working. This procedure takes approximately one hour. There are no restrictions for this procedure.   Follow-Up: Your next appointment:  as needed  with Buford Dresser, MD  At [facility], you and your health needs are our priority.  As  part of our continuing mission to provide you with exceptional heart care, we have created designated Provider Care Teams.  These Care Teams include your primary Cardiologist (physician) and Advanced Practice Providers (APPs -  Physician Assistants and Nurse Practitioners) who all work together to provide you with the care you need, when you need it.   Signed, Buford Dresser, MD PhD 11/11/2020 6:21 PM    [place]

## 2020-11-21 ENCOUNTER — Encounter: Payer: Self-pay | Admitting: Internal Medicine

## 2020-11-22 ENCOUNTER — Other Ambulatory Visit: Payer: Self-pay

## 2020-11-22 MED ORDER — RIVAROXABAN 20 MG PO TABS: 20.0000 mg | ORAL_TABLET | Freq: Every day | ORAL | 1 refills | Status: DC

## 2020-12-04 ENCOUNTER — Ambulatory Visit (INDEPENDENT_AMBULATORY_CARE_PROVIDER_SITE_OTHER): Payer: BC Managed Care – PPO

## 2020-12-04 ENCOUNTER — Other Ambulatory Visit: Payer: Self-pay

## 2020-12-04 DIAGNOSIS — I5189 Other ill-defined heart diseases: Secondary | ICD-10-CM | POA: Diagnosis not present

## 2020-12-04 DIAGNOSIS — I519 Heart disease, unspecified: Secondary | ICD-10-CM

## 2020-12-05 LAB — ECHOCARDIOGRAM COMPLETE
Area-P 1/2: 3.27 cm2
S' Lateral: 2.7 cm

## 2020-12-06 ENCOUNTER — Other Ambulatory Visit: Payer: Self-pay | Admitting: Internal Medicine

## 2020-12-16 ENCOUNTER — Other Ambulatory Visit: Payer: Self-pay | Admitting: Internal Medicine

## 2020-12-16 DIAGNOSIS — Z1231 Encounter for screening mammogram for malignant neoplasm of breast: Secondary | ICD-10-CM

## 2020-12-29 ENCOUNTER — Encounter: Payer: Self-pay | Admitting: Internal Medicine

## 2021-01-01 ENCOUNTER — Telehealth: Payer: Self-pay | Admitting: Cardiology

## 2021-01-01 NOTE — Telephone Encounter (Signed)
Patient returning call for echo results. 

## 2021-01-01 NOTE — Telephone Encounter (Signed)
Pt updated with ECHO results and verbalized understanding.  

## 2021-01-02 ENCOUNTER — Ambulatory Visit: Payer: BC Managed Care – PPO

## 2021-01-22 ENCOUNTER — Ambulatory Visit (INDEPENDENT_AMBULATORY_CARE_PROVIDER_SITE_OTHER): Payer: BC Managed Care – PPO | Admitting: Internal Medicine

## 2021-01-22 ENCOUNTER — Other Ambulatory Visit: Payer: Self-pay

## 2021-01-22 ENCOUNTER — Encounter: Payer: Self-pay | Admitting: Internal Medicine

## 2021-01-22 VITALS — BP 132/80 | HR 74 | Temp 98.2°F | Ht 63.0 in | Wt 210.0 lb

## 2021-01-22 DIAGNOSIS — I10 Essential (primary) hypertension: Secondary | ICD-10-CM

## 2021-01-22 DIAGNOSIS — Z2821 Immunization not carried out because of patient refusal: Secondary | ICD-10-CM | POA: Diagnosis not present

## 2021-01-22 DIAGNOSIS — L03313 Cellulitis of chest wall: Secondary | ICD-10-CM

## 2021-01-22 DIAGNOSIS — E6609 Other obesity due to excess calories: Secondary | ICD-10-CM

## 2021-01-22 DIAGNOSIS — Z6837 Body mass index (BMI) 37.0-37.9, adult: Secondary | ICD-10-CM

## 2021-01-22 MED ORDER — DOXYCYCLINE HYCLATE 100 MG PO TABS: 100.0000 mg | ORAL_TABLET | Freq: Two times a day (BID) | ORAL | 0 refills | Status: DC

## 2021-01-22 NOTE — Patient Instructions (Signed)

## 2021-01-22 NOTE — Progress Notes (Signed)
Rich Brave [place],acting as a Education administrator for Maximino Greenland, MD.,have documented all relevant documentation on the behalf of Maximino Greenland, MD,as directed by  Maximino Greenland, MD while in the presence of Maximino Greenland, MD.  This visit occurred during the SARS-CoV-2 public health emergency.  Safety protocols were in place, including screening questions prior to the visit, additional usage of staff PPE, and extensive cleaning of exam room while observing appropriate contact time as indicated for disinfecting solutions.  Subjective:     Patient ID: Jessica Rodgers , female    DOB: 07/30/1975 , 45 y.o.   MRN: [id number]   Chief Complaint  Patient presents with   Hypertension    HPI  The patient is here today for bp check. She reports compliance with meds. She denies headaches, chest pain and shortness of breath.    Hypertension This is a chronic problem. The current episode started more than 1 year ago. The problem has been gradually improving since onset. The problem is controlled. Pertinent negatives include no blurred vision, chest pain, palpitations or shortness of breath. Past treatments include ACE inhibitors, angiotensin blockers and diuretics. The current treatment provides moderate improvement.    Past Medical History:  Diagnosis Date   Gallstones    GERD (gastroesophageal reflux disease)    Hypertension    Pulmonary emboli ([facility])      Family History  Problem Relation Age of Onset   Hypertension Mother    Arthritis Mother    Glaucoma Mother    Other Father        unsure of his health   Breast cancer Maternal Grandmother 36     Current Outpatient Medications:    Ascorbic Acid (VITAMIN C) 1000 MG tablet, Take 1,000 mg by mouth daily. As needed, Disp: , Rfl:    Cholecalciferol (VITAMIN D PO), Take 1 tablet by mouth daily. 5,000u, Disp: , Rfl:    doxycycline (VIBRA-TABS) 100 MG tablet, Take 1 tablet (100 mg total) by mouth 2 (two) times daily., Disp: 14 tablet,  Rfl: 0   ELDERBERRY PO, Take 1 tablet by mouth daily. As needed, Disp: , Rfl:    rivaroxaban (XARELTO) 20 MG TABS tablet, Take 1 tablet (20 mg total) by mouth daily with supper., Disp: 90 tablet, Rfl: 1   valsartan-hydrochlorothiazide (DIOVAN-HCT) 80-12.5 MG tablet, TAKE 1 TABLET BY MOUTH EVERY DAY, Disp: 90 tablet, Rfl: 1   No Known Allergies   Review of Systems  Constitutional: Negative.   Eyes:  Negative for blurred vision.  Respiratory: Negative.  Negative for shortness of breath.   Cardiovascular: Negative.  Negative for chest pain and palpitations.  Gastrointestinal: Negative.   Skin:        States she noticed red,itchy rash on right side of her chest. First noticed it a month ago. States she first noticed a scratch, and then the rash appeared to spread. She thinks it could be an allergic reaction. Denies eating new foods. She has been rubbing alcohol on it and she thinks it is starting to go away.   Neurological: Negative.   Psychiatric/Behavioral: Negative.      Today's Vitals   01/22/21 1121  BP: 132/80  Pulse: 74  Temp: 98.2 F (36.8 C)  Weight: 210 lb (95.3 kg)  Height: $Remove'5\' 3"'[id number]$  (1.6 m)  PainSc: 0-No pain   Body mass index is 37.2 kg/m.   Wt Readings from Last 3 Encounters:  01/22/21 210 lb (95.3 kg)  11/11/20 207 lb (93.9 kg)  10/28/20 205 lb 1.6 oz (93 kg)   BP Readings from Last 3 Encounters:  01/22/21 132/80  11/11/20 118/74  10/28/20 127/86     Objective:  Physical Exam Vitals and nursing note reviewed.  Constitutional:      Appearance: Normal appearance. She is obese.  HENT:     Head: Normocephalic and atraumatic.     Nose:     Comments: Masked     Mouth/Throat:     Comments: Masked  Cardiovascular:     Rate and Rhythm: Normal rate and regular rhythm.     Heart sounds: Normal heart sounds.  Pulmonary:     Effort: Pulmonary effort is normal.     Breath sounds: Normal breath sounds.  Musculoskeletal:     Cervical back: Normal range of motion.   Skin:    General: Skin is warm.     Findings: Erythema present.     Comments: Area of erythema/warmth on right anterior chest. No vesicular lesions noted.   Neurological:     General: No focal deficit present.     Mental Status: She is alert.  Psychiatric:        Mood and Affect: Mood normal.        Behavior: Behavior normal.        Assessment And Plan:     1. Essential hypertension Comments: Chronic, fair control. Goal BP <130/80. Encouraged to follow low sodium diet. - BMP8+eGFR - Insulin, random(561)  2. Cellulitis of chest wall Comments: I will send rx doxycycline w/ BID dosing to local pharmacy. Encouraged to complete full course.   3. Class 2 severe obesity due to excess calories with serious comorbidity and body mass index (BMI) of 37.0 to 37.9 in adult [facility]) Comments: We discussed the use of Wegovy to address obesity. She denies family hx thyroid cancer. Will discuss how to administer when samples are available.   4. COVID-19 vaccination declined  5. Influenza vaccination declined   Patient was given opportunity to ask questions. Patient verbalized understanding of the plan and was able to repeat key elements of the plan. All questions were answered to their satisfaction.   I, Maximino Greenland, MD, have reviewed all documentation for this visit. The documentation on 01/22/21 for the exam, diagnosis, procedures, and orders are all accurate and complete.   IF YOU HAVE BEEN REFERRED TO A SPECIALIST, IT MAY TAKE 1-2 WEEKS TO SCHEDULE/PROCESS THE REFERRAL. IF YOU HAVE NOT HEARD FROM US/SPECIALIST IN TWO WEEKS, PLEASE GIVE [place] A CALL AT [phone].   THE PATIENT IS ENCOURAGED TO PRACTICE SOCIAL DISTANCING DUE TO THE COVID-19 PANDEMIC.

## 2021-01-23 LAB — BMP8+EGFR
BUN/Creatinine Ratio: 12 (ref 9–23)
BUN: 11 mg/dL (ref 6–24)
CO2: 23 mmol/L (ref 20–29)
Calcium: 9.3 mg/dL (ref 8.7–10.2)
Chloride: 105 mmol/L (ref 96–106)
Creatinine, Ser: 0.91 mg/dL (ref 0.57–1.00)
Glucose: 89 mg/dL (ref 70–99)
Potassium: 4.4 mmol/L (ref 3.5–5.2)
Sodium: 139 mmol/L (ref 134–144)
eGFR: 78 mL/min/{1.73_m2} (ref >59)

## 2021-01-23 LAB — INSULIN, RANDOM: INSULIN: 6.7 u[IU]/mL (ref 2.6–24.9)

## 2021-01-28 ENCOUNTER — Other Ambulatory Visit: Payer: Self-pay | Admitting: Internal Medicine

## 2021-01-28 DIAGNOSIS — I2694 Multiple subsegmental pulmonary emboli without acute cor pulmonale: Secondary | ICD-10-CM

## 2021-01-28 DIAGNOSIS — I10 Essential (primary) hypertension: Secondary | ICD-10-CM

## 2021-01-28 DIAGNOSIS — I824Y9 Acute embolism and thrombosis of unspecified deep veins of unspecified proximal lower extremity: Secondary | ICD-10-CM

## 2021-01-29 ENCOUNTER — Encounter: Payer: Self-pay | Admitting: Internal Medicine

## 2021-01-30 ENCOUNTER — Ambulatory Visit: Payer: BC Managed Care – PPO

## 2021-02-02 ENCOUNTER — Other Ambulatory Visit: Payer: Self-pay

## 2021-02-02 ENCOUNTER — Ambulatory Visit
Admission: RE | Admit: 2021-02-02 | Discharge: 2021-02-02 | Disposition: A | Discharge status: Home / Self Care | Payer: BC Managed Care – PPO | Source: Ambulatory Visit | Attending: Internal Medicine | Admitting: Internal Medicine

## 2021-02-02 DIAGNOSIS — Z1231 Encounter for screening mammogram for malignant neoplasm of breast: Secondary | ICD-10-CM

## 2021-02-13 ENCOUNTER — Ambulatory Visit (HOSPITAL_COMMUNITY)
Admission: RE | Admit: 2021-02-13 | Discharge: 2021-02-13 | Disposition: A | Discharge status: Home / Self Care | Payer: BC Managed Care – PPO | Source: Ambulatory Visit | Attending: Cardiovascular Disease | Admitting: Cardiovascular Disease

## 2021-02-13 ENCOUNTER — Other Ambulatory Visit: Payer: Self-pay

## 2021-02-13 DIAGNOSIS — I824Y9 Acute embolism and thrombosis of unspecified deep veins of unspecified proximal lower extremity: Secondary | ICD-10-CM | POA: Insufficient documentation

## 2021-02-13 DIAGNOSIS — I824Y2 Acute embolism and thrombosis of unspecified deep veins of left proximal lower extremity: Secondary | ICD-10-CM | POA: Diagnosis not present

## 2021-02-19 ENCOUNTER — Other Ambulatory Visit: Payer: Self-pay

## 2021-02-19 ENCOUNTER — Encounter: Payer: Self-pay | Admitting: Internal Medicine

## 2021-02-19 ENCOUNTER — Ambulatory Visit (INDEPENDENT_AMBULATORY_CARE_PROVIDER_SITE_OTHER): Payer: BC Managed Care – PPO | Admitting: Internal Medicine

## 2021-02-19 DIAGNOSIS — I2609 Other pulmonary embolism with acute cor pulmonale: Secondary | ICD-10-CM | POA: Diagnosis not present

## 2021-02-19 DIAGNOSIS — E669 Obesity, unspecified: Secondary | ICD-10-CM

## 2021-02-19 NOTE — Progress Notes (Signed)
Jessica Rodgers, female    DOB: April 12, 1975   MRN: [id number]   Brief patient profile:  35 yobf quit smoking on admit with PE:   Admit date: 08/15/2020 Discharge date: 08/18/2020   Recommendations for Outpatient Follow-up:  Follow up with PCP in 1 week  Outpatient follow-up with pulmonary/Dr. Melvyn Novas Might benefit from outpatient evaluation and follow-up by hematology  Follow up in ED if symptoms worsen or new appear        Brief/Interim Summary: 45 year old female with history of hypertension, GERD, tobacco use and obesity presented with increasing shortness of breath.  On presentation, CTA chest showed extensive bilateral pulmonary embolism with associated right heart strain.  She was treated with heparin drip.  She is currently stable on room air.  She will be discharged home today on oral Xarelto.   Discharge Diagnoses:    Acute bilateral pulmonary embolism with right heart strain Tobacco use -Probably secondary to tobacco use, sedentary job and phentermine topiramate use. -Currently on heparin drip.   -lower extremity duplex ultrasound showed left popliteal and peroneal veins age-indeterminate DVT -2D echo showed findings consistent with right ventricular pressure and volume overload with severely reduced right ventricular systolic function, severely elevated pulmonary artery systolic pressure and right ventricular strain.    Evaluation by Dr. Minna Merritts appreciated.  Outpatient follow-up with pulmonary and patient will probably benefit from outpatient evaluation and follow-up by oncology -Respiratory status currently stable.  Will discharge home on oral Xarelto: Duration to be decided by PCP/pulmonary or oncology as an outpatient   acute kidney injury -Creatinine 1.29 on presentation.  Off IV fluids.  Creatinine 1.16 again today.  Continue to encourage oral intake.  Diuretics remain on hold till reevaluation by PCP.   Hypertension -Blood pressure still on the lower side.   Home regimen on hold.   Obesity --hold phentermine-topirmate in light of PE-need to consider alternative weight loss method -Outpatient follow-up with PCP regarding discussion regarding the same.       History of Present Illness  10/08/2020  Pulmonary/ 1st office eval/ Jessica Rodgers / [place] [facility]  Chief Complaint  Patient presents with   Pulmonary Consult     Referred by Dr. Aline August- pt dx with PE recently, currently on xarelto. Breathing has improved some still not back her her normal baseline.   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    02/19/2021  f/u ov/Jessica Rodgers re: dvt  maint on xarelto   Chief Complaint  Patient presents with   Follow-up    Pt states no concerns   Dyspnea:  no more walking neighborhood though tol adl's fine now  Cough: none  Sleeping: no resp cc on 2 pillows  SABA use: none  02: none  Covid status: never vax / never infected    No obvious day to day or daytime variability or assoc excess/ purulent sputum or mucus plugs or hemoptysis or cp or chest tightness, subjective wheeze or overt sinus or hb symptoms.   Sleeping  without nocturnal  or early am exacerbation  of respiratory  c/o's or need for  noct saba. Also denies any obvious fluctuation of symptoms with weather or environmental changes or other aggravating or alleviating factors except as outlined above   No unusual exposure hx or h/o childhood pna/ asthma or knowledge of premature birth.  Current Allergies, Complete Past Medical History, Past Surgical History, Family History, and Social History were reviewed in [employer] record.  ROS   The following are not active complaints unless bolded Hoarseness, sore throat, dysphagia, dental problems, itching, sneezing,  nasal congestion or discharge of excess mucus or purulent secretions, ear ache,   fever, chills, sweats, unintended wt loss or wt gain, classically pleuritic or exertional cp,  orthopnea pnd or arm/hand swelling  or leg swelling, presyncope, palpitations, abdominal pain, anorexia, nausea, vomiting, diarrhea  or change in bowel habits or change in bladder habits, change in stools or change in urine, dysuria, hematuria,  rash, arthralgias, visual complaints, headache, numbness, weakness or ataxia or problems with walking or coordination,  change in mood or  memory.        Current Meds  Medication Sig   Ascorbic Acid (VITAMIN C) 1000 MG tablet Take 1,000 mg by mouth daily. As needed   Cholecalciferol (VITAMIN D PO) Take 1 tablet by mouth daily. 5,000u   ELDERBERRY PO Take 1 tablet by mouth daily. As needed   rivaroxaban (XARELTO) 20 MG TABS tablet Take 1 tablet (20 mg total) by mouth daily with supper.   valsartan-hydrochlorothiazide (DIOVAN-HCT) 80-12.5 MG tablet TAKE 1 TABLET BY MOUTH EVERY DAY           Objective:      Wt Readings from Last 3 Encounters:  02/19/21 214 lb 9.6 oz (97.3 kg)  01/22/21 210 lb (95.3 kg)  11/11/20 207 lb (93.9 kg)      Vital signs reviewed  02/19/2021  - Note at rest 02 sats  99% on RA   General appearance:    amb mod obese bf nad    HEENT : pt wearing mask not removed for exam due to covid -19 concerns.    NECK :  without JVD/Nodes/TM/ nl carotid upstrokes bilaterally   LUNGS: no acc muscle use,  Nl contour chest which is clear to A and P bilaterally without cough on insp or exp maneuvers   CV:  RRR  no s3 or murmur or increase in P2, and no edema   ABD:  soft and nontender with nl inspiratory excursion in the supine position. No bruits or organomegaly appreciated, bowel sounds nl  MS:  Nl gait/ ext warm without  deformities, calf tenderness, cyanosis or clubbing No obvious joint restrictions   SKIN: warm and dry without lesions    NEURO:  alert, approp, nl sensorium with  no motor or cerebellar deficits apparent.               Assessment

## 2021-02-19 NOTE — Patient Instructions (Addendum)
Ok with me to reduce to half dose at this point but stopping it would need the approval of a hematologist  In any case you need to be as physically active as possible / avoiding long car rides or sitting too much at home.   Pulmonary follow up is as needed

## 2021-02-20 ENCOUNTER — Encounter: Payer: Self-pay | Admitting: Internal Medicine

## 2021-02-20 NOTE — Assessment & Plan Note (Signed)
.   CTa chest 08/15/20     Bilateral PE with RH strain  . Echo 08/16/20  with severe RV dysfunction  With  esp PAS  84 but RAP 8 . Venous doppler 08/16/20 age indeterminate deep vein thrombosis  involving the left popliteal vein, and left peroneal veins > repeat 02/03/21 neg  . Echo 12/04/20 nl > ok to change to prophylactic dosing as of 02/19/2021 but planning to see hematology 1st   Discussed in detail all the  indications, usual  risks and alternatives  relative to the benefits with patient who agrees to proceed with Rx as outlined.      Pulmonary f/u is as needed

## 2021-02-20 NOTE — Assessment & Plan Note (Signed)
  Body mass index is 38.01 kg/m.  -  trending bac up a bit  Lab Results  Component Value Date   TSH 3.354 08/18/2020      Contributing to doe and risk of dvt/ PE / discussed  >>>   reviewed the need and the process to achieve and maintain neg calorie balance > defer f/u primary care including intermittently monitoring thyroid status             Each maintenance medication was reviewed in detail including emphasizing most importantly the difference between maintenance and prns and under what circumstances the prns are to be triggered using an action plan format where appropriate.  Total time for H and P, chart review, counseling, reviewing   and generating customized AVS unique to this office visit / same day charting = 27 min

## 2021-02-26 ENCOUNTER — Other Ambulatory Visit: Payer: Self-pay | Admitting: Physician Assistant

## 2021-02-26 DIAGNOSIS — I2782 Chronic pulmonary embolism: Secondary | ICD-10-CM

## 2021-02-27 ENCOUNTER — Other Ambulatory Visit: Payer: Self-pay

## 2021-02-27 ENCOUNTER — Inpatient Hospital Stay: Payer: BC Managed Care – PPO

## 2021-02-27 ENCOUNTER — Other Ambulatory Visit: Payer: Self-pay | Admitting: Physician Assistant

## 2021-02-27 ENCOUNTER — Telehealth: Payer: Self-pay | Admitting: Physician Assistant

## 2021-02-27 ENCOUNTER — Inpatient Hospital Stay: Payer: BC Managed Care – PPO | Attending: Physician Assistant | Admitting: Physician Assistant

## 2021-02-27 VITALS — BP 132/89 | HR 73 | Temp 98.1°F | Resp 19 | Ht 63.0 in | Wt 213.5 lb

## 2021-02-27 DIAGNOSIS — I2609 Other pulmonary embolism with acute cor pulmonale: Secondary | ICD-10-CM | POA: Diagnosis not present

## 2021-02-27 DIAGNOSIS — I2782 Chronic pulmonary embolism: Secondary | ICD-10-CM

## 2021-02-27 DIAGNOSIS — I82432 Acute embolism and thrombosis of left popliteal vein: Secondary | ICD-10-CM | POA: Diagnosis not present

## 2021-02-27 DIAGNOSIS — F17210 Nicotine dependence, cigarettes, uncomplicated: Secondary | ICD-10-CM | POA: Insufficient documentation

## 2021-02-27 DIAGNOSIS — Z7901 Long term (current) use of anticoagulants: Secondary | ICD-10-CM | POA: Insufficient documentation

## 2021-02-27 DIAGNOSIS — I82532 Chronic embolism and thrombosis of left popliteal vein: Secondary | ICD-10-CM

## 2021-02-27 DIAGNOSIS — I2699 Other pulmonary embolism without acute cor pulmonale: Secondary | ICD-10-CM | POA: Diagnosis not present

## 2021-02-27 DIAGNOSIS — N920 Excessive and frequent menstruation with regular cycle: Secondary | ICD-10-CM | POA: Insufficient documentation

## 2021-02-27 DIAGNOSIS — D649 Anemia, unspecified: Secondary | ICD-10-CM

## 2021-02-27 LAB — RETIC PANEL
Immature Retic Fract: 21.8 % — ABNORMAL HIGH (ref 2.3–15.9)
RBC.: 3.62 MIL/uL — ABNORMAL LOW (ref 3.87–5.11)
Retic Count, Absolute: 94.1 10*3/uL (ref 19.0–186.0)
Retic Ct Pct: 2.6 % (ref 0.4–3.1)
Reticulocyte Hemoglobin: 30.9 pg (ref >27.9)

## 2021-02-27 LAB — CBC WITH DIFFERENTIAL (CANCER CENTER ONLY)
Abs Immature Granulocytes: 0.02 10*3/uL (ref 0.00–0.07)
Basophils Absolute: 0 10*3/uL (ref 0.0–0.1)
Basophils Relative: 1 %
Eosinophils Absolute: 0.1 10*3/uL (ref 0.0–0.5)
Eosinophils Relative: 1 %
HCT: 33.6 % — ABNORMAL LOW (ref 36.0–46.0)
Hemoglobin: 10.8 g/dL — ABNORMAL LOW (ref 12.0–15.0)
Immature Granulocytes: 0 %
Lymphocytes Relative: 32 %
Lymphs Abs: 2.1 10*3/uL (ref 0.7–4.0)
MCH: 29.8 pg (ref 26.0–34.0)
MCHC: 32.1 g/dL (ref 30.0–36.0)
MCV: 92.8 fL (ref 80.0–100.0)
Monocytes Absolute: 0.4 10*3/uL (ref 0.1–1.0)
Monocytes Relative: 7 %
Neutro Abs: 4 10*3/uL (ref 1.7–7.7)
Neutrophils Relative %: 59 %
Platelet Count: 332 10*3/uL (ref 150–400)
RBC: 3.62 MIL/uL — ABNORMAL LOW (ref 3.87–5.11)
RDW: 13.8 % (ref 11.5–15.5)
WBC Count: 6.6 10*3/uL (ref 4.0–10.5)
nRBC: 0 % (ref 0.0–0.2)

## 2021-02-27 LAB — CMP (CANCER CENTER ONLY)
ALT: 11 U/L (ref 0–44)
AST: 19 U/L (ref 15–41)
Albumin: 3.3 g/dL — ABNORMAL LOW (ref 3.5–5.0)
Alkaline Phosphatase: 46 U/L (ref 38–126)
Anion gap: 7 (ref 5–15)
BUN: 15 mg/dL (ref 6–20)
CO2: 22 mmol/L (ref 22–32)
Calcium: 8.8 mg/dL — ABNORMAL LOW (ref 8.9–10.3)
Chloride: 107 mmol/L (ref 98–111)
Creatinine: 0.91 mg/dL (ref 0.44–1.00)
GFR, Estimated: >60 mL/min (ref >60)
Glucose, Bld: 93 mg/dL (ref 70–99)
Potassium: 4.2 mmol/L (ref 3.5–5.1)
Sodium: 136 mmol/L (ref 135–145)
Total Bilirubin: 0.3 mg/dL (ref 0.3–1.2)
Total Protein: 7.4 g/dL (ref 6.5–8.1)

## 2021-02-27 NOTE — Progress Notes (Signed)
[facility] [phone]   [phone]  PROGRESS NOTE  Patient Care Team: Glendale Chard, MD as PCP - General (Internal Medicine) Buford Dresser, MD as PCP - Cardiology (Cardiology)  Hematological/Oncological History 1) 08/15/2020-08/18/2020: Admitted after presenting with increasing shortness of breath. CTA chest showed extensive bilateral pulmonary emboli with associated right heart strain. Doppler US revealed age indeterminate DVT involving the left popliteal vein and let peroneal veins.Treated with heparin drip and discharged home on Xarelto.   2) 10/28/2020: Establish care with Dede Query PA-C  CHIEF COMPLAINTS/PURPOSE OF CONSULTATION:  "Bilateral Pulmonary Emboli and Left LE DVT"  HISTORY OF PRESENTING ILLNESS:  Jessica Rodgers 45 y.o. female who presents to the clinic for a follow-up for history of pulmonary emboli and DVT currently on Xarelto 20 mg once daily.  On exam today, Jessica Rodgers reports her energy levels are overall stable.  She continues to work full-time and completes all her daily activities on her own.  Patient's appetite is stable but has noticed weight gain primarily due to a more sedentary lifestyle and discontinuing her weight loss medication.  She denies any nausea, vomiting or abdominal pain.  Her bowel habits are unchanged without any diarrhea or constipation.  Patient reports that her menstrual bleeding has become heavier since initiating anticoagulation.  Her cycles last 4 to 5 days with 2 to 3 days of heavy bleeding.  She notes that during the heavy days, she changes her pad every 1 to 1-1/2 hours.  She currently takes oral iron supplementation during her menstrual cycle only.  She denies any fevers, chills, night sweats, shortness of breath, chest pain or cough.  She has no other complaints.  Rest of the 10 point ROS is below.  MEDICAL HISTORY:  Past Medical History:  Diagnosis Date   Gallstones    GERD  (gastroesophageal reflux disease)    Hypertension    Pulmonary emboli ([facility])     SURGICAL HISTORY: Past Surgical History:  Procedure Laterality Date   BREAST EXCISIONAL BIOPSY Right 04/07/2017   [place]   RADIOACTIVE SEED GUIDED EXCISIONAL BREAST BIOPSY Right 04/07/2017   Procedure: RIGHT RADIOACTIVE SEED GUIDED EXCISIONAL BREAST BIOPSY;  Surgeon: Rolm Bookbinder, MD;  Location: [facility];  Service: General;  Laterality: Right;   TUBAL LIGATION  1997    SOCIAL HISTORY: Social History   Socioeconomic History   Marital status: Divorced    Spouse name: Not on file   Number of children: Not on file   Years of education: Not on file   Highest education level: Not on file  Occupational History   Not on file  Tobacco Use   Smoking status: Former    Packs/day: 0.50    Years: 18.00    Pack years: 9.00    Types: Cigarettes    Start date: 02/13/2003    Quit date: 08/09/2020    Years since quitting: 0.5   Smokeless tobacco: Never  Vaping Use   Vaping Use: Never used  Substance and Sexual Activity   Alcohol use: Yes    Alcohol/week: 4.0 standard drinks    Types: 3 Glasses of wine, 1 Standard drinks or equivalent per week   Drug use: No   Sexual activity: Not on file  Other Topics Concern   Not on file  Social History Narrative   Not on file   Social Determinants of Health   Financial Resource Strain: Not on file  Food Insecurity: Not on file  Transportation Needs: Not on  file  Physical Activity: Not on file  Stress: Not on file  Social Connections: Not on file  Intimate Partner Violence: Not on file    FAMILY HISTORY: Family History  Problem Relation Age of Onset   Hypertension Mother    Arthritis Mother    Glaucoma Mother    Other Father        unsure of his health   Breast cancer Maternal Grandmother 35    ALLERGIES:  has No Known Allergies.  MEDICATIONS:  Current Outpatient Medications  Medication Sig Dispense Refill   Ascorbic Acid  (VITAMIN C) 1000 MG tablet Take 1,000 mg by mouth daily. As needed     Cholecalciferol (VITAMIN D PO) Take 1 tablet by mouth daily. 5,000u     ELDERBERRY PO Take 1 tablet by mouth daily. As needed     rivaroxaban (XARELTO) 20 MG TABS tablet Take 1 tablet (20 mg total) by mouth daily with supper. 90 tablet 1   valsartan-hydrochlorothiazide (DIOVAN-HCT) 80-12.5 MG tablet TAKE 1 TABLET BY MOUTH EVERY DAY 90 tablet 1   No current facility-administered medications for this visit.    REVIEW OF SYSTEMS:   Constitutional: ( - ) fevers, ( - )  chills , ( - ) night sweats Eyes: ( - ) blurriness of vision, ( - ) double vision, ( - ) watery eyes Ears, nose, mouth, throat, and face: ( - ) mucositis, ( - ) sore throat Respiratory: ( - ) cough, ( - ) dyspnea, ( - ) wheezes Cardiovascular: ( - ) palpitation, ( - ) chest discomfort, ( - ) lower extremity swelling Gastrointestinal:  ( - ) nausea, ( - ) heartburn, ( - ) change in bowel habits Skin: ( - ) abnormal skin rashes Lymphatics: ( - ) new lymphadenopathy, ( - ) easy bruising Neurological: ( - ) numbness, ( - ) tingling, ( - ) new weaknesses Behavioral/Psych: ( - ) mood change, ( - ) new changes  All other systems were reviewed with the patient and are negative.  PHYSICAL EXAMINATION: ECOG PERFORMANCE STATUS: 1 - Symptomatic but completely ambulatory  Vitals:   02/27/21 0817  BP: 132/89  Pulse: 73  Resp: 19  Temp: 98.1 F (36.7 C)  SpO2: 99%   Filed Weights   02/27/21 0817  Weight: 213 lb 8 oz (96.8 kg)    GENERAL: well appearing female in NAD  SKIN: skin color, texture, turgor are normal, no rashes or significant lesions EYES: conjunctiva are pink and non-injected, sclera clear NECK: supple, non-tender LYMPH:  no palpable lymphadenopathy in the cervical or supraclavicular lymph nodes.  LUNGS: clear to auscultation and percussion with normal breathing effort HEART: regular rate & rhythm and no murmurs and no lower extremity  edema Musculoskeletal: no cyanosis of digits and no clubbing  PSYCH: alert & oriented x 3, fluent speech NEURO: no focal motor/sensory deficits  LABORATORY DATA:  I have reviewed the data as listed CBC Latest Ref Rng & Units 02/27/2021 10/28/2020 10/13/2020  WBC 4.0 - 10.5 K/uL 6.6 5.9 6.0  Hemoglobin 12.0 - 15.0 g/dL 10.8(L) 12.6 11.8  Hematocrit 36.0 - 46.0 % 33.6(L) 36.8 35.6  Platelets 150 - 400 K/uL 332 283 303    CMP Latest Ref Rng & Units 02/27/2021 01/22/2021 10/28/2020  Glucose 70 - 99 mg/dL 93 89 105(H)  BUN 6 - 20 mg/dL 15 11 12   Creatinine 0.44 - 1.00 mg/dL 0.91 0.91 1.02(H)  Sodium 135 - 145 mmol/L 136 139 137  Potassium 3.5 -  5.1 mmol/L 4.2 4.4 4.4  Chloride 98 - 111 mmol/L 107 105 106  CO2 22 - 32 mmol/L 22 23 24   Calcium 8.9 - 10.3 mg/dL 8.8(L) 9.3 9.1  Total Protein 6.5 - 8.1 g/dL 7.4 - 7.7  Total Bilirubin 0.3 - 1.2 mg/dL 0.3 - 0.4  Alkaline Phos 38 - 126 U/L 46 - 43  AST 15 - 41 U/L 19 - 21  ALT 0 - 44 U/L 11 - 14   RADIOGRAPHIC STUDIES: I have personally reviewed the radiological images as listed and agreed with the findings in the report.  08/15/2020: CTA Chest: 1. Extensive bilateral pulmonary embolism with associated right heart strain. 2. Cholelithiasis.  08/16/2020: [place] Bilateral LE: Findings consistent with age indeterminate deep vein thrombosis  involving the left popliteal vein, and left peroneal veins.   ASSESSMENT & PLAN Jessica Rodgers is a 45 y.o. female for a follow up for bilateral extensive pulmonary emboli and left lower extremity DVT that was diagnosed in June 2022.   #Extensive bilateral pulmonary emboli and LLE DVT: -CTA chest from 08/15/2020 showed extensive bilateral pulmonary embolism with associated right heart strain. Doppler US on 08/16/2020 showed DVT involving the left popliteal vein and left peroneal veins.  --Secondary to smoking and sedentary lifestyle. Patient continues to smoke occasionally and sits for long periods of  time as she works from home.  --Hypercoagulable workup was negative for any clotting disorders.  --Patient has been on anticoagulation for 6 months and we discussed risks versus benefits of continuing on Xarelto. Since patient reports that she has a sedentary lifestyle and smokes intermittently, we decided to continue on anticoagulation.  --Recommend to proceed with maintenance dose of Xarelto 10 mg once daily --I advised against skipping doses of Xarelto. Patient will see if menstrual bleeding improves with maintenance dose.   #Normocytic anemia: --Labs today show anemia with Hgb of 10.8, MCV 92.8. I suspect iron deficiency due to heavier menstrual bleeding but unable to add iron studies to today's blood specimen. Patient will return next Monday to check iron studies.  --She is currently taking oral iron supplementation during her menstrual cycle only. --If upcoming labs confirm iron deficiency, recommend ferrous sulfate 325 mg once daily with a source of vitamin C.   Orders Placed This Encounter  Procedures   Iron and TIBC    Standing Status:   Future    Standing Expiration Date:   02/27/2022   Ferritin    Standing Status:   Future    Standing Expiration Date:   02/27/2022   Retic Panel    Standing Status:   Future    Standing Expiration Date:   02/27/2022     All questions were answered. The patient knows to call the clinic with any problems, questions or concerns.  I have spent a total of 30 minutes minutes of face-to-face and non-face-to-face time, preparing to see the [place] a medically appropriate examination, counseling and educating the patient, ordering tests, documenting clinical information in the electronic health record, and care coordination.     Dede Query, PA-C Department of Hematology/Oncology [place] at [facility] Phone: [phone]

## 2021-02-27 NOTE — Telephone Encounter (Signed)
Scheduled per 12/16 los, pt has been called and confirmed appts

## 2021-03-02 ENCOUNTER — Inpatient Hospital Stay: Payer: BC Managed Care – PPO

## 2021-03-02 ENCOUNTER — Other Ambulatory Visit: Payer: Self-pay

## 2021-03-02 DIAGNOSIS — I2699 Other pulmonary embolism without acute cor pulmonale: Secondary | ICD-10-CM | POA: Diagnosis not present

## 2021-03-02 DIAGNOSIS — D649 Anemia, unspecified: Secondary | ICD-10-CM

## 2021-03-02 LAB — FOLATE: Folate: 11.8 ng/mL (ref >5.9)

## 2021-03-02 LAB — IRON AND TIBC
Iron: 59 ug/dL (ref 41–142)
Saturation Ratios: 11 % — ABNORMAL LOW (ref 21–57)
TIBC: 526 ug/dL — ABNORMAL HIGH (ref 236–444)
UIBC: 467 ug/dL — ABNORMAL HIGH (ref 120–384)

## 2021-03-02 LAB — FERRITIN: Ferritin: 12 ng/mL (ref 11–307)

## 2021-03-02 LAB — VITAMIN B12: Vitamin B-12: 562 pg/mL (ref 180–914)

## 2021-03-03 ENCOUNTER — Other Ambulatory Visit: Payer: Self-pay | Admitting: Physician Assistant

## 2021-03-03 MED ORDER — FERROUS SULFATE 325 (65 FE) MG PO TBEC: 325.0000 mg | DELAYED_RELEASE_TABLET | Freq: Every day | ORAL | 3 refills | Status: DC

## 2021-03-11 ENCOUNTER — Encounter: Payer: Self-pay | Admitting: Internal Medicine

## 2021-03-22 ENCOUNTER — Other Ambulatory Visit: Payer: Self-pay | Admitting: Internal Medicine

## 2021-03-22 MED ORDER — RIVAROXABAN 10 MG PO TABS: 10.0000 mg | ORAL_TABLET | Freq: Every day | ORAL | 1 refills | Status: DC

## 2021-05-05 ENCOUNTER — Other Ambulatory Visit (HOSPITAL_COMMUNITY): Payer: Self-pay

## 2021-05-05 ENCOUNTER — Ambulatory Visit (INDEPENDENT_AMBULATORY_CARE_PROVIDER_SITE_OTHER): Payer: BC Managed Care – PPO | Admitting: Internal Medicine

## 2021-05-05 ENCOUNTER — Encounter: Payer: Self-pay | Admitting: Internal Medicine

## 2021-05-05 ENCOUNTER — Other Ambulatory Visit: Payer: Self-pay

## 2021-05-05 VITALS — BP 124/70 | HR 68 | Temp 98.7°F | Ht 62.6 in | Wt 215.2 lb

## 2021-05-05 DIAGNOSIS — I10 Essential (primary) hypertension: Secondary | ICD-10-CM

## 2021-05-05 DIAGNOSIS — Z2821 Immunization not carried out because of patient refusal: Secondary | ICD-10-CM | POA: Diagnosis not present

## 2021-05-05 DIAGNOSIS — Z6838 Body mass index (BMI) 38.0-38.9, adult: Secondary | ICD-10-CM

## 2021-05-05 DIAGNOSIS — J301 Allergic rhinitis due to pollen: Secondary | ICD-10-CM

## 2021-05-05 MED ORDER — WEGOVY 0.5 MG/0.5ML ~~LOC~~ SOAJ
0.5000 mg | SUBCUTANEOUS | 0 refills | Status: DC
  Filled 2021-05-05: qty 2, 28d supply, fill #0

## 2021-05-05 NOTE — Patient Instructions (Signed)

## 2021-05-05 NOTE — Progress Notes (Signed)
Rich Brave Llittleton,acting as a Education administrator for Maximino Greenland, MD.,have documented all relevant documentation on the behalf of Maximino Greenland, MD,as directed by  Maximino Greenland, MD while in the presence of Maximino Greenland, MD.  This visit occurred during the SARS-CoV-2 public health emergency.  Safety protocols were in place, including screening questions prior to the visit, additional usage of staff PPE, and extensive cleaning of exam room while observing appropriate contact time as indicated for disinfecting solutions.  Subjective:     Patient ID: Jessica Rodgers , female    DOB: [birth year] , 46 y.o.   MRN: [id number]   Chief Complaint  Patient presents with   Hypertension    HPI  The patient is here today for bp check. She reports compliance with meds. She denies headaches, chest pain and shortness of breath.   Hypertension This is a chronic problem. The current episode started more than 1 year ago. The problem has been gradually improving since onset. The problem is controlled. Pertinent negatives include no blurred vision, chest pain, palpitations or shortness of breath. Past treatments include ACE inhibitors, angiotensin blockers and diuretics. The current treatment provides moderate improvement.    Past Medical History:  Diagnosis Date   Gallstones    GERD (gastroesophageal reflux disease)    Hypertension    Pulmonary emboli ([place])      Family History  Problem Relation Age of Onset   Hypertension Mother    Arthritis Mother    Glaucoma Mother    Other Father        unsure of his health   Breast cancer Maternal Grandmother 42     Current Outpatient Medications:    Ascorbic Acid (VITAMIN C) 1000 MG tablet, Take 1,000 mg by mouth daily. As needed, Disp: , Rfl:    Cholecalciferol (VITAMIN D PO), Take 1 tablet by mouth daily. 5,000u, Disp: , Rfl:    ELDERBERRY PO, Take 1 tablet by mouth daily. As needed, Disp: , Rfl:    ferrous sulfate 325 (65 FE) MG EC tablet, Take 1  tablet (325 mg total) by mouth daily with breakfast., Disp: 30 tablet, Rfl: 3   rivaroxaban (XARELTO) 10 MG TABS tablet, Take 1 tablet (10 mg total) by mouth daily., Disp: 90 tablet, Rfl: 1   Semaglutide-Weight Management (WEGOVY) 0.5 MG/0.5ML SOAJ, Inject 0.5 mg into the skin once a week., Disp: 2 mL, Rfl: 0   valsartan-hydrochlorothiazide (DIOVAN-HCT) 80-12.5 MG tablet, TAKE 1 TABLET BY MOUTH EVERY DAY, Disp: 90 tablet, Rfl: 1   No Known Allergies   Review of Systems  Constitutional: Negative.   Eyes:  Negative for blurred vision.  Respiratory: Negative.  Negative for shortness of breath.   Cardiovascular: Negative.  Negative for chest pain and palpitations.  Neurological: Negative.   Psychiatric/Behavioral: Negative.      Today's Vitals   05/05/21 0953  BP: 124/70  Pulse: 68  Temp: 98.7 F (37.1 C)  Weight: 215 lb 3.2 oz (97.6 kg)  Height: 5' 2.6" (1.59 m)  PainSc: 0-No pain   Body mass index is 38.61 kg/m.  Wt Readings from Last 3 Encounters:  05/05/21 215 lb 3.2 oz (97.6 kg)  02/27/21 213 lb 8 oz (96.8 kg)  02/19/21 214 lb 9.6 oz (97.3 kg)     Objective:  Physical Exam Vitals and nursing note reviewed.  Constitutional:      Appearance: Normal appearance.  HENT:     Head: Normocephalic and atraumatic.  Cardiovascular:  Rate and Rhythm: Normal rate and regular rhythm.     Heart sounds: Normal heart sounds.  Pulmonary:     Effort: Pulmonary effort is normal.     Breath sounds: Normal breath sounds.  Musculoskeletal:     Cervical back: Normal range of motion.  Skin:    General: Skin is warm.  Neurological:     General: No focal deficit present.     Mental Status: She is alert.  Psychiatric:        Mood and Affect: Mood normal.        Behavior: Behavior normal.     Assessment And Plan:     1. Essential hypertension Comments: Chronic, well controlled. No med changes. Encouraged to follow low sodium diet. She will c/w valsartan/hctz.  2. Seasonal  allergic rhinitis due to pollen Comments: She does not wish to take rx montelukast. She wishes to c/w Mucinex NS prn.   3. COVID-19 vaccination declined  4. Class 2 severe obesity due to excess calories with serious comorbidity and body mass index (BMI) of 38.0 to 38.9 in adult [facility])  We discussed the use of Wegovy for obesity. She denies family/personal h/o thyroid cancer. Wegovy samples were not available. She will be contacted when we get some in. She will be shown how to self administer at that time. I wil send rx 0.5mg  weekly to pharmacy to start PA process. She understands that she will not take her next dose until next week. She is reminded to stop eating when full. She will rto  4-6 weeks after starting the medication for re-evaluation.  Possible side effects d/w patient.   Patient was given opportunity to ask questions. Patient verbalized understanding of the plan and was able to repeat key elements of the plan. All questions were answered to their satisfaction.   I, Maximino Greenland, MD, have reviewed all documentation for this visit. The documentation on 05/05/21 for the exam, diagnosis, procedures, and orders are all accurate and complete.   IF YOU HAVE BEEN REFERRED TO A SPECIALIST, IT MAY TAKE 1-2 WEEKS TO SCHEDULE/PROCESS THE REFERRAL. IF YOU HAVE NOT HEARD FROM US/SPECIALIST IN TWO WEEKS, PLEASE GIVE [place] A CALL AT [phone] X [phone].   THE PATIENT IS ENCOURAGED TO PRACTICE SOCIAL DISTANCING DUE TO THE COVID-19 PANDEMIC.

## 2021-05-06 ENCOUNTER — Other Ambulatory Visit (HOSPITAL_COMMUNITY): Payer: Self-pay

## 2021-05-06 ENCOUNTER — Encounter: Payer: Self-pay | Admitting: Internal Medicine

## 2021-05-08 ENCOUNTER — Other Ambulatory Visit (HOSPITAL_COMMUNITY): Payer: Self-pay

## 2021-05-12 ENCOUNTER — Other Ambulatory Visit: Payer: Self-pay

## 2021-05-12 MED ORDER — RYBELSUS 7 MG PO TABS: 7.0000 mg | ORAL_TABLET | Freq: Every day | ORAL | 1 refills | Status: DC

## 2021-05-14 ENCOUNTER — Other Ambulatory Visit (HOSPITAL_COMMUNITY): Payer: Self-pay

## 2021-05-28 ENCOUNTER — Other Ambulatory Visit: Payer: Self-pay | Admitting: *Deleted

## 2021-05-28 ENCOUNTER — Inpatient Hospital Stay: Payer: BC Managed Care – PPO | Admitting: Hematology and Oncology

## 2021-05-28 ENCOUNTER — Inpatient Hospital Stay: Payer: BC Managed Care – PPO

## 2021-05-29 ENCOUNTER — Inpatient Hospital Stay: Payer: BC Managed Care – PPO | Attending: Physician Assistant

## 2021-05-29 ENCOUNTER — Other Ambulatory Visit: Payer: Self-pay

## 2021-05-29 ENCOUNTER — Inpatient Hospital Stay (HOSPITAL_BASED_OUTPATIENT_CLINIC_OR_DEPARTMENT_OTHER): Payer: BC Managed Care – PPO | Admitting: Physician Assistant

## 2021-05-29 VITALS — BP 152/94 | HR 73 | Temp 98.4°F | Resp 18 | Ht 62.6 in | Wt 220.5 lb

## 2021-05-29 DIAGNOSIS — I82532 Chronic embolism and thrombosis of left popliteal vein: Secondary | ICD-10-CM | POA: Diagnosis not present

## 2021-05-29 DIAGNOSIS — F17210 Nicotine dependence, cigarettes, uncomplicated: Secondary | ICD-10-CM | POA: Diagnosis not present

## 2021-05-29 DIAGNOSIS — Z7901 Long term (current) use of anticoagulants: Secondary | ICD-10-CM | POA: Diagnosis not present

## 2021-05-29 DIAGNOSIS — I2609 Other pulmonary embolism with acute cor pulmonale: Secondary | ICD-10-CM | POA: Diagnosis not present

## 2021-05-29 DIAGNOSIS — Z803 Family history of malignant neoplasm of breast: Secondary | ICD-10-CM | POA: Diagnosis not present

## 2021-05-29 DIAGNOSIS — R5383 Other fatigue: Secondary | ICD-10-CM | POA: Diagnosis not present

## 2021-05-29 DIAGNOSIS — I2699 Other pulmonary embolism without acute cor pulmonale: Secondary | ICD-10-CM | POA: Insufficient documentation

## 2021-05-29 DIAGNOSIS — D509 Iron deficiency anemia, unspecified: Secondary | ICD-10-CM | POA: Insufficient documentation

## 2021-05-29 DIAGNOSIS — I82402 Acute embolism and thrombosis of unspecified deep veins of left lower extremity: Secondary | ICD-10-CM | POA: Diagnosis not present

## 2021-05-29 DIAGNOSIS — Z7902 Long term (current) use of antithrombotics/antiplatelets: Secondary | ICD-10-CM | POA: Insufficient documentation

## 2021-05-29 DIAGNOSIS — D649 Anemia, unspecified: Secondary | ICD-10-CM

## 2021-05-29 LAB — CBC WITH DIFFERENTIAL (CANCER CENTER ONLY)
Abs Immature Granulocytes: 0.02 10*3/uL (ref 0.00–0.07)
Basophils Absolute: 0 10*3/uL (ref 0.0–0.1)
Basophils Relative: 0 %
Eosinophils Absolute: 0.1 10*3/uL (ref 0.0–0.5)
Eosinophils Relative: 1 %
HCT: 32.8 % — ABNORMAL LOW (ref 36.0–46.0)
Hemoglobin: 10.5 g/dL — ABNORMAL LOW (ref 12.0–15.0)
Immature Granulocytes: 0 %
Lymphocytes Relative: 31 %
Lymphs Abs: 2.2 10*3/uL (ref 0.7–4.0)
MCH: 30.3 pg (ref 26.0–34.0)
MCHC: 32 g/dL (ref 30.0–36.0)
MCV: 94.8 fL (ref 80.0–100.0)
Monocytes Absolute: 0.4 10*3/uL (ref 0.1–1.0)
Monocytes Relative: 6 %
Neutro Abs: 4.5 10*3/uL (ref 1.7–7.7)
Neutrophils Relative %: 62 %
Platelet Count: 337 10*3/uL (ref 150–400)
RBC: 3.46 MIL/uL — ABNORMAL LOW (ref 3.87–5.11)
RDW: 14.1 % (ref 11.5–15.5)
WBC Count: 7.2 10*3/uL (ref 4.0–10.5)
nRBC: 0 % (ref 0.0–0.2)

## 2021-05-29 LAB — FERRITIN: Ferritin: 11 ng/mL (ref 11–307)

## 2021-05-29 LAB — CMP (CANCER CENTER ONLY)
ALT: 10 U/L (ref 0–44)
AST: 17 U/L (ref 15–41)
Albumin: 3.8 g/dL (ref 3.5–5.0)
Alkaline Phosphatase: 40 U/L (ref 38–126)
Anion gap: 5 (ref 5–15)
BUN: 11 mg/dL (ref 6–20)
CO2: 25 mmol/L (ref 22–32)
Calcium: 8.7 mg/dL — ABNORMAL LOW (ref 8.9–10.3)
Chloride: 105 mmol/L (ref 98–111)
Creatinine: 0.93 mg/dL (ref 0.44–1.00)
GFR, Estimated: >60 mL/min (ref >60)
Glucose, Bld: 93 mg/dL (ref 70–99)
Potassium: 3.4 mmol/L — ABNORMAL LOW (ref 3.5–5.1)
Sodium: 135 mmol/L (ref 135–145)
Total Bilirubin: 0.5 mg/dL (ref 0.3–1.2)
Total Protein: 7.4 g/dL (ref 6.5–8.1)

## 2021-05-29 LAB — FOLATE: Folate: 9.9 ng/mL (ref >5.9)

## 2021-05-29 LAB — IRON AND IRON BINDING CAPACITY (CC-WL,HP ONLY)
Iron: 37 ug/dL (ref 28–170)
Saturation Ratios: 7 % — ABNORMAL LOW (ref 10.4–31.8)
TIBC: 532 ug/dL — ABNORMAL HIGH (ref 250–450)
UIBC: 495 ug/dL — ABNORMAL HIGH (ref 148–442)

## 2021-05-29 LAB — VITAMIN B12: Vitamin B-12: 536 pg/mL (ref 180–914)

## 2021-05-29 MED ORDER — FERROUS SULFATE 325 (65 FE) MG PO TBEC: 325.0000 mg | DELAYED_RELEASE_TABLET | Freq: Every day | ORAL | 3 refills | Status: DC

## 2021-05-29 NOTE — Progress Notes (Signed)
?Jessica Rodgers ?[phone]   [phone] ? ?PROGRESS NOTE ? ?Patient Care Team: ?Glendale Chard, MD as PCP - General (Internal Medicine) ?Buford Dresser, MD as PCP - Cardiology (Cardiology) ? ?Hematological/Oncological History ?1) 08/15/2020-08/18/2020: Admitted after presenting with increasing shortness of breath. CTA chest showed extensive bilateral pulmonary emboli with associated right heart strain. Doppler US revealed age indeterminate DVT involving the left popliteal vein and let peroneal veins.Treated with heparin drip and discharged home on Xarelto.  ? ?2) 10/28/2020: Establish care with Dede Query PA-C ? ?CHIEF COMPLAINTS/PURPOSE OF CONSULTATION:  ?Bilateral Pulmonary Emboli and Left LE DVT ?Iron deficiency anemia ? ?HISTORY OF PRESENTING ILLNESS:  ?Jessica Rodgers 46 y.o. female who presents to the clinic for a follow-up for history of pulmonary emboli and DVT along with iron deficiency anemia.  ? ?On exam today, Jessica Rodgers reports persistent fatigue but she is able to complete all her daily activities on her own. Her appetite is good with noted weight gain. She denies nausea, vomiting or abdomina pain. Her bowel habits are regular without any diarrhea or constipation. She denies easy bruising or active bleeding except for her menstrual bleeding. She reports that since she decreased dose of Xarelto to 10 mg/day, her menstrual bleeding has improved. Her menstrual cycles last 5 days with 2 days of heavy bleeding. She needs to change her pad/tampon every 3-4 hours. She stopped taking her iron pills after one month as she was unaware to continue her prescription. She denies fevers, chills, night sweats, shortness of breath, chest pain or cough. She has no other complaints. Rest of the 10 point ROS is below.  ? ?MEDICAL HISTORY:  ?Past Medical History:  ?Diagnosis Date  ? Gallstones   ? GERD (gastroesophageal reflux disease)   ? Hypertension   ? Pulmonary emboli ([place])    ? ? ?SURGICAL HISTORY: ?Past Surgical History:  ?Procedure Laterality Date  ? BREAST EXCISIONAL BIOPSY Right 04/07/2017  ? [place]  ? RADIOACTIVE SEED GUIDED EXCISIONAL BREAST BIOPSY Right 04/07/2017  ? Procedure: RIGHT RADIOACTIVE SEED GUIDED EXCISIONAL BREAST BIOPSY;  Surgeon: Rolm Bookbinder, MD;  Location: [place];  Service: General;  Laterality: Right;  ? TUBAL LIGATION  1997  ? ? ?SOCIAL HISTORY: ?Social History  ? ?Socioeconomic History  ? Marital status: Divorced  ?  Spouse name: Not on file  ? Number of children: Not on file  ? Years of education: Not on file  ? Highest education level: Not on file  ?Occupational History  ? Not on file  ?Tobacco Use  ? Smoking status: Former  ?  Packs/day: 0.50  ?  Years: 18.00  ?  Pack years: 9.00  ?  Types: Cigarettes  ?  Start date: 02/13/2003  ?  Quit date: 08/09/2020  ?  Years since quitting: 0.8  ? Smokeless tobacco: Never  ?Vaping Use  ? Vaping Use: Never used  ?Substance and Sexual Activity  ? Alcohol use: Yes  ?  Alcohol/week: 4.0 standard drinks  ?  Types: 3 Glasses of wine, 1 Standard drinks or equivalent per week  ? Drug use: No  ? Sexual activity: Not on file  ?Other Topics Concern  ? Not on file  ?Social History Narrative  ? Not on file  ? ?Social Determinants of Health  ? ?Financial Resource Strain: Not on file  ?Food Insecurity: Not on file  ?Transportation Needs: Not on file  ?Physical Activity: Not on file  ?Stress: Not on file  ?Social Connections: Not  on file  ?Intimate Partner Violence: Not on file  ? ? ?FAMILY HISTORY: ?Family History  ?Problem Relation Age of Onset  ? Hypertension Mother   ? Arthritis Mother   ? Glaucoma Mother   ? Other Father   ?     unsure of his health  ? Breast cancer Maternal Grandmother 35  ? ? ?ALLERGIES:  has No Known Allergies. ? ?MEDICATIONS:  ?Current Outpatient Medications  ?Medication Sig Dispense Refill  ? Ascorbic Acid (VITAMIN C) 1000 MG tablet Take 1,000 mg by mouth daily. As needed    ?  Cholecalciferol (VITAMIN D PO) Take 1 tablet by mouth daily. 5,000u    ? ELDERBERRY PO Take 1 tablet by mouth daily. As needed    ? rivaroxaban (XARELTO) 10 MG TABS tablet Take 1 tablet (10 mg total) by mouth daily. 90 tablet 1  ? Semaglutide (RYBELSUS) 7 MG TABS Take 7 mg by mouth daily. 90 tablet 1  ? valsartan-hydrochlorothiazide (DIOVAN-HCT) 80-12.5 MG tablet TAKE 1 TABLET BY MOUTH EVERY DAY 90 tablet 1  ? ferrous sulfate 325 (65 FE) MG EC tablet Take 1 tablet (325 mg total) by mouth daily with breakfast. (Patient not taking: Reported on 05/29/2021) 30 tablet 3  ? Semaglutide-Weight Management (WEGOVY) 0.5 MG/0.5ML SOAJ Inject 0.5 mg into the skin once a week. (Patient not taking: Reported on 05/29/2021) 2 mL 0  ? ?No current facility-administered medications for this visit.  ? ? ?REVIEW OF SYSTEMS:   ?Constitutional: ( - ) fevers, ( - )  chills , ( - ) night sweats ?Eyes: ( - ) blurriness of vision, ( - ) double vision, ( - ) watery eyes ?Ears, nose, mouth, throat, and face: ( - ) mucositis, ( - ) sore throat ?Respiratory: ( - ) cough, ( - ) dyspnea, ( - ) wheezes ?Cardiovascular: ( - ) palpitation, ( - ) chest discomfort, ( - ) lower extremity swelling ?Gastrointestinal:  ( - ) nausea, ( - ) heartburn, ( - ) change in bowel habits ?Skin: ( - ) abnormal skin rashes ?Lymphatics: ( - ) new lymphadenopathy, ( - ) easy bruising ?Neurological: ( - ) numbness, ( - ) tingling, ( - ) new weaknesses ?Behavioral/Psych: ( - ) mood change, ( - ) new changes  ?All other systems were reviewed with the patient and are negative. ? ?PHYSICAL EXAMINATION: ?ECOG PERFORMANCE STATUS: 1 - Symptomatic but completely ambulatory ? ?Vitals:  ? 05/29/21 0918  ?BP: (!) 152/94  ?Pulse: 73  ?Resp: 18  ?Temp: 98.4 ?F (36.9 ?C)  ?SpO2: 100%  ? ?Filed Weights  ? 05/29/21 0918  ?Weight: 220 lb 8 oz (100 kg)  ? ? ?GENERAL: well appearing female in NAD  ?SKIN: skin color, texture, turgor are normal, no rashes or significant lesions ?EYES:  conjunctiva are pink and non-injected, sclera clear ?NECK: supple, non-tender ?LYMPH:  no palpable lymphadenopathy in the cervical or supraclavicular lymph nodes.  ?LUNGS: clear to auscultation and percussion with normal breathing effort ?HEART: regular rate & rhythm and no murmurs and no lower extremity edema ?Musculoskeletal: no cyanosis of digits and no clubbing  ?PSYCH: alert & oriented x 3, fluent speech ?NEURO: no focal motor/sensory deficits ? ?LABORATORY DATA:  ?I have reviewed the data as listed ?CBC Latest Ref Rng & Units 05/29/2021 02/27/2021 10/28/2020  ?WBC 4.0 - 10.5 K/uL 7.2 6.6 5.9  ?Hemoglobin 12.0 - 15.0 g/dL 10.5(L) 10.8(L) 12.6  ?Hematocrit 36.0 - 46.0 % 32.8(L) 33.6(L) 36.8  ?Platelets 150 - 400  K/uL 337 332 283  ? ? ?CMP Latest Ref Rng & Units 05/29/2021 02/27/2021 01/22/2021  ?Glucose 70 - 99 mg/dL 93 93 89  ?BUN 6 - 20 mg/dL '11 15 11  '$ ?Creatinine 0.44 - 1.00 mg/dL 0.93 0.91 0.91  ?Sodium 135 - 145 mmol/L 135 136 139  ?Potassium 3.5 - 5.1 mmol/L 3.4(L) 4.2 4.4  ?Chloride 98 - 111 mmol/L 105 107 105  ?CO2 22 - 32 mmol/L '25 22 23  '$ ?Calcium 8.9 - 10.3 mg/dL 8.7(L) 8.8(L) 9.3  ?Total Protein 6.5 - 8.1 g/dL 7.4 7.4 -  ?Total Bilirubin 0.3 - 1.2 mg/dL 0.5 0.3 -  ?Alkaline Phos 38 - 126 U/L 40 46 -  ?AST 15 - 41 U/L 17 19 -  ?ALT 0 - 44 U/L 10 11 -  ? ?RADIOGRAPHIC STUDIES: ?I have personally reviewed the radiological images as listed and agreed with the findings in the report. ? ?08/15/2020: CTA Chest: ?1. Extensive bilateral pulmonary embolism with associated right ?heart strain. ?2. Cholelithiasis. ? ?08/16/2020: [place] Bilateral LE: ?Findings consistent with age indeterminate deep vein thrombosis  ?involving the left popliteal vein, and left peroneal veins.  ? ?ASSESSMENT & PLAN ?Jessica Rodgers is a 46 y.o. female for a follow up for bilateral extensive pulmonary emboli and left lower extremity DVT that was diagnosed in June 2022.  ? ?#Extensive bilateral pulmonary emboli and LLE DVT: ?-CTA  chest from 08/15/2020 showed extensive bilateral pulmonary embolism with associated right heart strain. Doppler US on 08/16/2020 showed DVT involving the left popliteal vein and left peroneal veins.  ?--Risk factors include smoki

## 2021-06-01 ENCOUNTER — Telehealth: Payer: Self-pay | Admitting: Physician Assistant

## 2021-06-01 NOTE — Telephone Encounter (Signed)
Scheduled per 3/17 los, pt has been called and confirmed appt ?

## 2021-06-02 ENCOUNTER — Other Ambulatory Visit: Payer: Self-pay | Admitting: Internal Medicine

## 2021-07-08 LAB — HM PAP SMEAR: HM Pap smear: NORMAL

## 2021-08-07 ENCOUNTER — Telehealth: Payer: Self-pay | Admitting: Physician Assistant

## 2021-08-07 NOTE — Telephone Encounter (Signed)
Scheduled per provider pal, pt has been called and confirmed appt change

## 2021-08-18 ENCOUNTER — Encounter: Payer: Self-pay | Admitting: Internal Medicine

## 2021-08-18 ENCOUNTER — Ambulatory Visit (INDEPENDENT_AMBULATORY_CARE_PROVIDER_SITE_OTHER): Payer: BC Managed Care – PPO | Admitting: Internal Medicine

## 2021-08-18 VITALS — BP 132/80 | HR 77 | Temp 98.6°F | Ht 62.6 in | Wt 216.0 lb

## 2021-08-18 DIAGNOSIS — Z6838 Body mass index (BMI) 38.0-38.9, adult: Secondary | ICD-10-CM

## 2021-08-18 DIAGNOSIS — I10 Essential (primary) hypertension: Secondary | ICD-10-CM

## 2021-08-18 LAB — BMP8+EGFR
BUN/Creatinine Ratio: 18 (ref 9–23)
BUN: 16 mg/dL (ref 6–24)
CO2: 22 mmol/L (ref 20–29)
Calcium: 9.4 mg/dL (ref 8.7–10.2)
Chloride: 106 mmol/L (ref 96–106)
Creatinine, Ser: 0.89 mg/dL (ref 0.57–1.00)
Glucose: 97 mg/dL (ref 70–99)
Potassium: 4.9 mmol/L (ref 3.5–5.2)
Sodium: 141 mmol/L (ref 134–144)
eGFR: 80 mL/min/{1.73_m2} (ref >59)

## 2021-08-18 MED ORDER — RIVAROXABAN 10 MG PO TABS: 10.0000 mg | ORAL_TABLET | Freq: Every day | ORAL | 1 refills | Status: DC

## 2021-08-18 NOTE — Patient Instructions (Signed)
Hypertension, Adult ?Hypertension is another name for high blood pressure. High blood pressure forces your heart to work harder to pump blood. This can cause problems over time. ?There are two numbers in a blood pressure reading. There is a top number (systolic) over a bottom number (diastolic). It is best to have a blood pressure that is below 120/80. ?What are the causes? ?The cause of this condition is not known. Some other conditions can lead to high blood pressure. ?What increases the risk? ?Some lifestyle factors can make you more likely to develop high blood pressure: ?Smoking. ?Not getting enough exercise or physical activity. ?Being overweight. ?Having too much fat, sugar, calories, or salt (sodium) in your diet. ?Drinking too much alcohol. ?Other risk factors include: ?Having any of these conditions: ?Heart disease. ?Diabetes. ?High cholesterol. ?Kidney disease. ?Obstructive sleep apnea. ?Having a family history of high blood pressure and high cholesterol. ?Age. The risk increases with age. ?Stress. ?What are the signs or symptoms? ?High blood pressure may not cause symptoms. Very high blood pressure (hypertensive crisis) may cause: ?Headache. ?Fast or uneven heartbeats (palpitations). ?Shortness of breath. ?Nosebleed. ?Vomiting or feeling like you may vomit (nauseous). ?Changes in how you see. ?Very bad chest pain. ?Feeling dizzy. ?Seizures. ?How is this treated? ?This condition is treated by making healthy lifestyle changes, such as: ?Eating healthy foods. ?Exercising more. ?Drinking less alcohol. ?Your doctor may prescribe medicine if lifestyle changes do not help enough and if: ?Your top number is above 130. ?Your bottom number is above 80. ?Your personal target blood pressure may vary. ?Follow these instructions at home: ?Eating and drinking ? ?If told, follow the DASH eating plan. To follow this plan: ?Fill one half of your plate at each meal with fruits and vegetables. ?Fill one fourth of your plate  at each meal with whole grains. Whole grains include whole-wheat pasta, brown rice, and whole-grain bread. ?Eat or drink low-fat dairy products, such as skim milk or low-fat yogurt. ?Fill one fourth of your plate at each meal with low-fat (lean) proteins. Low-fat proteins include fish, chicken without skin, eggs, beans, and tofu. ?Avoid fatty meat, cured and processed meat, or chicken with skin. ?Avoid pre-made or processed food. ?Limit the amount of salt in your diet to less than 1,500 mg each day. ?Do not drink alcohol if: ?Your doctor tells you not to drink. ?You are pregnant, may be pregnant, or are planning to become pregnant. ?If you drink alcohol: ?Limit how much you have to: ?0-1 drink a day for women. ?0-2 drinks a day for men. ?Know how much alcohol is in your drink. In the [place] one drink equals one 12 oz bottle of beer (355 mL), one 5 oz glass of wine (148 mL), or one 1? oz glass of hard liquor (44 mL). ?Lifestyle ? ?Work with your doctor to stay at a healthy weight or to lose weight. Ask your doctor what the best weight is for you. ?Get at least 30 minutes of exercise that causes your heart to beat faster (aerobic exercise) most days of the week. This may include walking, swimming, or biking. ?Get at least 30 minutes of exercise that strengthens your muscles (resistance exercise) at least 3 days a week. This may include lifting weights or doing Pilates. ?Do not smoke or use any products that contain nicotine or tobacco. If you need help quitting, ask your doctor. ?Check your blood pressure at home as told by your doctor. ?Keep all follow-up visits. ?Medicines ?Take over-the-counter and prescription medicines   only as told by your doctor. Follow directions carefully. ?Do not skip doses of blood pressure medicine. The medicine does not work as well if you skip doses. Skipping doses also puts you at risk for problems. ?Ask your doctor about side effects or reactions to medicines that you should watch  for. ?Contact a doctor if: ?You think you are having a reaction to the medicine you are taking. ?You have headaches that keep coming back. ?You feel dizzy. ?You have swelling in your ankles. ?You have trouble with your vision. ?Get help right away if: ?You get a very bad headache. ?You start to feel mixed up (confused). ?You feel weak or numb. ?You feel faint. ?You have very bad pain in your: ?Chest. ?Belly (abdomen). ?You vomit more than once. ?You have trouble breathing. ?These symptoms may be an emergency. Get help right away. Call 911. ?Do not wait to see if the symptoms will go away. ?Do not drive yourself to the hospital. ?Summary ?Hypertension is another name for high blood pressure. ?High blood pressure forces your heart to work harder to pump blood. ?For most people, a normal blood pressure is less than 120/80. ?Making healthy choices can help lower blood pressure. If your blood pressure does not get lower with healthy choices, you may need to take medicine. ?This information is not intended to replace advice given to you by your health care provider. Make sure you discuss any questions you have with your health care provider. ?Document Revised: 12/18/2020 Document Reviewed: 12/18/2020 ?Elsevier Patient Education ? 2023 [employer] Inc[employer] ? ?

## 2021-08-18 NOTE — Progress Notes (Signed)
Rich Brave Llittleton,acting as a Education administrator for Maximino Greenland, MD.,have documented all relevant documentation on the behalf of Maximino Greenland, MD,as directed by  Maximino Greenland, MD while in the presence of Maximino Greenland, MD.  This visit occurred during the SARS-CoV-2 public health emergency.  Safety protocols were in place, including screening questions prior to the visit, additional usage of staff PPE, and extensive cleaning of exam room while observing appropriate contact time as indicated for disinfecting solutions.  Subjective:     Patient ID: Jessica Rodgers , female    DOB: 31-Aug-1975 , 46 y.o.   MRN: [id number]   Chief Complaint  Patient presents with   Hypertension    HPI  The patient is here today for bp check. She reports compliance with meds. She is currently taking valsartan/hct for HTN. She denies headaches, chest pain and shortness of breath.   Hypertension This is a chronic problem. The current episode started more than 1 year ago. The problem has been gradually improving since onset. The problem is controlled. Pertinent negatives include no palpitations or shortness of breath. Past treatments include ACE inhibitors, angiotensin blockers and diuretics. The current treatment provides moderate improvement.    Past Medical History:  Diagnosis Date   Gallstones    GERD (gastroesophageal reflux disease)    Hypertension    Pulmonary emboli ([place])      Family History  Problem Relation Age of Onset   Hypertension Mother    Arthritis Mother    Glaucoma Mother    Other Father        unsure of his health   Breast cancer Maternal Grandmother 26     Current Outpatient Medications:    Cholecalciferol (VITAMIN D PO), Take 1 tablet by mouth daily. 5,000u, Disp: , Rfl:    ferrous sulfate 325 (65 FE) MG EC tablet, Take 1 tablet (325 mg total) by mouth daily with breakfast., Disp: 30 tablet, Rfl: 3   valsartan-hydrochlorothiazide (DIOVAN-HCT) 80-12.5 MG tablet, TAKE 1  TABLET BY MOUTH EVERY DAY, Disp: 90 tablet, Rfl: 1   Ascorbic Acid (VITAMIN C) 1000 MG tablet, Take 1,000 mg by mouth daily. As needed (Patient not taking: Reported on 08/18/2021), Disp: , Rfl:    ELDERBERRY PO, Take 1 tablet by mouth daily. As needed (Patient not taking: Reported on 08/18/2021), Disp: , Rfl:    rivaroxaban (XARELTO) 10 MG TABS tablet, Take 1 tablet (10 mg total) by mouth daily., Disp: 90 tablet, Rfl: 1   No Known Allergies   Review of Systems  Constitutional: Negative.   Respiratory: Negative.  Negative for shortness of breath.   Cardiovascular: Negative.  Negative for palpitations.  Gastrointestinal: Negative.   Neurological: Negative.   Psychiatric/Behavioral: Negative.      Today's Vitals   08/18/21 0950  BP: 132/80  Pulse: 77  Temp: 98.6 F (37 C)  Weight: 216 lb (98 kg)  Height: 5' 2.6" (1.59 m)  PainSc: 0-No pain   Body mass index is 38.75 kg/m.  Wt Readings from Last 3 Encounters:  08/18/21 216 lb (98 kg)  05/29/21 220 lb 8 oz (100 kg)  05/05/21 215 lb 3.2 oz (97.6 kg)    Objective:  Physical Exam Vitals and nursing note reviewed.  Constitutional:      Appearance: Normal appearance. She is obese.  HENT:     Head: Normocephalic and atraumatic.  Eyes:     Extraocular Movements: Extraocular movements intact.  Cardiovascular:     Rate and Rhythm:  Normal rate and regular rhythm.     Heart sounds: Normal heart sounds.  Pulmonary:     Effort: Pulmonary effort is normal.     Breath sounds: Normal breath sounds.  Musculoskeletal:     Cervical back: Normal range of motion.  Skin:    General: Skin is warm.  Neurological:     General: No focal deficit present.     Mental Status: She is alert.  Psychiatric:        Mood and Affect: Mood normal.        Behavior: Behavior normal.        Assessment And Plan:     1. Essential hypertension Comments: Chronic, fair control. Goal BP<130/80.  She will c/w valsartan/hct 80/12.5mg  daily. I will check renal  fxn today. F/u 4-6 months.  - BMP8+EGFR  2. Class 2 severe obesity due to excess calories with serious comorbidity and body mass index (BMI) of 38.0 to 38.9 in adult [facility]) Comments: She has lost 4lbs since last visit. She does not wish to take adjunctive meds to assist w/ weight loss efforts at this time. Advised to exercise 150 min/week.   Patient was given opportunity to ask questions. Patient verbalized understanding of the plan and was able to repeat key elements of the plan. All questions were answered to their satisfaction.   I, Maximino Greenland, MD, have reviewed all documentation for this visit. The documentation on 08/18/21 for the exam, diagnosis, procedures, and orders are all accurate and complete.   IF YOU HAVE BEEN REFERRED TO A SPECIALIST, IT MAY TAKE 1-2 WEEKS TO SCHEDULE/PROCESS THE REFERRAL. IF YOU HAVE NOT HEARD FROM US/SPECIALIST IN TWO WEEKS, PLEASE GIVE [place] A CALL AT [phone].   THE PATIENT IS ENCOURAGED TO PRACTICE SOCIAL DISTANCING DUE TO THE COVID-19 PANDEMIC.

## 2021-09-01 ENCOUNTER — Telehealth: Payer: Self-pay | Admitting: Hematology and Oncology

## 2021-09-01 ENCOUNTER — Inpatient Hospital Stay (HOSPITAL_BASED_OUTPATIENT_CLINIC_OR_DEPARTMENT_OTHER): Payer: BC Managed Care – PPO | Admitting: Hematology and Oncology

## 2021-09-01 ENCOUNTER — Inpatient Hospital Stay: Payer: BC Managed Care – PPO | Attending: Physician Assistant

## 2021-09-01 ENCOUNTER — Other Ambulatory Visit: Payer: Self-pay

## 2021-09-01 ENCOUNTER — Other Ambulatory Visit: Payer: Self-pay | Admitting: Hematology and Oncology

## 2021-09-01 ENCOUNTER — Ambulatory Visit: Payer: BC Managed Care – PPO | Admitting: Physician Assistant

## 2021-09-01 ENCOUNTER — Other Ambulatory Visit: Payer: BC Managed Care – PPO

## 2021-09-01 VITALS — BP 132/85 | HR 67 | Temp 98.0°F | Resp 17 | Ht 62.6 in | Wt 217.5 lb

## 2021-09-01 DIAGNOSIS — Z86711 Personal history of pulmonary embolism: Secondary | ICD-10-CM | POA: Diagnosis present

## 2021-09-01 DIAGNOSIS — Z86718 Personal history of other venous thrombosis and embolism: Secondary | ICD-10-CM | POA: Insufficient documentation

## 2021-09-01 DIAGNOSIS — D649 Anemia, unspecified: Secondary | ICD-10-CM

## 2021-09-01 DIAGNOSIS — I82532 Chronic embolism and thrombosis of left popliteal vein: Secondary | ICD-10-CM | POA: Diagnosis not present

## 2021-09-01 DIAGNOSIS — D509 Iron deficiency anemia, unspecified: Secondary | ICD-10-CM | POA: Insufficient documentation

## 2021-09-01 DIAGNOSIS — Z7901 Long term (current) use of anticoagulants: Secondary | ICD-10-CM | POA: Insufficient documentation

## 2021-09-01 LAB — CMP (CANCER CENTER ONLY)
ALT: 14 U/L (ref 0–44)
AST: 20 U/L (ref 15–41)
Albumin: 3.9 g/dL (ref 3.5–5.0)
Alkaline Phosphatase: 43 U/L (ref 38–126)
Anion gap: 5 (ref 5–15)
BUN: 13 mg/dL (ref 6–20)
CO2: 24 mmol/L (ref 22–32)
Calcium: 9.5 mg/dL (ref 8.9–10.3)
Chloride: 107 mmol/L (ref 98–111)
Creatinine: 0.97 mg/dL (ref 0.44–1.00)
GFR, Estimated: >60 mL/min (ref >60)
Glucose, Bld: 101 mg/dL — ABNORMAL HIGH (ref 70–99)
Potassium: 4.5 mmol/L (ref 3.5–5.1)
Sodium: 136 mmol/L (ref 135–145)
Total Bilirubin: 0.3 mg/dL (ref 0.3–1.2)
Total Protein: 7.6 g/dL (ref 6.5–8.1)

## 2021-09-01 LAB — RETIC PANEL
Immature Retic Fract: 17.3 % — ABNORMAL HIGH (ref 2.3–15.9)
RBC.: 3.71 MIL/uL — ABNORMAL LOW (ref 3.87–5.11)
Retic Count, Absolute: 101.7 10*3/uL (ref 19.0–186.0)
Retic Ct Pct: 2.7 % (ref 0.4–3.1)
Reticulocyte Hemoglobin: 33.7 pg (ref >27.9)

## 2021-09-01 LAB — CBC WITH DIFFERENTIAL (CANCER CENTER ONLY)
Abs Immature Granulocytes: 0.02 10*3/uL (ref 0.00–0.07)
Basophils Absolute: 0 10*3/uL (ref 0.0–0.1)
Basophils Relative: 0 %
Eosinophils Absolute: 0.1 10*3/uL (ref 0.0–0.5)
Eosinophils Relative: 1 %
HCT: 35.3 % — ABNORMAL LOW (ref 36.0–46.0)
Hemoglobin: 11.6 g/dL — ABNORMAL LOW (ref 12.0–15.0)
Immature Granulocytes: 0 %
Lymphocytes Relative: 28 %
Lymphs Abs: 2 10*3/uL (ref 0.7–4.0)
MCH: 30.9 pg (ref 26.0–34.0)
MCHC: 32.9 g/dL (ref 30.0–36.0)
MCV: 94.1 fL (ref 80.0–100.0)
Monocytes Absolute: 0.5 10*3/uL (ref 0.1–1.0)
Monocytes Relative: 7 %
Neutro Abs: 4.3 10*3/uL (ref 1.7–7.7)
Neutrophils Relative %: 64 %
Platelet Count: 312 10*3/uL (ref 150–400)
RBC: 3.75 MIL/uL — ABNORMAL LOW (ref 3.87–5.11)
RDW: 14 % (ref 11.5–15.5)
WBC Count: 6.9 10*3/uL (ref 4.0–10.5)
nRBC: 0 % (ref 0.0–0.2)

## 2021-09-01 LAB — IRON AND IRON BINDING CAPACITY (CC-WL,HP ONLY)
Iron: 42 ug/dL (ref 28–170)
Saturation Ratios: 8 % — ABNORMAL LOW (ref 10.4–31.8)
TIBC: 526 ug/dL — ABNORMAL HIGH (ref 250–450)
UIBC: 484 ug/dL — ABNORMAL HIGH (ref 148–442)

## 2021-09-01 LAB — FERRITIN: Ferritin: 7 ng/mL — ABNORMAL LOW (ref 11–307)

## 2021-09-01 NOTE — Progress Notes (Signed)
[facility] [phone]   [phone]  PROGRESS NOTE  Patient Care Team: Glendale Chard, MD as PCP - General (Internal Medicine) Buford Dresser, MD as PCP - Cardiology (Cardiology)  Hematological/Oncological History 1) 08/15/2020-08/18/2020: Admitted after presenting with increasing shortness of breath. CTA chest showed extensive bilateral pulmonary emboli with associated right heart strain. Doppler US revealed age indeterminate DVT involving the left popliteal vein and let peroneal veins.Treated with heparin drip and discharged home on Xarelto.   2) 10/28/2020: Establish care with Dede Query PA-C  CHIEF COMPLAINTS/PURPOSE OF CONSULTATION:  Bilateral Pulmonary Emboli and Left LE DVT Iron deficiency anemia  HISTORY OF PRESENTING ILLNESS:  Jessica Rodgers 46 y.o. female who presents to the clinic for a follow-up for history of pulmonary emboli and DVT along with iron deficiency anemia.   On exam today, Jessica Rodgers reports she has been taking the iron pills "correctly" in the interim since her last visit.  She takes them in the morning with orange juice.  She notes it is not causing any stomach upset, constipation, or diarrhea.  She notes that her energy levels are so-so but she is not as "groggy feeling".  She reports her energy is currently an 8 out of 10.  She continues to take Xarelto pills without difficulty.  She is not having any bleeding or bruising.  She notes that she is not having any signs or symptoms concerning for recurrent VTE.  She does of the medication is costing about $90 per months but she recently signed up for discount card and will be getting a refill here shortly.  She notes he is not having any chest pain or shortness of breath.  Her menstrual cycles have been "not as crazy".  And with the lower dose appears she is not bleeding as heavily.  Overall she reports her menstrual cycles are "leveling out".  She denies fevers, chills,  night sweats, shortness of breath, chest pain or cough. She has no other complaints. Rest of the 10 point ROS is below.   MEDICAL HISTORY:  Past Medical History:  Diagnosis Date   Gallstones    GERD (gastroesophageal reflux disease)    Hypertension    Pulmonary emboli (HCC)     SURGICAL HISTORY: Past Surgical History:  Procedure Laterality Date   BREAST EXCISIONAL BIOPSY Right 04/07/2017   [facility]   RADIOACTIVE SEED GUIDED EXCISIONAL BREAST BIOPSY Right 04/07/2017   Procedure: RIGHT RADIOACTIVE SEED GUIDED EXCISIONAL BREAST BIOPSY;  Surgeon: Rolm Bookbinder, MD;  Location: [facility];  Service: General;  Laterality: Right;   TUBAL LIGATION  1997    SOCIAL HISTORY: Social History   Socioeconomic History   Marital status: Divorced    Spouse name: Not on file   Number of children: Not on file   Years of education: Not on file   Highest education level: Not on file  Occupational History   Not on file  Tobacco Use   Smoking status: Former    Packs/day: 0.50    Years: 18.00    Total pack years: 9.00    Types: Cigarettes    Start date: 02/13/2003    Quit date: 08/09/2020    Years since quitting: 1.0   Smokeless tobacco: Never  Vaping Use   Vaping Use: Never used  Substance and Sexual Activity   Alcohol use: Yes    Alcohol/week: 4.0 standard drinks of alcohol    Types: 3 Glasses of wine, 1 Standard drinks or equivalent per week  Drug use: No   Sexual activity: Not on file  Other Topics Concern   Not on file  Social History Narrative   Not on file   Social Determinants of Health   Financial Resource Strain: Not on file  Food Insecurity: Not on file  Transportation Needs: Not on file  Physical Activity: Not on file  Stress: Not on file  Social Connections: Not on file  Intimate Partner Violence: Not on file    FAMILY HISTORY: Family History  Problem Relation Age of Onset   Hypertension Mother    Arthritis Mother    Glaucoma Mother    Other  Father        unsure of his health   Breast cancer Maternal Grandmother 8    ALLERGIES:  has No Known Allergies.  MEDICATIONS:  Current Outpatient Medications  Medication Sig Dispense Refill   Ascorbic Acid (VITAMIN C) 1000 MG tablet Take 1,000 mg by mouth daily. As needed     Cholecalciferol (VITAMIN D PO) Take 1 tablet by mouth daily. 5,000u     ELDERBERRY PO Take 1 tablet by mouth daily. As needed     ferrous sulfate 325 (65 FE) MG EC tablet Take 1 tablet (325 mg total) by mouth daily with breakfast. 30 tablet 3   rivaroxaban (XARELTO) 10 MG TABS tablet Take 1 tablet (10 mg total) by mouth daily. 90 tablet 1   valsartan-hydrochlorothiazide (DIOVAN-HCT) 80-12.5 MG tablet TAKE 1 TABLET BY MOUTH EVERY DAY 90 tablet 1   No current facility-administered medications for this visit.    REVIEW OF SYSTEMS:   Constitutional: ( - ) fevers, ( - )  chills , ( - ) night sweats Eyes: ( - ) blurriness of vision, ( - ) double vision, ( - ) watery eyes Ears, nose, mouth, throat, and face: ( - ) mucositis, ( - ) sore throat Respiratory: ( - ) cough, ( - ) dyspnea, ( - ) wheezes Cardiovascular: ( - ) palpitation, ( - ) chest discomfort, ( - ) lower extremity swelling Gastrointestinal:  ( - ) nausea, ( - ) heartburn, ( - ) change in bowel habits Skin: ( - ) abnormal skin rashes Lymphatics: ( - ) new lymphadenopathy, ( - ) easy bruising Neurological: ( - ) numbness, ( - ) tingling, ( - ) new weaknesses Behavioral/Psych: ( - ) mood change, ( - ) new changes  All other systems were reviewed with the patient and are negative.  PHYSICAL EXAMINATION: ECOG PERFORMANCE STATUS: 1 - Symptomatic but completely ambulatory  Vitals:   09/01/21 0852  BP: 132/85  Pulse: 67  Resp: 17  Temp: 98 F (36.7 C)  SpO2: 100%   Filed Weights   09/01/21 0852  Weight: 217 lb 8 oz (98.7 kg)    GENERAL: well appearing female in NAD  SKIN: skin color, texture, turgor are normal, no rashes or significant  lesions EYES: conjunctiva are pink and non-injected, sclera clear LUNGS: clear to auscultation and percussion with normal breathing effort HEART: regular rate & rhythm and no murmurs and no lower extremity edema Musculoskeletal: no cyanosis of digits and no clubbing  PSYCH: alert & oriented x 3, fluent speech NEURO: no focal motor/sensory deficits  LABORATORY DATA:  I have reviewed the data as listed    Latest Ref Rng & Units 09/01/2021    8:38 AM 05/29/2021    9:03 AM 02/27/2021    8:03 AM  CBC  WBC 4.0 - 10.5 K/uL 6.9  7.2  6.6   Hemoglobin 12.0 - 15.0 g/dL 11.6  10.5  10.8   Hematocrit 36.0 - 46.0 % 35.3  32.8  33.6   Platelets 150 - 400 K/uL 312  337  332        Latest Ref Rng & Units 09/01/2021    8:38 AM 08/18/2021   10:40 AM 05/29/2021    9:03 AM  CMP  Glucose 70 - 99 mg/dL 101  97  93   BUN 6 - 20 mg/dL '13  16  11   '$ Creatinine 0.44 - 1.00 mg/dL 0.97  0.89  0.93   Sodium 135 - 145 mmol/L 136  141  135   Potassium 3.5 - 5.1 mmol/L 4.5  4.9  3.4   Chloride 98 - 111 mmol/L 107  106  105   CO2 22 - 32 mmol/L '24  22  25   '$ Calcium 8.9 - 10.3 mg/dL 9.5  9.4  8.7   Total Protein 6.5 - 8.1 g/dL 7.6   7.4   Total Bilirubin 0.3 - 1.2 mg/dL 0.3   0.5   Alkaline Phos 38 - 126 U/L 43   40   AST 15 - 41 U/L 20   17   ALT 0 - 44 U/L 14   10    RADIOGRAPHIC STUDIES: I have personally reviewed the radiological images as listed and agreed with the findings in the report.  08/15/2020: CTA Chest: 1. Extensive bilateral pulmonary embolism with associated right heart strain. 2. Cholelithiasis.  08/16/2020: [place] Bilateral LE: Findings consistent with age indeterminate deep vein thrombosis  involving the left popliteal vein, and left peroneal veins.   ASSESSMENT & PLAN Jessica Rodgers is a 46 y.o. female for a follow up for bilateral extensive pulmonary emboli and left lower extremity DVT that was diagnosed in June 2022.   #Extensive bilateral pulmonary emboli and LLE  DVT: -CTA chest from 08/15/2020 showed extensive bilateral pulmonary embolism with associated right heart strain. Doppler US on 08/16/2020 showed DVT involving the left popliteal vein and left peroneal veins.  --Risk factors include smoking and sedentary lifestyle. Patient continues to smoke occasionally and sits for long periods of time as she works from home.  --Hypercoagulable workup was negative for any clotting disorders.  --After six months of anticoagulation, we discussed risks versus benefits of continuing on Xarelto. Since patient reports that she has a sedentary lifestyle and smokes intermittently, we decided to continue on anticoagulation but transition to maintenance dose.  --continue with maintenance dose of Xarelto 10 mg once daily  #Iron deficiency anemia 2/2 GYN bleeding: --Labs today show persistent anemia with white blood cell count 6.9, hemoglobin 1.6, MCV 94.1, and platelet count of 312.  Additionally she has an MCV of 94.1.  --Recommended to continue ferrous sulfate 325 mg once a day with a source of vitamin C --gave list of iron rich foods to incorporate into diet.  -- if iron levels remain low on today's labs will pursue IV iron therapy.   Follow up: --3 months with labs No orders of the defined types were placed in this encounter.  All questions were answered. The patient knows to call the clinic with any problems, questions or concerns.  I have spent a total of 30 minutes minutes of face-to-face and non-face-to-face time, preparing to see the [place] a medically appropriate examination, counseling and educating the patient, ordering tests/meds, documenting clinical information in the electronic health record, and care coordination.    Ledell Peoples,  MD Department of Hematology/Oncology [place] [facility] Phone: [phone] Pager: [phone] Email: [email or web address]

## 2021-09-01 NOTE — Telephone Encounter (Signed)
Per 6/20 los called and spoke to pt about appointment in 3 months pt confirmed appointment

## 2021-10-20 ENCOUNTER — Ambulatory Visit (INDEPENDENT_AMBULATORY_CARE_PROVIDER_SITE_OTHER): Payer: BC Managed Care – PPO | Admitting: Internal Medicine

## 2021-10-20 ENCOUNTER — Encounter: Payer: Self-pay | Admitting: Internal Medicine

## 2021-10-20 VITALS — BP 124/80 | HR 84 | Temp 98.1°F | Ht 63.2 in | Wt 214.8 lb

## 2021-10-20 DIAGNOSIS — N6311 Unspecified lump in the right breast, upper outer quadrant: Secondary | ICD-10-CM

## 2021-10-20 DIAGNOSIS — I10 Essential (primary) hypertension: Secondary | ICD-10-CM | POA: Diagnosis not present

## 2021-10-20 DIAGNOSIS — Z0000 Encounter for general adult medical examination without abnormal findings: Secondary | ICD-10-CM | POA: Diagnosis not present

## 2021-10-20 DIAGNOSIS — M7661 Achilles tendinitis, right leg: Secondary | ICD-10-CM | POA: Diagnosis not present

## 2021-10-20 DIAGNOSIS — Z6837 Body mass index (BMI) 37.0-37.9, adult: Secondary | ICD-10-CM

## 2021-10-20 LAB — POCT URINALYSIS DIPSTICK
Bilirubin, UA: NEGATIVE
Blood, UA: NEGATIVE
Glucose, UA: NEGATIVE
Ketones, UA: NEGATIVE
Leukocytes, UA: NEGATIVE
Nitrite, UA: NEGATIVE
Protein, UA: NEGATIVE
Spec Grav, UA: >=1.03 — AB (ref 1.010–1.025)
Urobilinogen, UA: 0.2 E.U./dL
pH, UA: 5.5 (ref 5.0–8.0)

## 2021-10-20 MED ORDER — VALSARTAN-HYDROCHLOROTHIAZIDE 80-12.5 MG PO TABS: 1.0000 | ORAL_TABLET | Freq: Every day | ORAL | 1 refills | Status: DC

## 2021-10-20 NOTE — Patient Instructions (Addendum)
Voltaren gel  Achilles Tendinitis  Achilles tendinitis is inflammation of the tough, cord-like band that attaches the lower leg muscles to the heel bone (Achilles tendon). This is usually caused by overusing the tendon and the ankle joint. Achilles tendinitis usually gets better over time with treatment and caring for yourself at home. It can take weeks or months to heal completely. What are the causes? This condition may be caused by: A sudden increase in exercise or activity, such as running. Doing the same exercises or activities, such as jumping, over and over. Not warming up calf muscles before exercising. Exercising in shoes that are worn out or not made for exercise. Having arthritis or a bone growth (spur) on the back of the heel bone. This can rub against the tendon and hurt it. Age-related wear and tear. Tendons become less flexible with age and are more likely to be injured. What are the signs or symptoms? Common symptoms of this condition include: Pain in the Achilles tendon or in the back of the leg, just above the heel. The pain usually gets worse with exercise. Stiffness or soreness in the back of the leg, especially in the morning. Swelling of the skin over the Achilles tendon. Thickening of the tendon. Trouble standing on tiptoe. How is this diagnosed? This condition is diagnosed based on your symptoms and a physical exam. You may have tests, including: X-rays. MRI. How is this treated? The goal of treatment is to relieve symptoms and help your injury heal. Treatment may include: Decreasing or stopping activities that caused the tendinitis. This may mean switching to low-impact exercises like biking or swimming. Icing the injured area. Doing physical therapy, including strengthening and stretching exercises. Taking NSAIDs, such as ibuprofen, to help relieve pain and swelling. Using supportive shoes, wraps, heel lifts, or a walking boot (air cast). Having surgery. This  may be done if your symptoms do not improve after other treatments. Using high-energy shock wave impulses to stimulate the healing process (extracorporeal shock wave therapy). This is rare. Having an injection of medicines that help relieve inflammation (corticosteroids). This is rare. Follow these instructions at home: If you have an air cast: Wear the air cast as told by your health care provider. Remove it only as told by your health care provider. Loosen it if your toes tingle, become numb, or turn cold and blue. Keep it clean. If the air cast is not waterproof: Do not let it get wet. Cover it with a watertight covering when you take a bath or shower. Managing pain, stiffness, and swelling  If directed, put ice on the injured area. To do this: If you have a removable air cast, remove it as told by your health care provider. Put ice in a plastic bag. Place a towel between your skin and the bag. Leave the ice on for 20 minutes, 2-3 times a day. Move your toes often to reduce stiffness and swelling. Raise (elevate) your foot above the level of your heart while you are sitting or lying down. Activity Gradually return to your normal activities as told by your health care provider. Ask your health care provider what activities are safe for you. Do not do activities that cause pain. Consider doing low-impact exercises, like cycling or swimming. Ask your health care provider when it is safe to drive if you have an air cast on your foot. If physical therapy was prescribed, do exercises as told by your health care provider or physical therapist. General instructions If  directed, wrap your foot with an elastic bandage or other wrap. This can help to keep your tendon from moving too much while it heals. Your health care provider will show you how to wrap your foot correctly. Wear supportive shoes or heel lifts only as told by your health care provider. Take over-the-counter and prescription  medicines only as told by your health care provider. Keep all follow-up visits as told by your health care provider. This is important. Contact a health care provider if you: Have symptoms that get worse. Have pain that does not get better with medicine. Develop new, unexplained symptoms. Develop warmth and swelling in your foot. Have a fever. Get help right away if you: Have a sudden popping sound or sensation in your Achilles tendon followed by severe pain. Cannot move your toes or foot. Cannot put any weight on your foot. Your foot or toes become numb and look white or blue even after loosening your bandage or air cast. Summary Achilles tendinitis is inflammation of the tough, cord-like band that attaches the lower leg muscles to the heel bone (Achilles tendon). This condition is usually caused by overusing the tendon and the ankle joint. It can also be caused by arthritis or normal aging. The most common symptoms of this condition include pain, swelling, or stiffness in the Achilles tendon or in the back of the leg. This condition is usually treated by decreasing or stopping activities that caused the tendinitis, icing the injured area, taking NSAIDs, and doing physical therapy. This information is not intended to replace advice given to you by your health care provider. Make sure you discuss any questions you have with your health care provider. Document Revised: 07/17/2018 Document Reviewed: 07/17/2018 Elsevier Patient Education  [place].   Plantar Fasciitis  Plantar fasciitis is a painful foot condition that affects the heel. It occurs when the band of tissue that connects the toes to the heel bone (plantar fascia) becomes irritated. This can happen as the result of exercising too much or doing other repetitive activities (overuse injury). Plantar fasciitis can cause mild irritation to severe pain that makes it difficult to walk or move. The pain is usually worse in the  morning after sleeping, or after sitting or lying down for a period of time. Pain may also be worse after long periods of walking or standing. What are the causes? This condition may be caused by: Standing for long periods of time. Wearing shoes that do not have good arch support. Doing activities that put stress on joints (high-impact activities). This includes ballet and exercise that makes your heart beat faster (aerobic exercise), such as running. Being overweight. An abnormal way of walking (gait). Tight muscles in the back of your lower leg (calf). High arches in your feet or flat feet. Starting a new athletic activity. What are the signs or symptoms? The main symptom of this condition is heel pain. Pain may get worse after the following: Taking the first steps after a time of rest, especially in the morning after awakening, or after you have been sitting or lying down for a while. Long periods of standing still. Pain may decrease after 30-45 minutes of activity, such as gentle walking. How is this diagnosed? This condition may be diagnosed based on your medical history, a physical exam, and your symptoms. Your health care provider will check for: A tender area on the bottom of your foot. A high arch in your foot or flat feet. Pain when you  move your foot. Difficulty moving your foot. You may have imaging tests to confirm the diagnosis, such as: X-rays. Ultrasound. MRI. How is this treated? Treatment for plantar fasciitis depends on how severe your condition is. Treatment may include: Rest, ice, pressure (compression), and raising (elevating) the affected foot. This is called RICE therapy. Your health care provider may recommend RICE therapy along with over-the-counter pain medicines to manage your pain. Exercises to stretch your calves and your plantar fascia. A splint that holds your foot in a stretched, upward position while you sleep (night splint). Physical therapy to  relieve symptoms and prevent problems in the future. Injections of steroid medicine (cortisone) to relieve pain and inflammation. Stimulating your plantar fascia with electrical impulses (extracorporeal shock wave therapy). This is usually the last treatment option before surgery. Surgery, if other treatments have not worked after 12 months. Follow these instructions at home: Managing pain, stiffness, and swelling  If directed, put ice on the painful area. To do this: Put ice in a plastic bag, or use a frozen bottle of water. Place a towel between your skin and the bag or bottle. Roll the bottom of your foot over the bag or bottle. Do this for 20 minutes, 2-3 times a day. Wear athletic shoes that have air-sole or gel-sole cushions, or try soft shoe inserts that are designed for plantar fasciitis. Elevate your foot above the level of your heart while you are sitting or lying down. Activity Avoid activities that cause pain. Ask your health care provider what activities are safe for you. Do physical therapy exercises and stretches as told by your health care provider. Try activities and forms of exercise that are easier on your joints (low impact). Examples include swimming, water aerobics, and biking. General instructions Take over-the-counter and prescription medicines only as told by your health care provider. Wear a night splint while sleeping, if told by your health care provider. Loosen the splint if your toes tingle, become numb, or turn cold and blue. Maintain a healthy weight, or work with your health care provider to lose weight as needed. Keep all follow-up visits. This is important. Contact a health care provider if you have: Symptoms that do not go away with home treatment. Pain that gets worse. Pain that affects your ability to move or do daily activities. Summary Plantar fasciitis is a painful foot condition that affects the heel. It occurs when the band of tissue that  connects the toes to the heel bone (plantar fascia) becomes irritated. Heel pain is the main symptom of this condition. It may get worse after exercising too much or standing still for a long time. Treatment varies, but it usually starts with rest, ice, pressure (compression), and raising (elevating) the affected foot. This is called RICE therapy. Over-the-counter medicines can also be used to manage pain. This information is not intended to replace advice given to you by your health care provider. Make sure you discuss any questions you have with your health care provider. Document Revised: 06/18/2019 Document Reviewed: 06/18/2019 Elsevier Patient Education  [place] Maintenance, Female Adopting a healthy lifestyle and getting preventive care are important in promoting health and wellness. Ask your health care provider about: The right schedule for you to have regular tests and exams. Things you can do on your own to prevent diseases and keep yourself healthy. What should I know about diet, weight, and exercise? Eat a healthy diet  Eat a diet that includes plenty of vegetables,  fruits, low-fat dairy products, and lean protein. Do not eat a lot of foods that are high in solid fats, added sugars, or sodium. Maintain a healthy weight Body mass index (BMI) is used to identify weight problems. It estimates body fat based on height and weight. Your health care provider can help determine your BMI and help you achieve or maintain a healthy weight. Get regular exercise Get regular exercise. This is one of the most important things you can do for your health. Most adults should: Exercise for at least 150 minutes each week. The exercise should increase your heart rate and make you sweat (moderate-intensity exercise). Do strengthening exercises at least twice a week. This is in addition to the moderate-intensity exercise. Spend less time sitting. Even light physical activity can be  beneficial. Watch cholesterol and blood lipids Have your blood tested for lipids and cholesterol at 49 years of age, then have this test every 5 years. Have your cholesterol levels checked more often if: Your lipid or cholesterol levels are high. You are older than 48 years of age. You are at high risk for heart disease. What should I know about cancer screening? Depending on your health history and family history, you may need to have cancer screening at various ages. This may include screening for: Breast cancer. Cervical cancer. Colorectal cancer. Skin cancer. Lung cancer. What should I know about heart disease, diabetes, and high blood pressure? Blood pressure and heart disease High blood pressure causes heart disease and increases the risk of stroke. This is more likely to develop in people who have high blood pressure readings or are overweight. Have your blood pressure checked: Every 3-5 years if you are 65-36 years of age. Every year if you are 69 years old or older. Diabetes Have regular diabetes screenings. This checks your fasting blood sugar level. Have the screening done: Once every three years after age 3 if you are at a normal weight and have a low risk for diabetes. More often and at a younger age if you are overweight or have a high risk for diabetes. What should I know about preventing infection? Hepatitis B If you have a higher risk for hepatitis B, you should be screened for this virus. Talk with your health care provider to find out if you are at risk for hepatitis B infection. Hepatitis C Testing is recommended for: Everyone born from [birth year] through 1967. Anyone with known risk factors for hepatitis C. Sexually transmitted infections (STIs) Get screened for STIs, including gonorrhea and chlamydia, if: You are sexually active and are younger than 48 years of age. You are older than 48 years of age and your health care provider tells you that you are at risk for  this type of infection. Your sexual activity has changed since you were last screened, and you are at increased risk for chlamydia or gonorrhea. Ask your health care provider if you are at risk. Ask your health care provider about whether you are at high risk for HIV. Your health care provider may recommend a prescription medicine to help prevent HIV infection. If you choose to take medicine to prevent HIV, you should first get tested for HIV. You should then be tested every 3 months for as long as you are taking the medicine. Pregnancy If you are about to stop having your period (premenopausal) and you may become pregnant, seek counseling before you get pregnant. Take 400 to 800 micrograms (mcg) of folic acid every day if you become  pregnant. Ask for birth control (contraception) if you want to prevent pregnancy. Osteoporosis and menopause Osteoporosis is a disease in which the bones lose minerals and strength with aging. This can result in bone fractures. If you are 2 years old or older, or if you are at risk for osteoporosis and fractures, ask your health care provider if you should: Be screened for bone loss. Take a calcium or vitamin D supplement to lower your risk of fractures. Be given hormone replacement therapy (HRT) to treat symptoms of menopause. Follow these instructions at home: Alcohol use Do not drink alcohol if: Your health care provider tells you not to drink. You are pregnant, may be pregnant, or are planning to become pregnant. If you drink alcohol: Limit how much you have to: 0-1 drink a day. Know how much alcohol is in your drink. In the [place] one drink equals one 12 oz bottle of beer (355 mL), one 5 oz glass of wine (148 mL), or one 1 oz glass of hard liquor (44 mL). Lifestyle Do not use any products that contain nicotine or tobacco. These products include cigarettes, chewing tobacco, and vaping devices, such as e-cigarettes. If you need help quitting, ask your health  care provider. Do not use street drugs. Do not share needles. Ask your health care provider for help if you need support or information about quitting drugs. General instructions Schedule regular health, dental, and eye exams. Stay current with your vaccines. Tell your health care provider if: You often feel depressed. You have ever been abused or do not feel safe at home. Summary Adopting a healthy lifestyle and getting preventive care are important in promoting health and wellness. Follow your health care provider's instructions about healthy diet, exercising, and getting tested or screened for diseases. Follow your health care provider's instructions on monitoring your cholesterol and blood pressure. This information is not intended to replace advice given to you by your health care provider. Make sure you discuss any questions you have with your health care provider. Document Revised: 07/21/2020 Document Reviewed: 07/21/2020 Elsevier Patient Education  [place].

## 2021-10-20 NOTE — Progress Notes (Unsigned)
Rich Brave [place]leton,acting as a Education administrator for Maximino Greenland, MD.,have documented all relevant documentation on the behalf of Maximino Greenland, MD,as directed by  Maximino Greenland, MD while in the presence of Maximino Greenland, MD.   Subjective:     Patient ID: Jessica Rodgers , female    DOB: Sep 19, 1975 , 46 y.o.   MRN: [id number]   Chief Complaint  Patient presents with   Annual Exam   Hypertension    HPI  She is here today for a full physical examination. She is followed by GYN for her pelvic exams. She reports compliance with meds. She denies headaches, chest pain and palpitations.   Hypertension This is a chronic problem. The current episode started more than 1 year ago. The problem has been gradually improving since onset. The problem is controlled. Pertinent negatives include no blurred vision. Risk factors for coronary artery disease include post-menopausal state. The current treatment provides moderate improvement. Compliance problems include exercise.      Past Medical History:  Diagnosis Date   Gallstones    GERD (gastroesophageal reflux disease)    Hypertension    Pulmonary emboli ([place])      Family History  Problem Relation Age of Onset   Hypertension Mother    Arthritis Mother    Glaucoma Mother    Other Father        unsure of his health   Breast cancer Maternal Grandmother 21     Current Outpatient Medications:    Cholecalciferol (VITAMIN D PO), Take 1 tablet by mouth daily. 5,000u, Disp: , Rfl:    ferrous sulfate 325 (65 FE) MG EC tablet, Take 1 tablet (325 mg total) by mouth daily with breakfast., Disp: 30 tablet, Rfl: 3   rivaroxaban (XARELTO) 10 MG TABS tablet, Take 1 tablet (10 mg total) by mouth daily., Disp: 90 tablet, Rfl: 1   valsartan-hydrochlorothiazide (DIOVAN-HCT) 80-12.5 MG tablet, Take 1 tablet by mouth daily., Disp: 90 tablet, Rfl: 1   No Known Allergies    The patient states she uses {contraceptive methods:5051} for birth control. Last  LMP was Patient's last menstrual period was 10/03/2021.Marland Kitchen {Dysmenorrhea-menorrhagia:21918}. Negative for: breast discharge, breast lump(s), breast pain and breast self exam. Associated symptoms include abnormal vaginal bleeding. Pertinent negatives include abnormal bleeding (hematology), anxiety, decreased libido, depression, difficulty falling sleep, dyspareunia, history of infertility, nocturia, sexual dysfunction, sleep disturbances, urinary incontinence, urinary urgency, vaginal discharge and vaginal itching. Diet regular.The patient states her exercise level is    . The patient's tobacco use is:  Social History   Tobacco Use  Smoking Status Former   Packs/day: 0.50   Years: 18.00   Total pack years: 9.00   Types: Cigarettes   Start date: 02/13/2003   Quit date: 08/09/2020   Years since quitting: 1.2  Smokeless Tobacco Never  . She has been exposed to passive smoke. The patient's alcohol use is:  Social History   Substance and Sexual Activity  Alcohol Use Yes   Alcohol/week: 4.0 standard drinks of alcohol   Types: 3 Glasses of wine, 1 Standard drinks or equivalent per week  . Additional information: Last pap ***, next one scheduled for ***.    Review of Systems  Constitutional: Negative.   HENT: Negative.    Eyes: Negative.  Negative for blurred vision.  Respiratory: Negative.    Cardiovascular: Negative.   Gastrointestinal: Negative.   Endocrine: Negative.   Genitourinary: Negative.   Musculoskeletal: Negative.  She c/o RLE  pain.  There is pain when she first gets out of bed in am. Denies fall/trauma.  States she did a lot of walking over the weekend which seemed to trigger her sx. Denies RLE paresthesias/weakness.  Skin: Negative.   Allergic/Immunologic: Negative.   Neurological: Negative.   Hematological: Negative.   Psychiatric/Behavioral: Negative.       Today's Vitals   10/20/21 0859  BP: 124/80  Pulse: 84  Temp: 98.1 F (36.7 C)  Weight: 214 lb 12.8 oz  (97.4 kg)  Height: 5' 3.2" (1.605 m)  PainSc: 0-No pain   Body mass index is 37.81 kg/m.  Wt Readings from Last 3 Encounters:  10/20/21 214 lb 12.8 oz (97.4 kg)  09/01/21 217 lb 8 oz (98.7 kg)  08/18/21 216 lb (98 kg)     Objective:  Physical Exam Vitals and nursing note reviewed.  Constitutional:      Appearance: Normal appearance. She is obese.  HENT:     Head: Normocephalic and atraumatic.     Right Ear: Tympanic membrane, ear canal and external ear normal.     Left Ear: Tympanic membrane, ear canal and external ear normal.     Nose: Nose normal.     Mouth/Throat:     Mouth: Mucous membranes are moist.     Pharynx: Oropharynx is clear.  Eyes:     Extraocular Movements: Extraocular movements intact.     Conjunctiva/sclera: Conjunctivae normal.     Pupils: Pupils are equal, round, and reactive to light.  Cardiovascular:     Rate and Rhythm: Normal rate and regular rhythm.     Pulses: Normal pulses.     Heart sounds: Normal heart sounds.  Pulmonary:     Effort: Pulmonary effort is normal.     Breath sounds: Normal breath sounds.  Chest:  Breasts:    Tanner Score is 5.     Left: Normal.     Comments: Firm round mass lateral R breast Abdominal:     General: Bowel sounds are normal.     Palpations: Abdomen is soft.     Comments: Soft, difficult to assess organomegaly  Genitourinary:    Comments: deferred Musculoskeletal:        General: Tenderness present. Normal range of motion.     Cervical back: Normal range of motion and neck supple.  Skin:    General: Skin is warm and dry.  Neurological:     General: No focal deficit present.     Mental Status: She is alert and oriented to person, place, and time.  Psychiatric:        Mood and Affect: Mood normal.        Behavior: Behavior normal.         Assessment And Plan:     1. Encounter for general adult medical examination w/o abnormal findings Comments: A full exam was performed. Importance of monthly self  breast exams was discussed with the patient. PATIENT IS ADVISED TO GET 30-45 MINUTES REGULAR EXERCISE NO LESS THAN FOUR TO FIVE DAYS PER WEEK - BOTH WEIGHTBEARING EXERCISES AND AEROBIC ARE RECOMMENDED.  PATIENT IS ADVISED TO FOLLOW A HEALTHY DIET WITH AT LEAST SIX FRUITS/VEGGIES PER DAY, DECREASE INTAKE OF RED MEAT, AND TO INCREASE FISH INTAKE TO TWO DAYS PER WEEK.  MEATS/FISH SHOULD NOT BE FRIED, BAKED OR BROILED IS PREFERABLE.  IT IS ALSO IMPORTANT TO CUT BACK ON YOUR SUGAR INTAKE. PLEASE AVOID ANYTHING WITH ADDED SUGAR, CORN SYRUP OR OTHER SWEETENERS.  IF YOU MUST USE A SWEETENER, YOU CAN TRY STEVIA. IT IS ALSO IMPORTANT TO AVOID ARTIFICIALLY SWEETENERS AND DIET BEVERAGES. LASTLY, I SUGGEST WEARING SPF 50 SUNSCREEN ON EXPOSED PARTS AND ESPECIALLY WHEN IN THE DIRECT SUNLIGHT FOR AN EXTENDED PERIOD OF TIME.  PLEASE AVOID FAST FOOD RESTAURANTS AND INCREASE YOUR WATER INTAKE. - Lipid panel - Hemoglobin A1c  2. Essential hypertension Comments: Chronic, well controlled. She will c/w valsartan 80/12.'5mg'$  daily.  EKG performed, atrial rhythm - this is a new finding. She is encouraged to follow low sodium diet. F/u 6 months.  - EKG 12-Lead - Microalbumin / Creatinine Urine Ratio - POCT Urinalysis Dipstick (81002) - valsartan-hydrochlorothiazide (DIOVAN-HCT) 80-12.5 MG tablet; Take 1 tablet by mouth daily.  Dispense: 90 tablet; Refill: 1  3. Achilles tendinitis of right lower extremity Comments: She was given some stretching exercises and advised to apply Voltaren gel to affected area twice daily. She will see Podiatry if her sx persist.   4. Class 2 severe obesity due to excess calories with serious comorbidity and body mass index (BMI) of 37.0 to 37.9 in adult [facility]) Comments: She is encouraged to aim for at least 150 minutes of exercise per week.    Patient was given opportunity to ask questions. Patient verbalized understanding of the plan and was able to repeat key elements of the plan. All questions  were answered to their satisfaction.   I, Maximino Greenland, MD, have reviewed all documentation for this visit. The documentation on 10/23/21 for the exam, diagnosis, procedures, and orders are all accurate and complete.   THE PATIENT IS ENCOURAGED TO PRACTICE SOCIAL DISTANCING DUE TO THE COVID-19 PANDEMIC.

## 2021-10-21 LAB — MICROALBUMIN / CREATININE URINE RATIO
Creatinine, Urine: 145.8 mg/dL
Microalb/Creat Ratio: 2 mg/g creat (ref 0–29)
Microalbumin, Urine: 3.4 ug/mL

## 2021-10-21 LAB — LIPID PANEL
Chol/HDL Ratio: 2.1 ratio (ref 0.0–4.4)
Cholesterol, Total: 206 mg/dL — ABNORMAL HIGH (ref 100–199)
HDL: 96 mg/dL (ref >39)
LDL Chol Calc (NIH): 97 mg/dL (ref 0–99)
Triglycerides: 76 mg/dL (ref 0–149)
VLDL Cholesterol Cal: 13 mg/dL (ref 5–40)

## 2021-10-21 LAB — HEMOGLOBIN A1C
Est. average glucose Bld gHb Est-mCnc: 94 mg/dL
Hgb A1c MFr Bld: 4.9 % (ref 4.8–5.6)

## 2021-11-10 ENCOUNTER — Encounter: Payer: Self-pay | Admitting: Internal Medicine

## 2021-12-03 ENCOUNTER — Other Ambulatory Visit: Payer: Self-pay | Admitting: Physician Assistant

## 2021-12-03 DIAGNOSIS — D508 Other iron deficiency anemias: Secondary | ICD-10-CM

## 2021-12-04 ENCOUNTER — Inpatient Hospital Stay: Payer: BC Managed Care – PPO | Attending: Physician Assistant

## 2021-12-04 ENCOUNTER — Other Ambulatory Visit: Payer: Self-pay

## 2021-12-04 ENCOUNTER — Telehealth: Payer: Self-pay

## 2021-12-04 ENCOUNTER — Inpatient Hospital Stay (HOSPITAL_BASED_OUTPATIENT_CLINIC_OR_DEPARTMENT_OTHER): Payer: BC Managed Care – PPO | Admitting: Physician Assistant

## 2021-12-04 DIAGNOSIS — D500 Iron deficiency anemia secondary to blood loss (chronic): Secondary | ICD-10-CM

## 2021-12-04 DIAGNOSIS — N920 Excessive and frequent menstruation with regular cycle: Secondary | ICD-10-CM | POA: Insufficient documentation

## 2021-12-04 DIAGNOSIS — Z86711 Personal history of pulmonary embolism: Secondary | ICD-10-CM | POA: Diagnosis not present

## 2021-12-04 DIAGNOSIS — Z86718 Personal history of other venous thrombosis and embolism: Secondary | ICD-10-CM | POA: Diagnosis not present

## 2021-12-04 DIAGNOSIS — D508 Other iron deficiency anemias: Secondary | ICD-10-CM

## 2021-12-04 DIAGNOSIS — D509 Iron deficiency anemia, unspecified: Secondary | ICD-10-CM | POA: Insufficient documentation

## 2021-12-04 LAB — CMP (CANCER CENTER ONLY)
ALT: 14 U/L (ref 0–44)
AST: 20 U/L (ref 15–41)
Albumin: 3.7 g/dL (ref 3.5–5.0)
Alkaline Phosphatase: 47 U/L (ref 38–126)
Anion gap: 4 — ABNORMAL LOW (ref 5–15)
BUN: 16 mg/dL (ref 6–20)
CO2: 28 mmol/L (ref 22–32)
Calcium: 8.9 mg/dL (ref 8.9–10.3)
Chloride: 107 mmol/L (ref 98–111)
Creatinine: 0.97 mg/dL (ref 0.44–1.00)
GFR, Estimated: >60 mL/min (ref >60)
Glucose, Bld: 109 mg/dL — ABNORMAL HIGH (ref 70–99)
Potassium: 4.1 mmol/L (ref 3.5–5.1)
Sodium: 139 mmol/L (ref 135–145)
Total Bilirubin: 0.4 mg/dL (ref 0.3–1.2)
Total Protein: 7 g/dL (ref 6.5–8.1)

## 2021-12-04 LAB — CBC WITH DIFFERENTIAL (CANCER CENTER ONLY)
Abs Immature Granulocytes: 0.01 10*3/uL (ref 0.00–0.07)
Basophils Absolute: 0 10*3/uL (ref 0.0–0.1)
Basophils Relative: 0 %
Eosinophils Absolute: 0.1 10*3/uL (ref 0.0–0.5)
Eosinophils Relative: 1 %
HCT: 35.1 % — ABNORMAL LOW (ref 36.0–46.0)
Hemoglobin: 11.6 g/dL — ABNORMAL LOW (ref 12.0–15.0)
Immature Granulocytes: 0 %
Lymphocytes Relative: 30 %
Lymphs Abs: 1.5 10*3/uL (ref 0.7–4.0)
MCH: 32.9 pg (ref 26.0–34.0)
MCHC: 33 g/dL (ref 30.0–36.0)
MCV: 99.4 fL (ref 80.0–100.0)
Monocytes Absolute: 0.3 10*3/uL (ref 0.1–1.0)
Monocytes Relative: 6 %
Neutro Abs: 3.1 10*3/uL (ref 1.7–7.7)
Neutrophils Relative %: 63 %
Platelet Count: 277 10*3/uL (ref 150–400)
RBC: 3.53 MIL/uL — ABNORMAL LOW (ref 3.87–5.11)
RDW: 13 % (ref 11.5–15.5)
WBC Count: 4.9 10*3/uL (ref 4.0–10.5)
nRBC: 0 % (ref 0.0–0.2)

## 2021-12-04 LAB — IRON AND IRON BINDING CAPACITY (CC-WL,HP ONLY)
Iron: 50 ug/dL (ref 28–170)
Saturation Ratios: 11 % (ref 10.4–31.8)
TIBC: 465 ug/dL — ABNORMAL HIGH (ref 250–450)
UIBC: 415 ug/dL (ref 148–442)

## 2021-12-04 LAB — FERRITIN: Ferritin: 7 ng/mL — ABNORMAL LOW (ref 11–307)

## 2021-12-04 NOTE — Telephone Encounter (Signed)
-----   Message from Lincoln Brigham, PA-C sent at 12/04/2021  2:41 PM EDT ----- Please notify patient that iron deficiency is persistent so recommend IV iron to help improve levels. We will arrange infusion at [facility] infusion

## 2021-12-04 NOTE — Progress Notes (Signed)
[place] [phone]   [phone]  PROGRESS NOTE  Patient Care Team: Glendale Chard, MD as PCP - General (Internal Medicine) Buford Dresser, MD as PCP - Cardiology (Cardiology) Allyn Kenner, DO as Consulting Physician (Obstetrics and Gynecology)  Hematological/Oncological History 1) 08/15/2020-08/18/2020: Admitted after presenting with increasing shortness of breath. CTA chest showed extensive bilateral pulmonary emboli with associated right heart strain. Doppler US revealed age indeterminate DVT involving the left popliteal vein and let peroneal veins.Treated with heparin drip and discharged home on Xarelto.   2) 10/28/2020: Establish care with Dede Query PA-C  CHIEF COMPLAINTS/PURPOSE OF CONSULTATION:  Bilateral Pulmonary Emboli and Left LE DVT Iron deficiency anemia  HISTORY OF PRESENTING ILLNESS:  Jessica Rodgers 46 y.o. female who presents to the clinic for a follow-up for history of pulmonary emboli and DVT along with iron deficiency anemia.   On exam today, [name] reports her energy levels are fairly stable since last visit.  She does have days when she is more fatigued than usual but adds that it could be due to overexerting herself.  Her appetite is good and she has gained approximately 8 pounds since last visit.  She denies nausea, vomiting or abdominal pain.  Her bowel habits are unchanged without any recurrent episodes of diarrhea or constipation.  She denies easy bruising or signs of active bleeding.  Her menstrual cycles are fairly similar.  She has a cycle every 18 days that last approximately 5 to 6 days.  There is usually 2 days of heavy bleeding.  He is not planning on proceeding with any interventions for her heavy menstrual bleeding as this is due to being perimenopausal.  She is compliant with taking Xarelto and ferrous sulfate as prescribed.    She denies fevers, chills, night sweats, shortness of breath, chest pain  or cough. She has no other complaints. Rest of the 10 point ROS is below.   MEDICAL HISTORY:  Past Medical History:  Diagnosis Date   Gallstones    GERD (gastroesophageal reflux disease)    Hypertension    Pulmonary emboli ([facility])     SURGICAL HISTORY: Past Surgical History:  Procedure Laterality Date   BREAST EXCISIONAL BIOPSY Right 04/07/2017   [place]   RADIOACTIVE SEED GUIDED EXCISIONAL BREAST BIOPSY Right 04/07/2017   Procedure: RIGHT RADIOACTIVE SEED GUIDED EXCISIONAL BREAST BIOPSY;  Surgeon: Rolm Bookbinder, MD;  Location: [place];  Service: General;  Laterality: Right;   TUBAL LIGATION  1997    SOCIAL HISTORY: Social History   Socioeconomic History   Marital status: Divorced    Spouse name: Not on file   Number of children: Not on file   Years of education: Not on file   Highest education level: Not on file  Occupational History   Not on file  Tobacco Use   Smoking status: Former    Packs/day: 0.50    Years: 18.00    Total pack years: 9.00    Types: Cigarettes    Start date: 02/13/2003    Quit date: 08/09/2020    Years since quitting: 1.3   Smokeless tobacco: Never  Vaping Use   Vaping Use: Never used  Substance and Sexual Activity   Alcohol use: Yes    Alcohol/week: 4.0 standard drinks of alcohol    Types: 3 Glasses of wine, 1 Standard drinks or equivalent per week   Drug use: No   Sexual activity: Not on file  Other Topics Concern   Not on  file  Social History Narrative   Not on file   Social Determinants of Health   Financial Resource Strain: Not on file  Food Insecurity: Not on file  Transportation Needs: Not on file  Physical Activity: Not on file  Stress: Not on file  Social Connections: Not on file  Intimate Partner Violence: Not on file    FAMILY HISTORY: Family History  Problem Relation Age of Onset   Hypertension Mother    Arthritis Mother    Glaucoma Mother    Other Father        unsure of his health   Breast  cancer Maternal Grandmother 23    ALLERGIES:  has No Known Allergies.  MEDICATIONS:  Current Outpatient Medications  Medication Sig Dispense Refill   ferrous sulfate 325 (65 FE) MG EC tablet Take 1 tablet (325 mg total) by mouth daily with breakfast. 30 tablet 3   rivaroxaban (XARELTO) 10 MG TABS tablet Take 1 tablet (10 mg total) by mouth daily. 90 tablet 1   valsartan-hydrochlorothiazide (DIOVAN-HCT) 80-12.5 MG tablet Take 1 tablet by mouth daily. 90 tablet 1   No current facility-administered medications for this visit.    REVIEW OF SYSTEMS:   Constitutional: ( - ) fevers, ( - )  chills , ( - ) night sweats Eyes: ( - ) blurriness of vision, ( - ) double vision, ( - ) watery eyes Ears, nose, mouth, throat, and face: ( - ) mucositis, ( - ) sore throat Respiratory: ( - ) cough, ( - ) dyspnea, ( - ) wheezes Cardiovascular: ( - ) palpitation, ( - ) chest discomfort, ( - ) lower extremity swelling Gastrointestinal:  ( - ) nausea, ( - ) heartburn, ( - ) change in bowel habits Skin: ( - ) abnormal skin rashes Lymphatics: ( - ) new lymphadenopathy, ( - ) easy bruising Neurological: ( - ) numbness, ( - ) tingling, ( - ) new weaknesses Behavioral/Psych: ( - ) mood change, ( - ) new changes  All other systems were reviewed with the patient and are negative.  PHYSICAL EXAMINATION: ECOG PERFORMANCE STATUS: 1 - Symptomatic but completely ambulatory  Vitals:   12/04/21 0908  BP: (!) 145/87  Pulse: 62  Resp: 15  Temp: 97.9 F (36.6 C)  SpO2: 98%    Filed Weights   12/04/21 0908  Weight: 222 lb 6.4 oz (100.9 kg)     GENERAL: well appearing female in NAD  SKIN: skin color, texture, turgor are normal, no rashes or significant lesions EYES: conjunctiva are pink and non-injected, sclera clear LUNGS: clear to auscultation and percussion with normal breathing effort HEART: regular rate & rhythm and no murmurs and no lower extremity edema Musculoskeletal: no cyanosis of digits and no  clubbing  PSYCH: alert & oriented x 3, fluent speech NEURO: no focal motor/sensory deficits  LABORATORY DATA:  I have reviewed the data as listed    Latest Ref Rng & Units 12/04/2021    8:35 AM 09/01/2021    8:38 AM 05/29/2021    9:03 AM  CBC  WBC 4.0 - 10.5 K/uL 4.9  6.9  7.2   Hemoglobin 12.0 - 15.0 g/dL 11.6  11.6  10.5   Hematocrit 36.0 - 46.0 % 35.1  35.3  32.8   Platelets 150 - 400 K/uL 277  312  337        Latest Ref Rng & Units 09/01/2021    8:38 AM 08/18/2021   10:40 AM 05/29/2021  9:03 AM  CMP  Glucose 70 - 99 mg/dL 101  97  93   BUN 6 - 20 mg/dL '13  16  11   '$ Creatinine 0.44 - 1.00 mg/dL 0.97  0.89  0.93   Sodium 135 - 145 mmol/L 136  141  135   Potassium 3.5 - 5.1 mmol/L 4.5  4.9  3.4   Chloride 98 - 111 mmol/L 107  106  105   CO2 22 - 32 mmol/L '24  22  25   '$ Calcium 8.9 - 10.3 mg/dL 9.5  9.4  8.7   Total Protein 6.5 - 8.1 g/dL 7.6   7.4   Total Bilirubin 0.3 - 1.2 mg/dL 0.3   0.5   Alkaline Phos 38 - 126 U/L 43   40   AST 15 - 41 U/L 20   17   ALT 0 - 44 U/L 14   10    RADIOGRAPHIC STUDIES: I have personally reviewed the radiological images as listed and agreed with the findings in the report.  08/15/2020: CTA Chest: 1. Extensive bilateral pulmonary embolism with associated right heart strain. 2. Cholelithiasis.  08/16/2020: [place] Bilateral LE: Findings consistent with age indeterminate deep vein thrombosis  involving the left popliteal vein, and left peroneal veins.   ASSESSMENT & PLAN Kathe Wirick is a 46 y.o. female for a follow up for bilateral extensive pulmonary emboli and left lower extremity DVT that was diagnosed in June 2022.   #Extensive bilateral pulmonary emboli and LLE DVT: -CTA chest from 08/15/2020 showed extensive bilateral pulmonary embolism with associated right heart strain. Doppler US on 08/16/2020 showed DVT involving the left popliteal vein and left peroneal veins.  --Risk factors include smoking and sedentary lifestyle. Patient  continues to smoke occasionally and sits for long periods of time when she works.  --Hypercoagulable workup was negative for any clotting disorders.  --After six months of anticoagulation, we discussed risks versus benefits of continuing on Xarelto. Since patient reports that she has a sedentary lifestyle and smokes intermittently, we decided to continue on anticoagulation but transition to maintenance dose.  --continue with maintenance dose of Xarelto 10 mg once daily  #Iron deficiency anemia 2/2 GYN bleeding: --Labs today show stable/mild anemia with Hgb 11.6. Iron panel shows persistent deficiency with ferritin 7, saturation 11%.  --Recommend IV iron to help bolster iron levels.  --Continue ferrous sulfate 325 mg once a day with a source of vitamin C --Continue to incorporate iron rich foods into diet.     Follow up: --3 months with labs  No orders of the defined types were placed in this encounter.  All questions were answered. The patient knows to call the clinic with any problems, questions or concerns.  I have spent a total of 30 minutes minutes of face-to-face and non-face-to-face time, preparing to see the [place] a medically appropriate examination, counseling and educating the patient, ordering tests/meds, documenting clinical information in the electronic health record, and care coordination.   Dede Query PA-C Dept of Hematology and [place] at [facility] Phone: [phone]

## 2021-12-04 NOTE — Telephone Encounter (Signed)
Pt advised with VU and agreed with plan. 

## 2021-12-07 ENCOUNTER — Telehealth: Payer: Self-pay | Admitting: Physician Assistant

## 2021-12-07 ENCOUNTER — Other Ambulatory Visit: Payer: Self-pay | Admitting: Pharmacy Technician

## 2021-12-07 ENCOUNTER — Telehealth: Payer: Self-pay | Admitting: Pharmacy Technician

## 2021-12-07 NOTE — Telephone Encounter (Addendum)
Dr. Charlies Silvers,  Monoferric is a non preferred medication with BCBS and will likely be denied due to patient has not tried and or failed step therapy. Perferred medication are feraheme or venfoer. Would you like to try Feraheme or Venofer?  Auth Submission: APPROVED Payer: [employer] [place] Medication & CPT/J Code(s) submitted: Feraheme (ferumoxytol) [id number] Route of submission (phone, fax, portal):  Phone # [phone] Fax # Auth type: Buy/Bill Units/visits requested: X2 Reference number: [id number] Approval from: 12/07/21 to 03/14/22

## 2021-12-07 NOTE — Telephone Encounter (Signed)
Per 9/22 los called and spoke to pt about appointment

## 2021-12-09 ENCOUNTER — Ambulatory Visit (INDEPENDENT_AMBULATORY_CARE_PROVIDER_SITE_OTHER): Payer: BC Managed Care – PPO

## 2021-12-09 VITALS — BP 131/85 | HR 68 | Temp 97.5°F | Resp 18 | Ht 63.0 in | Wt 223.0 lb

## 2021-12-09 DIAGNOSIS — D500 Iron deficiency anemia secondary to blood loss (chronic): Secondary | ICD-10-CM

## 2021-12-09 MED ORDER — SODIUM CHLORIDE 0.9 % IV SOLN
510.0000 mg | Freq: Once | INTRAVENOUS | Status: AC
  Administered 2021-12-09: 510 mg via INTRAVENOUS
  Filled 2021-12-09: qty 17

## 2021-12-09 NOTE — Progress Notes (Signed)
Diagnosis: Iron Deficiency Anemia  Provider:  Marshell Garfinkel MD  Procedure: Infusion  IV Type: Peripheral, IV Location: R Antecubital  Feraheme (Ferumoxytol), Dose: 510 mg  Infusion Start Time: 2330  Infusion Stop Time: 1430  Post Infusion IV Care: Observation period completed and Peripheral IV Discontinued  Discharge: Condition: Good, Destination: Home . AVS provided to patient.   Performed by:  Cleophus Molt, RN

## 2021-12-09 NOTE — Patient Instructions (Signed)

## 2021-12-16 ENCOUNTER — Ambulatory Visit (INDEPENDENT_AMBULATORY_CARE_PROVIDER_SITE_OTHER): Payer: BC Managed Care – PPO

## 2021-12-16 VITALS — BP 107/74 | HR 71 | Temp 98.1°F | Resp 16 | Ht 63.0 in | Wt 219.6 lb

## 2021-12-16 DIAGNOSIS — D500 Iron deficiency anemia secondary to blood loss (chronic): Secondary | ICD-10-CM | POA: Diagnosis not present

## 2021-12-16 DIAGNOSIS — N920 Excessive and frequent menstruation with regular cycle: Secondary | ICD-10-CM | POA: Diagnosis not present

## 2021-12-16 MED ORDER — SODIUM CHLORIDE 0.9 % IV SOLN
510.0000 mg | Freq: Once | INTRAVENOUS | Status: AC
  Administered 2021-12-16: 510 mg via INTRAVENOUS
  Filled 2021-12-16: qty 17

## 2021-12-16 NOTE — Progress Notes (Signed)
Diagnosis: Iron Deficiency Anemia  Provider:  Marshell Garfinkel MD  Procedure: Infusion  IV Type: Peripheral, IV Location: R Antecubital  Feraheme (Ferumoxytol), Dose: 510 mg  Infusion Start Time: 5573  Infusion Stop Time: 2202  Post Infusion IV Care: Peripheral IV Discontinued  Discharge: Condition: Good, Destination: Home . AVS provided to patient.   Performed by:  Koren Shiver, RN

## 2021-12-21 ENCOUNTER — Encounter: Payer: Self-pay | Admitting: Physician Assistant

## 2021-12-21 ENCOUNTER — Encounter: Payer: Self-pay | Admitting: Internal Medicine

## 2022-01-26 ENCOUNTER — Other Ambulatory Visit: Payer: Self-pay | Admitting: Internal Medicine

## 2022-01-26 ENCOUNTER — Encounter: Payer: Self-pay | Admitting: Internal Medicine

## 2022-01-26 DIAGNOSIS — Z1231 Encounter for screening mammogram for malignant neoplasm of breast: Secondary | ICD-10-CM

## 2022-01-29 ENCOUNTER — Ambulatory Visit
Admission: RE | Admit: 2022-01-29 | Discharge: 2022-01-29 | Disposition: A | Discharge status: Home / Self Care | Payer: BC Managed Care – PPO | Source: Ambulatory Visit | Attending: Internal Medicine | Admitting: Internal Medicine

## 2022-01-29 DIAGNOSIS — Z1231 Encounter for screening mammogram for malignant neoplasm of breast: Secondary | ICD-10-CM

## 2022-02-03 ENCOUNTER — Other Ambulatory Visit: Payer: Self-pay | Admitting: Internal Medicine

## 2022-02-03 DIAGNOSIS — R928 Other abnormal and inconclusive findings on diagnostic imaging of breast: Secondary | ICD-10-CM

## 2022-02-22 ENCOUNTER — Other Ambulatory Visit: Payer: Self-pay | Admitting: Physician Assistant

## 2022-02-22 ENCOUNTER — Ambulatory Visit
Admission: RE | Admit: 2022-02-22 | Discharge: 2022-02-22 | Disposition: A | Discharge status: Home / Self Care | Payer: BC Managed Care – PPO | Source: Ambulatory Visit | Attending: Internal Medicine | Admitting: Internal Medicine

## 2022-02-22 ENCOUNTER — Ambulatory Visit: Payer: BC Managed Care – PPO

## 2022-02-22 DIAGNOSIS — R928 Other abnormal and inconclusive findings on diagnostic imaging of breast: Secondary | ICD-10-CM

## 2022-02-22 DIAGNOSIS — D500 Iron deficiency anemia secondary to blood loss (chronic): Secondary | ICD-10-CM

## 2022-02-23 ENCOUNTER — Inpatient Hospital Stay (HOSPITAL_BASED_OUTPATIENT_CLINIC_OR_DEPARTMENT_OTHER): Payer: BC Managed Care – PPO | Admitting: Physician Assistant

## 2022-02-23 ENCOUNTER — Inpatient Hospital Stay: Payer: BC Managed Care – PPO | Attending: Physician Assistant

## 2022-02-23 VITALS — BP 114/80 | HR 75 | Temp 97.6°F | Resp 14 | Wt 220.2 lb

## 2022-02-23 DIAGNOSIS — Z09 Encounter for follow-up examination after completed treatment for conditions other than malignant neoplasm: Secondary | ICD-10-CM | POA: Insufficient documentation

## 2022-02-23 DIAGNOSIS — D500 Iron deficiency anemia secondary to blood loss (chronic): Secondary | ICD-10-CM | POA: Insufficient documentation

## 2022-02-23 DIAGNOSIS — N920 Excessive and frequent menstruation with regular cycle: Secondary | ICD-10-CM | POA: Diagnosis present

## 2022-02-23 DIAGNOSIS — Z86711 Personal history of pulmonary embolism: Secondary | ICD-10-CM | POA: Diagnosis not present

## 2022-02-23 DIAGNOSIS — Z86718 Personal history of other venous thrombosis and embolism: Secondary | ICD-10-CM | POA: Diagnosis not present

## 2022-02-23 DIAGNOSIS — Z7901 Long term (current) use of anticoagulants: Secondary | ICD-10-CM | POA: Diagnosis not present

## 2022-02-23 LAB — CBC WITH DIFFERENTIAL (CANCER CENTER ONLY)
Abs Immature Granulocytes: 0.02 10*3/uL (ref 0.00–0.07)
Basophils Absolute: 0 10*3/uL (ref 0.0–0.1)
Basophils Relative: 0 %
Eosinophils Absolute: 0 10*3/uL (ref 0.0–0.5)
Eosinophils Relative: 1 %
HCT: 36.9 % (ref 36.0–46.0)
Hemoglobin: 12.5 g/dL (ref 12.0–15.0)
Immature Granulocytes: 0 %
Lymphocytes Relative: 28 %
Lymphs Abs: 1.7 10*3/uL (ref 0.7–4.0)
MCH: 35.3 pg — ABNORMAL HIGH (ref 26.0–34.0)
MCHC: 33.9 g/dL (ref 30.0–36.0)
MCV: 104.2 fL — ABNORMAL HIGH (ref 80.0–100.0)
Monocytes Absolute: 0.4 10*3/uL (ref 0.1–1.0)
Monocytes Relative: 7 %
Neutro Abs: 4 10*3/uL (ref 1.7–7.7)
Neutrophils Relative %: 64 %
Platelet Count: 279 10*3/uL (ref 150–400)
RBC: 3.54 MIL/uL — ABNORMAL LOW (ref 3.87–5.11)
RDW: 12.2 % (ref 11.5–15.5)
WBC Count: 6.2 10*3/uL (ref 4.0–10.5)
nRBC: 0 % (ref 0.0–0.2)

## 2022-02-23 LAB — IRON AND IRON BINDING CAPACITY (CC-WL,HP ONLY)
Iron: 76 ug/dL (ref 28–170)
Saturation Ratios: 21 % (ref 10.4–31.8)
TIBC: 360 ug/dL (ref 250–450)
UIBC: 284 ug/dL (ref 148–442)

## 2022-02-23 LAB — CMP (CANCER CENTER ONLY)
ALT: 16 U/L (ref 0–44)
AST: 22 U/L (ref 15–41)
Albumin: 4 g/dL (ref 3.5–5.0)
Alkaline Phosphatase: 43 U/L (ref 38–126)
Anion gap: 6 (ref 5–15)
BUN: 12 mg/dL (ref 6–20)
CO2: 30 mmol/L (ref 22–32)
Calcium: 9.8 mg/dL (ref 8.9–10.3)
Chloride: 102 mmol/L (ref 98–111)
Creatinine: 0.99 mg/dL (ref 0.44–1.00)
GFR, Estimated: >60 mL/min (ref >60)
Glucose, Bld: 104 mg/dL — ABNORMAL HIGH (ref 70–99)
Potassium: 4 mmol/L (ref 3.5–5.1)
Sodium: 138 mmol/L (ref 135–145)
Total Bilirubin: 0.3 mg/dL (ref 0.3–1.2)
Total Protein: 7.1 g/dL (ref 6.5–8.1)

## 2022-02-23 LAB — FERRITIN: Ferritin: 52 ng/mL (ref 11–307)

## 2022-02-23 MED ORDER — FERROUS SULFATE 325 (65 FE) MG PO TBEC: 325.0000 mg | DELAYED_RELEASE_TABLET | Freq: Every day | ORAL | 6 refills | Status: DC

## 2022-02-23 NOTE — Progress Notes (Signed)
[place] [phone]) [phone]   [phone]  PROGRESS NOTE  Patient Care Team: Glendale Chard, MD as PCP - General (Internal Medicine) Buford Dresser, MD as PCP - Cardiology (Cardiology) Allyn Kenner, DO as Consulting Physician (Obstetrics and Gynecology)  Hematological/Oncological History 1) 08/15/2020-08/18/2020: Admitted after presenting with increasing shortness of breath. CTA chest showed extensive bilateral pulmonary emboli with associated right heart strain. Doppler US revealed age indeterminate DVT involving the left popliteal vein and let peroneal veins.Treated with heparin drip and discharged home on Xarelto.   2) 10/28/2020: Establish care with Dede Query PA-C  3) 12/09/2021 and 12/16/2021: Received IV feraheme 510 mg weekly x 2 doses  CHIEF COMPLAINTS/PURPOSE OF CONSULTATION:  Bilateral Pulmonary Emboli and Left LE DVT Iron deficiency anemia  HISTORY OF PRESENTING ILLNESS:  Jessica Rodgers 46 y.o. female who presents to the clinic for a follow-up for history of pulmonary emboli and DVT along with iron deficiency anemia.   On exam today, Jessica Rodgers reports her energy levels have improved since receiving IV iron infusions. She continues on PO iron supplementation. Her appetite and weight are fairly stable. She denies nausea, vomiting or abdominal pain. Her bowel habits are unchanged. She reports her menstrual cycles are overall stable. Each cycle lasts 5 days with 2 days of heavy bleeding. She is compliant with taking Xarelto without easy bruising or signs of active bleeding. Her menstrual bleeding improved after transition to Xarelto maintenance dose. She denies fevers, chills, night sweats, shortness of breath, chest pain or cough. She has no other complaints. Rest of the 10 point ROS is below.   MEDICAL HISTORY:  Past Medical History:  Diagnosis Date   Gallstones    GERD (gastroesophageal reflux disease)    Hypertension     Pulmonary emboli (HCC)     SURGICAL HISTORY: Past Surgical History:  Procedure Laterality Date   BREAST EXCISIONAL BIOPSY Right 04/07/2017   [facility]   RADIOACTIVE SEED GUIDED EXCISIONAL BREAST BIOPSY Right 04/07/2017   Procedure: RIGHT RADIOACTIVE SEED GUIDED EXCISIONAL BREAST BIOPSY;  Surgeon: Rolm Bookbinder, MD;  Location: [facility];  Service: General;  Laterality: Right;   TUBAL LIGATION  1997    SOCIAL HISTORY: Social History   Socioeconomic History   Marital status: Divorced    Spouse name: Not on file   Number of children: Not on file   Years of education: Not on file   Highest education level: Not on file  Occupational History   Not on file  Tobacco Use   Smoking status: Former    Packs/day: 0.50    Years: 18.00    Total pack years: 9.00    Types: Cigarettes    Start date: 02/13/2003    Quit date: 08/09/2020    Years since quitting: 1.5   Smokeless tobacco: Never  Vaping Use   Vaping Use: Never used  Substance and Sexual Activity   Alcohol use: Yes    Alcohol/week: 4.0 standard drinks of alcohol    Types: 3 Glasses of wine, 1 Standard drinks or equivalent per week   Drug use: No   Sexual activity: Not on file  Other Topics Concern   Not on file  Social History Narrative   Not on file   Social Determinants of Health   Financial Resource Strain: Not on file  Food Insecurity: Not on file  Transportation Needs: Not on file  Physical Activity: Not on file  Stress: Not on file  Social Connections: Not on file  Intimate Partner Violence: Not on file    FAMILY HISTORY: Family History  Problem Relation Age of Onset   Hypertension Mother    Arthritis Mother    Glaucoma Mother    Other Father        unsure of his health   Breast cancer Maternal Grandmother 11    ALLERGIES:  has No Known Allergies.  MEDICATIONS:  Current Outpatient Medications  Medication Sig Dispense Refill   rivaroxaban (XARELTO) 10 MG TABS tablet Take 1 tablet (10  mg total) by mouth daily. 90 tablet 1   valsartan-hydrochlorothiazide (DIOVAN-HCT) 80-12.5 MG tablet Take 1 tablet by mouth daily. 90 tablet 1   ferrous sulfate 325 (65 FE) MG EC tablet Take 1 tablet (325 mg total) by mouth daily with breakfast. (Patient not taking: Reported on 02/23/2022) 30 tablet 3   No current facility-administered medications for this visit.    REVIEW OF SYSTEMS:   Constitutional: ( - ) fevers, ( - )  chills , ( - ) night sweats Eyes: ( - ) blurriness of vision, ( - ) double vision, ( - ) watery eyes Ears, nose, mouth, throat, and face: ( - ) mucositis, ( - ) sore throat Respiratory: ( - ) cough, ( - ) dyspnea, ( - ) wheezes Cardiovascular: ( - ) palpitation, ( - ) chest discomfort, ( - ) lower extremity swelling Gastrointestinal:  ( - ) nausea, ( - ) heartburn, ( - ) change in bowel habits Skin: ( - ) abnormal skin rashes Lymphatics: ( - ) new lymphadenopathy, ( - ) easy bruising Neurological: ( - ) numbness, ( - ) tingling, ( - ) new weaknesses Behavioral/Psych: ( - ) mood change, ( - ) new changes  All other systems were reviewed with the patient and are negative.  PHYSICAL EXAMINATION: ECOG PERFORMANCE STATUS: 0 - Asymptomatic  Vitals:   02/23/22 1008  BP: 114/80  Pulse: 75  Resp: 14  Temp: 97.6 F (36.4 C)  SpO2: 100%    Filed Weights   02/23/22 1008  Weight: 220 lb 3.2 oz (99.9 kg)     GENERAL: well appearing female in NAD  SKIN: skin color, texture, turgor are normal, no rashes or significant lesions EYES: conjunctiva are pink and non-injected, sclera clear LUNGS: clear to auscultation and percussion with normal breathing effort HEART: regular rate & rhythm and no murmurs and no lower extremity edema Musculoskeletal: no cyanosis of digits and no clubbing  PSYCH: alert & oriented x 3, fluent speech NEURO: no focal motor/sensory deficits  LABORATORY DATA:  I have reviewed the data as listed    Latest Ref Rng & Units 02/23/2022    9:30 AM  12/04/2021    8:35 AM 09/01/2021    8:38 AM  CBC  WBC 4.0 - 10.5 K/uL 6.2  4.9  6.9   Hemoglobin 12.0 - 15.0 g/dL 12.5  11.6  11.6   Hematocrit 36.0 - 46.0 % 36.9  35.1  35.3   Platelets 150 - 400 K/uL 279  277  312        Latest Ref Rng & Units 12/04/2021    8:35 AM 09/01/2021    8:38 AM 08/18/2021   10:40 AM  CMP  Glucose 70 - 99 mg/dL 109  101  97   BUN 6 - 20 mg/dL '16  13  16   '$ Creatinine 0.44 - 1.00 mg/dL 0.97  0.97  0.89   Sodium 135 - 145 mmol/L 139  136  141  Potassium 3.5 - 5.1 mmol/L 4.1  4.5  4.9   Chloride 98 - 111 mmol/L 107  107  106   CO2 22 - 32 mmol/L '28  24  22   '$ Calcium 8.9 - 10.3 mg/dL 8.9  9.5  9.4   Total Protein 6.5 - 8.1 g/dL 7.0  7.6    Total Bilirubin 0.3 - 1.2 mg/dL 0.4  0.3    Alkaline Phos 38 - 126 U/L 47  43    AST 15 - 41 U/L 20  20    ALT 0 - 44 U/L 14  14     RADIOGRAPHIC STUDIES: I have personally reviewed the radiological images as listed and agreed with the findings in the report.  08/15/2020: CTA Chest: 1. Extensive bilateral pulmonary embolism with associated right heart strain. 2. Cholelithiasis.  08/16/2020: [place] Bilateral LE: Findings consistent with age indeterminate deep vein thrombosis  involving the left popliteal vein, and left peroneal veins.   ASSESSMENT & PLAN Jessica Rodgers is a 46 y.o. female for a follow up for bilateral extensive pulmonary emboli and left lower extremity DVT that was diagnosed in June 2022.   #Extensive bilateral pulmonary emboli and LLE DVT: -CTA chest from 08/15/2020 showed extensive bilateral pulmonary embolism with associated right heart strain. Doppler US on 08/16/2020 showed DVT involving the left popliteal vein and left peroneal veins.  --Risk factors include smoking and sedentary lifestyle. Patient continues to smoke occasionally and sits for long periods of time when she works.  --Hypercoagulable workup was negative for any clotting disorders.  --After six months of anticoagulation, we  discussed risks versus benefits of continuing on Xarelto. Since patient reports that she has a sedentary lifestyle and smokes intermittently, we decided to continue on anticoagulation but transition to maintenance dose.  --continue with maintenance dose of Xarelto 10 mg once daily  #Iron deficiency anemia 2/2 GYN bleeding: --Received IV feraheme 510 mg x 2 doses on 12/09/2021 and 12/16/2021 --Labs today show that anemia has resolved with Hgb of 12.5. Iron panel shows improvement with serum iron 76, TIBC 360, saturation 21%. Ferritin pending.  --No need for additional IV iron at this time.  --Continue ferrous sulfate 325 mg once a day with a source of vitamin C. Sent refills.  --Continue to incorporate iron rich foods into diet.  --Continue with scheduled follow ups with gynecologist.    Follow up: -3 months: labs only -6 months: labs and f/u visit  No orders of the defined types were placed in this encounter.  All questions were answered. The patient knows to call the clinic with any problems, questions or concerns.  I have spent a total of 25 minutes minutes of face-to-face and non-face-to-face time, preparing to see the [place] a medically appropriate examination, counseling and educating the patient, ordering tests/meds, documenting clinical information in the electronic health record, and care coordination.   Dede Query PA-C Dept of Hematology and [place] at [facility] Phone: [phone]

## 2022-02-25 ENCOUNTER — Telehealth: Payer: Self-pay

## 2022-02-25 NOTE — Telephone Encounter (Signed)
-----   Message from Lincoln Brigham, PA-C sent at 02/25/2022  2:22 PM EST ----- Please notify patient that iron levels have improved. No need for IV iron. Continue on iron pills.

## 2022-02-25 NOTE — Telephone Encounter (Signed)
LM fo pt with lab results and recommendations.  HB

## 2022-03-31 ENCOUNTER — Other Ambulatory Visit: Payer: Self-pay | Admitting: Internal Medicine

## 2022-04-22 ENCOUNTER — Ambulatory Visit (INDEPENDENT_AMBULATORY_CARE_PROVIDER_SITE_OTHER): Payer: BC Managed Care – PPO | Admitting: Internal Medicine

## 2022-04-22 ENCOUNTER — Encounter: Payer: Self-pay | Admitting: Internal Medicine

## 2022-04-22 ENCOUNTER — Other Ambulatory Visit: Payer: Self-pay

## 2022-04-22 VITALS — BP 126/84 | HR 74 | Temp 98.4°F | Ht 63.0 in | Wt 220.4 lb

## 2022-04-22 DIAGNOSIS — Z2821 Immunization not carried out because of patient refusal: Secondary | ICD-10-CM

## 2022-04-22 DIAGNOSIS — I10 Essential (primary) hypertension: Secondary | ICD-10-CM | POA: Diagnosis not present

## 2022-04-22 DIAGNOSIS — N951 Menopausal and female climacteric states: Secondary | ICD-10-CM | POA: Diagnosis not present

## 2022-04-22 DIAGNOSIS — Z6839 Body mass index (BMI) 39.0-39.9, adult: Secondary | ICD-10-CM

## 2022-04-22 MED ORDER — WEGOVY 1 MG/0.5ML ~~LOC~~ SOAJ: 1.0000 mg | SUBCUTANEOUS | 0 refills | Status: DC

## 2022-04-22 MED ORDER — TRIAMCINOLONE ACETONIDE 0.1 % EX CREA: TOPICAL_CREAM | CUTANEOUS | 0 refills | Status: DC

## 2022-04-22 MED ORDER — VALSARTAN-HYDROCHLOROTHIAZIDE 80-12.5 MG PO TABS: 1.0000 | ORAL_TABLET | Freq: Every day | ORAL | 2 refills | Status: DC

## 2022-04-22 NOTE — Progress Notes (Signed)
I,Victoria T Hamilton,acting as a scribe for Maximino Greenland, MD.,have documented all relevant documentation on the behalf of Maximino Greenland, MD,as directed by  Maximino Greenland, MD while in the presence of Maximino Greenland, MD.    Subjective:     Patient ID: Jessica Rodgers , female    DOB: Oct 15, 1975 , 47 y.o.   MRN: [id number]   Chief Complaint  Patient presents with   Hypertension    HPI  The patient is here today for bp check. She reports compliance with meds. She is currently taking valsartan/hct for HTN. She denies headaches, chest pain and shortness of breath.  She has no specific questions or concerns.   Hypertension This is a chronic problem. The current episode started more than 1 year ago. The problem has been gradually improving since onset. The problem is controlled. Pertinent negatives include no palpitations or shortness of breath. Past treatments include ACE inhibitors, angiotensin blockers and diuretics. The current treatment provides moderate improvement.     Past Medical History:  Diagnosis Date   Gallstones    GERD (gastroesophageal reflux disease)    Hypertension    Pulmonary emboli (HCC)      Family History  Problem Relation Age of Onset   Hypertension Mother    Arthritis Mother    Glaucoma Mother    Other Father        unsure of his health   Breast cancer Maternal Grandmother 58     Current Outpatient Medications:    ferrous sulfate 325 (65 FE) MG EC tablet, Take 1 tablet (325 mg total) by mouth daily with breakfast., Disp: 30 tablet, Rfl: 6   triamcinolone cream (KENALOG) 0.1 %, APPLY TO AFFECTED AREA TWICE DAILY AS NEEDED, Disp: 30 g, Rfl: 0   XARELTO 10 MG TABS tablet, TAKE 1 TABLET BY MOUTH EVERY DAY, Disp: 90 tablet, Rfl: 1   valsartan-hydrochlorothiazide (DIOVAN-HCT) 80-12.5 MG tablet, Take 1 tablet by mouth daily., Disp: 90 tablet, Rfl: 2   No Known Allergies   Review of Systems  Constitutional: Negative.   Respiratory: Negative.   Negative for shortness of breath.   Cardiovascular: Negative.  Negative for palpitations.  Skin:  Positive for rash.  Neurological: Negative.   Psychiatric/Behavioral: Negative.       Today's Vitals   04/22/22 0831  BP: 126/84  Pulse: 74  Temp: 98.4 F (36.9 C)  SpO2: 98%  Weight: 220 lb 6.4 oz (100 kg)  Height: '5\' 3"'$  (1.6 m)   Body mass index is 39.04 kg/m.  Wt Readings from Last 3 Encounters:  04/22/22 220 lb 6.4 oz (100 kg)  02/23/22 220 lb 3.2 oz (99.9 kg)  12/16/21 219 lb 9.6 oz (99.6 kg)    Objective:  Physical Exam Vitals and nursing note reviewed.  Constitutional:      Appearance: Normal appearance.  HENT:     Head: Normocephalic and atraumatic.     Nose:     Comments: Masked     Mouth/Throat:     Comments: Masked  Eyes:     Extraocular Movements: Extraocular movements intact.  Cardiovascular:     Rate and Rhythm: Normal rate and regular rhythm.     Heart sounds: Normal heart sounds.  Pulmonary:     Effort: Pulmonary effort is normal.     Breath sounds: Normal breath sounds.  Musculoskeletal:     Cervical back: Normal range of motion.  Skin:    General: Skin is warm.  Findings: Rash present.     Comments: Tattoo on anterior chest Erythematous macular rash on anterior chest. No vesicular lesions noted  Neurological:     General: No focal deficit present.     Mental Status: She is alert.  Psychiatric:        Mood and Affect: Mood normal.        Behavior: Behavior normal.      Assessment And Plan:     1. Essential hypertension Comments: Chronic, controlled.  She will c/w valsartan/hct. Encouraged to follow low sodium diet. I reviewed Dec 2023 labs from Oncology, no need to recheck BMP. - valsartan-hydrochlorothiazide (DIOVAN-HCT) 80-12.5 MG tablet; Take 1 tablet by mouth daily.  Dispense: 90 tablet; Refill: 2  2. Perimenopausal Comments: She is encouraged to increase her intake of cruciferous veggies. She may benefit from magnesium nightly if  her sleep quality is affected.  3. Influenza vaccination declined  4. Class 2 severe obesity due to excess calories with serious comorbidity and body mass index (BMI) of 39.0 to 39.9 in adult [facility]) Comments: We discussed the use of GLP-1 agonists to assist with her weight loss efforts. Patient has not met goal of at least 5% of body weight loss with comprehensive lifestyle modifications alone in the past 3-6 months. Pharmacotherapy is appropriate to pursue as augmentation. She understands concerns for product shortages and the concerns of starting/stopping in relation to GI distress.  Confirmed patient not pregnant and no personal or family history of medullary thyroid carcinoma (MTC) or Multiple Endocrine Neoplasia syndrome type 2 (MEN 2). Advised patient on common side effects including nausea, diarrhea, dyspepsia, decreased appetite, and fatigue. Counseled patient on reducing meal size and how to titrate medication to minimize side effects. Patient aware to call if intolerable side effects or if experiencing dehydration, abdominal pain, or dizziness. Patient will adhere to dietary modifications and will target at least 150 minutes of moderate intensity exercise weekly.    Patient was given opportunity to ask questions. Patient verbalized understanding of the plan and was able to repeat key elements of the plan. All questions were answered to their satisfaction.   I, Maximino Greenland, MD, have reviewed all documentation for this visit. The documentation on 04/22/22 for the exam, diagnosis, procedures, and orders are all accurate and complete.   IF YOU HAVE BEEN REFERRED TO A SPECIALIST, IT MAY TAKE 1-2 WEEKS TO SCHEDULE/PROCESS THE REFERRAL. IF YOU HAVE NOT HEARD FROM US/SPECIALIST IN TWO WEEKS, PLEASE GIVE [place] A CALL AT [phone].   THE PATIENT IS ENCOURAGED TO PRACTICE SOCIAL DISTANCING DUE TO THE COVID-19 PANDEMIC.

## 2022-04-22 NOTE — Patient Instructions (Signed)
Hypertension, Adult Hypertension is another name for high blood pressure. High blood pressure forces your heart to work harder to pump blood. This can cause problems over time. There are two numbers in a blood pressure reading. There is a top number (systolic) over a bottom number (diastolic). It is best to have a blood pressure that is below 120/80. What are the causes? The cause of this condition is not known. Some other conditions can lead to high blood pressure. What increases the risk? Some lifestyle factors can make you more likely to develop high blood pressure: Smoking. Not getting enough exercise or physical activity. Being overweight. Having too much fat, sugar, calories, or salt (sodium) in your diet. Drinking too much alcohol. Other risk factors include: Having any of these conditions: Heart disease. Diabetes. High cholesterol. Kidney disease. Obstructive sleep apnea. Having a family history of high blood pressure and high cholesterol. Age. The risk increases with age. Stress. What are the signs or symptoms? High blood pressure may not cause symptoms. Very high blood pressure (hypertensive crisis) may cause: Headache. Fast or uneven heartbeats (palpitations). Shortness of breath. Nosebleed. Vomiting or feeling like you may vomit (nauseous). Changes in how you see. Very bad chest pain. Feeling dizzy. Seizures. How is this treated? This condition is treated by making healthy lifestyle changes, such as: Eating healthy foods. Exercising more. Drinking less alcohol. Your doctor may prescribe medicine if lifestyle changes do not help enough and if: Your top number is above 130. Your bottom number is above 80. Your personal target blood pressure may vary. Follow these instructions at home: Eating and drinking  If told, follow the DASH eating plan. To follow this plan: Fill one half of your plate at each meal with fruits and vegetables. Fill one fourth of your plate  at each meal with whole grains. Whole grains include whole-wheat pasta, brown rice, and whole-grain bread. Eat or drink low-fat dairy products, such as skim milk or low-fat yogurt. Fill one fourth of your plate at each meal with low-fat (lean) proteins. Low-fat proteins include fish, chicken without skin, eggs, beans, and tofu. Avoid fatty meat, cured and processed meat, or chicken with skin. Avoid pre-made or processed food. Limit the amount of salt in your diet to less than 1,500 mg each day. Do not drink alcohol if: Your doctor tells you not to drink. You are pregnant, may be pregnant, or are planning to become pregnant. If you drink alcohol: Limit how much you have to: 0-1 drink a day for women. 0-2 drinks a day for men. Know how much alcohol is in your drink. In the [place] one drink equals one 12 oz bottle of beer (355 mL), one 5 oz glass of wine (148 mL), or one 1 oz glass of hard liquor (44 mL). Lifestyle  Work with your doctor to stay at a healthy weight or to lose weight. Ask your doctor what the best weight is for you. Get at least 30 minutes of exercise that causes your heart to beat faster (aerobic exercise) most days of the week. This may include walking, swimming, or biking. Get at least 30 minutes of exercise that strengthens your muscles (resistance exercise) at least 3 days a week. This may include lifting weights or doing Pilates. Do not smoke or use any products that contain nicotine or tobacco. If you need help quitting, ask your doctor. Check your blood pressure at home as told by your doctor. Keep all follow-up visits. Medicines Take over-the-counter and prescription medicines  only as told by your doctor. Follow directions carefully. ?Do not skip doses of blood pressure medicine. The medicine does not work as well if you skip doses. Skipping doses also puts you at risk for problems. ?Ask your doctor about side effects or reactions to medicines that you should watch  for. ?Contact a doctor if: ?You think you are having a reaction to the medicine you are taking. ?You have headaches that keep coming back. ?You feel dizzy. ?You have swelling in your ankles. ?You have trouble with your vision. ?Get help right away if: ?You get a very bad headache. ?You start to feel mixed up (confused). ?You feel weak or numb. ?You feel faint. ?You have very bad pain in your: ?Chest. ?Belly (abdomen). ?You vomit more than once. ?You have trouble breathing. ?These symptoms may be an emergency. Get help right away. Call 911. ?Do not wait to see if the symptoms will go away. ?Do not drive yourself to the hospital. ?Summary ?Hypertension is another name for high blood pressure. ?High blood pressure forces your heart to work harder to pump blood. ?For most people, a normal blood pressure is less than 120/80. ?Making healthy choices can help lower blood pressure. If your blood pressure does not get lower with healthy choices, you may need to take medicine. ?This information is not intended to replace advice given to you by your health care provider. Make sure you discuss any questions you have with your health care provider. ?Document Revised: 12/18/2020 Document Reviewed: 12/18/2020 ?Elsevier Patient Education ? 2023 Elsevier Inc. ? ?

## 2022-05-26 ENCOUNTER — Inpatient Hospital Stay: Payer: BC Managed Care – PPO | Attending: Physician Assistant

## 2022-05-26 ENCOUNTER — Other Ambulatory Visit: Payer: Self-pay

## 2022-05-26 DIAGNOSIS — D500 Iron deficiency anemia secondary to blood loss (chronic): Secondary | ICD-10-CM | POA: Insufficient documentation

## 2022-05-26 DIAGNOSIS — N920 Excessive and frequent menstruation with regular cycle: Secondary | ICD-10-CM | POA: Diagnosis not present

## 2022-05-26 LAB — IRON AND IRON BINDING CAPACITY (CC-WL,HP ONLY)
Iron: 74 ug/dL (ref 28–170)
Saturation Ratios: 19 % (ref 10.4–31.8)
TIBC: 385 ug/dL (ref 250–450)
UIBC: 311 ug/dL (ref 148–442)

## 2022-05-26 LAB — CMP (CANCER CENTER ONLY)
ALT: 13 U/L (ref 0–44)
AST: 20 U/L (ref 15–41)
Albumin: 3.8 g/dL (ref 3.5–5.0)
Alkaline Phosphatase: 47 U/L (ref 38–126)
Anion gap: 5 (ref 5–15)
BUN: 16 mg/dL (ref 6–20)
CO2: 28 mmol/L (ref 22–32)
Calcium: 9 mg/dL (ref 8.9–10.3)
Chloride: 105 mmol/L (ref 98–111)
Creatinine: 1 mg/dL (ref 0.44–1.00)
GFR, Estimated: >60 mL/min (ref >60)
Glucose, Bld: 104 mg/dL — ABNORMAL HIGH (ref 70–99)
Potassium: 3.8 mmol/L (ref 3.5–5.1)
Sodium: 138 mmol/L (ref 135–145)
Total Bilirubin: 0.4 mg/dL (ref 0.3–1.2)
Total Protein: 7.4 g/dL (ref 6.5–8.1)

## 2022-05-26 LAB — CBC WITH DIFFERENTIAL (CANCER CENTER ONLY)
Abs Immature Granulocytes: 0.02 10*3/uL (ref 0.00–0.07)
Basophils Absolute: 0 10*3/uL (ref 0.0–0.1)
Basophils Relative: 0 %
Eosinophils Absolute: 0 10*3/uL (ref 0.0–0.5)
Eosinophils Relative: 1 %
HCT: 36 % (ref 36.0–46.0)
Hemoglobin: 12.2 g/dL (ref 12.0–15.0)
Immature Granulocytes: 0 %
Lymphocytes Relative: 28 %
Lymphs Abs: 2 10*3/uL (ref 0.7–4.0)
MCH: 35.2 pg — ABNORMAL HIGH (ref 26.0–34.0)
MCHC: 33.9 g/dL (ref 30.0–36.0)
MCV: 103.7 fL — ABNORMAL HIGH (ref 80.0–100.0)
Monocytes Absolute: 0.4 10*3/uL (ref 0.1–1.0)
Monocytes Relative: 5 %
Neutro Abs: 4.7 10*3/uL (ref 1.7–7.7)
Neutrophils Relative %: 66 %
Platelet Count: 295 10*3/uL (ref 150–400)
RBC: 3.47 MIL/uL — ABNORMAL LOW (ref 3.87–5.11)
RDW: 11.3 % — ABNORMAL LOW (ref 11.5–15.5)
WBC Count: 7.1 10*3/uL (ref 4.0–10.5)
nRBC: 0 % (ref 0.0–0.2)

## 2022-05-26 LAB — FERRITIN: Ferritin: 32 ng/mL (ref 11–307)

## 2022-05-31 ENCOUNTER — Other Ambulatory Visit: Payer: Self-pay | Admitting: Physician Assistant

## 2022-05-31 ENCOUNTER — Telehealth: Payer: Self-pay

## 2022-05-31 NOTE — Telephone Encounter (Signed)
Pt advised and agreed to plan 

## 2022-05-31 NOTE — Telephone Encounter (Signed)
-----   Message from Lincoln Brigham, PA-C sent at 05/31/2022  1:57 PM EDT ----- Can you notify patient that iron levels are low end of normal. Recommend IV iron x 1 dose to improve levels to prevent anemia.    ----- Message ----- From: Interface, Lab In [facility] Sent: 05/26/2022   9:00 AM EDT To: Lincoln Brigham, PA-C

## 2022-06-02 ENCOUNTER — Telehealth: Payer: Self-pay | Admitting: Pharmacy Technician

## 2022-06-02 NOTE — Telephone Encounter (Signed)
Kristin Bruins note:  Auth Submission: NO AUTH NEEDED Site of care: Site of care: [facility] Payer: [employer]BS Medication & CPT/J Code(s) submitted: Feraheme (ferumoxytol) [id number] Route of submission (phone, fax, portal):  Phone # [phone] Fax # [phone] Auth type: Buy/Bill Units/visits requested: 2 Reference number: [id number] Approval from: 06/01/22 to 10/01/22    Patient will be scheduled as soon as possible

## 2022-06-03 ENCOUNTER — Ambulatory Visit (INDEPENDENT_AMBULATORY_CARE_PROVIDER_SITE_OTHER): Payer: BC Managed Care – PPO

## 2022-06-03 VITALS — BP 117/73 | HR 70 | Temp 97.4°F | Resp 20 | Ht 63.0 in | Wt 224.0 lb

## 2022-06-03 DIAGNOSIS — N920 Excessive and frequent menstruation with regular cycle: Secondary | ICD-10-CM | POA: Diagnosis not present

## 2022-06-03 DIAGNOSIS — D500 Iron deficiency anemia secondary to blood loss (chronic): Secondary | ICD-10-CM

## 2022-06-03 MED ORDER — SODIUM CHLORIDE 0.9 % IV SOLN
510.0000 mg | Freq: Once | INTRAVENOUS | Status: AC
  Administered 2022-06-03: 510 mg via INTRAVENOUS
  Filled 2022-06-03: qty 17

## 2022-06-03 NOTE — Progress Notes (Signed)
Diagnosis: Iron Deficiency Anemia  Provider:  Marshell Garfinkel MD  Procedure: Infusion  IV Type: Peripheral, IV Location: R Antecubital  Feraheme (Ferumoxytol), Dose: 510 mg  Infusion Start Time: 1440  Infusion Stop Time: [id number]  Post Infusion IV Care: Peripheral IV Discontinued  Discharge: Condition: Good, Destination: Home . AVS Declined  Performed by:  Fraser Din [place], RN

## 2022-06-10 ENCOUNTER — Ambulatory Visit (INDEPENDENT_AMBULATORY_CARE_PROVIDER_SITE_OTHER): Payer: BC Managed Care – PPO | Admitting: *Deleted

## 2022-06-10 VITALS — BP 124/86 | HR 65 | Temp 97.4°F | Resp 18 | Ht 63.0 in | Wt 223.6 lb

## 2022-06-10 DIAGNOSIS — D500 Iron deficiency anemia secondary to blood loss (chronic): Secondary | ICD-10-CM

## 2022-06-10 DIAGNOSIS — N920 Excessive and frequent menstruation with regular cycle: Secondary | ICD-10-CM

## 2022-06-10 MED ORDER — SODIUM CHLORIDE 0.9 % IV SOLN
510.0000 mg | Freq: Once | INTRAVENOUS | Status: AC
  Administered 2022-06-10: 510 mg via INTRAVENOUS
  Filled 2022-06-10: qty 17

## 2022-06-10 NOTE — Progress Notes (Signed)
Diagnosis: Iron Deficiency Anemia  Provider:  Marshell Garfinkel MD  Procedure: Infusion  IV Type: Peripheral, IV Location: R Antecubital  Feraheme (Ferumoxytol), Dose: 510 mg  Infusion Start Time: [id number]  Infusion Stop Time: 1457 pm  Post Infusion IV Care: Observation period completed and Peripheral IV Discontinued  Discharge: Condition: Good, Destination: Home . AVS Provided and AVS Declined  Performed by:  Oren Beckmann, RN

## 2022-07-02 ENCOUNTER — Other Ambulatory Visit: Payer: Self-pay | Admitting: Internal Medicine

## 2022-08-23 ENCOUNTER — Other Ambulatory Visit: Payer: Self-pay | Admitting: Physician Assistant

## 2022-08-23 DIAGNOSIS — Z86718 Personal history of other venous thrombosis and embolism: Secondary | ICD-10-CM

## 2022-08-23 DIAGNOSIS — D500 Iron deficiency anemia secondary to blood loss (chronic): Secondary | ICD-10-CM

## 2022-08-24 ENCOUNTER — Inpatient Hospital Stay (HOSPITAL_BASED_OUTPATIENT_CLINIC_OR_DEPARTMENT_OTHER): Payer: BC Managed Care – PPO | Admitting: Physician Assistant

## 2022-08-24 ENCOUNTER — Telehealth: Payer: Self-pay

## 2022-08-24 ENCOUNTER — Inpatient Hospital Stay: Payer: BC Managed Care – PPO | Attending: Physician Assistant

## 2022-08-24 ENCOUNTER — Other Ambulatory Visit: Payer: Self-pay

## 2022-08-24 VITALS — BP 121/88 | HR 65 | Temp 97.7°F | Resp 13 | Wt 220.8 lb

## 2022-08-24 DIAGNOSIS — Z86711 Personal history of pulmonary embolism: Secondary | ICD-10-CM | POA: Diagnosis present

## 2022-08-24 DIAGNOSIS — D500 Iron deficiency anemia secondary to blood loss (chronic): Secondary | ICD-10-CM | POA: Diagnosis not present

## 2022-08-24 DIAGNOSIS — N920 Excessive and frequent menstruation with regular cycle: Secondary | ICD-10-CM | POA: Insufficient documentation

## 2022-08-24 DIAGNOSIS — Z7901 Long term (current) use of anticoagulants: Secondary | ICD-10-CM | POA: Insufficient documentation

## 2022-08-24 DIAGNOSIS — D508 Other iron deficiency anemias: Secondary | ICD-10-CM | POA: Diagnosis not present

## 2022-08-24 DIAGNOSIS — Z86718 Personal history of other venous thrombosis and embolism: Secondary | ICD-10-CM

## 2022-08-24 LAB — CMP (CANCER CENTER ONLY)
ALT: 13 U/L (ref 0–44)
AST: 19 U/L (ref 15–41)
Albumin: 4.1 g/dL (ref 3.5–5.0)
Alkaline Phosphatase: 41 U/L (ref 38–126)
Anion gap: 6 (ref 5–15)
BUN: 17 mg/dL (ref 6–20)
CO2: 25 mmol/L (ref 22–32)
Calcium: 9.4 mg/dL (ref 8.9–10.3)
Chloride: 107 mmol/L (ref 98–111)
Creatinine: 1.06 mg/dL — ABNORMAL HIGH (ref 0.44–1.00)
GFR, Estimated: >60 mL/min (ref >60)
Glucose, Bld: 102 mg/dL — ABNORMAL HIGH (ref 70–99)
Potassium: 4.4 mmol/L (ref 3.5–5.1)
Sodium: 138 mmol/L (ref 135–145)
Total Bilirubin: 0.4 mg/dL (ref 0.3–1.2)
Total Protein: 7.7 g/dL (ref 6.5–8.1)

## 2022-08-24 LAB — IRON AND IRON BINDING CAPACITY (CC-WL,HP ONLY)
Iron: 117 ug/dL (ref 28–170)
Saturation Ratios: 33 % — ABNORMAL HIGH (ref 10.4–31.8)
TIBC: 353 ug/dL (ref 250–450)
UIBC: 236 ug/dL (ref 148–442)

## 2022-08-24 LAB — CBC WITH DIFFERENTIAL (CANCER CENTER ONLY)
Abs Immature Granulocytes: 0.02 10*3/uL (ref 0.00–0.07)
Basophils Absolute: 0 10*3/uL (ref 0.0–0.1)
Basophils Relative: 1 %
Eosinophils Absolute: 0.1 10*3/uL (ref 0.0–0.5)
Eosinophils Relative: 1 %
HCT: 38.6 % (ref 36.0–46.0)
Hemoglobin: 13.1 g/dL (ref 12.0–15.0)
Immature Granulocytes: 0 %
Lymphocytes Relative: 26 %
Lymphs Abs: 1.9 10*3/uL (ref 0.7–4.0)
MCH: 35.8 pg — ABNORMAL HIGH (ref 26.0–34.0)
MCHC: 33.9 g/dL (ref 30.0–36.0)
MCV: 105.5 fL — ABNORMAL HIGH (ref 80.0–100.0)
Monocytes Absolute: 0.4 10*3/uL (ref 0.1–1.0)
Monocytes Relative: 6 %
Neutro Abs: 4.8 10*3/uL (ref 1.7–7.7)
Neutrophils Relative %: 66 %
Platelet Count: 272 10*3/uL (ref 150–400)
RBC: 3.66 MIL/uL — ABNORMAL LOW (ref 3.87–5.11)
RDW: 11.8 % (ref 11.5–15.5)
WBC Count: 7.2 10*3/uL (ref 4.0–10.5)
nRBC: 0 % (ref 0.0–0.2)

## 2022-08-24 LAB — FERRITIN: Ferritin: 85 ng/mL (ref 11–307)

## 2022-08-24 MED ORDER — FERROUS SULFATE 325 (65 FE) MG PO TBEC: 325.0000 mg | DELAYED_RELEASE_TABLET | Freq: Every day | ORAL | 6 refills | Status: DC

## 2022-08-24 NOTE — Telephone Encounter (Signed)
Pt advised with VU 

## 2022-08-24 NOTE — Telephone Encounter (Signed)
-----   Message from Briant Cedar, PA-C sent at 08/24/2022  2:46 PM EDT ----- Please notify patient that iron levels are normal. No need for IV iron at this time.

## 2022-08-24 NOTE — Progress Notes (Signed)
[facility] [phone]   [phone]  PROGRESS NOTE  Patient Care Team: Dorothyann Peng, MD as PCP - General (Internal Medicine) Jodelle Red, MD as PCP - Cardiology (Cardiology) Philip Aspen, DO as Consulting Physician (Obstetrics and Gynecology)  Hematological/Oncological History 1) 08/15/2020-08/18/2020: Admitted after presenting with increasing shortness of breath. CTA chest showed extensive bilateral pulmonary emboli with associated right heart strain. Doppler US revealed age indeterminate DVT involving the left popliteal vein and let peroneal veins.Treated with heparin drip and discharged home on Xarelto.   2) 10/28/2020: Establish care with Georga Kaufmann PA-C  3) 12/09/2021 and 12/16/2021: Received IV feraheme 510 mg weekly x 2 doses  4) 06/03/2022-06/10/2022: Received IV feraheme 510 mg weekly x 2 doses.    CHIEF COMPLAINTS/PURPOSE OF CONSULTATION:  Bilateral Pulmonary Emboli and Left LE DVT Iron deficiency anemia  HISTORY OF PRESENTING ILLNESS:  Jessica Rodgers 47 y.o. female who presents to the clinic for a follow-up for history of pulmonary emboli and DVT along with iron deficiency anemia.   On exam today, Ms. Hufnagle reports her energy levels are overall stable even after her most recent IV iron infusions in March 2024. She is able to complete all her ADLs on her own. She is compliant with taking her iron supplementation as directed. She is trying to loose weight and increase her activity. She is now going back into work which prevents her from being sedentary. She denies nausea, vomiting or abdominal pain. Her bowel habits are unchanged. She reports her menstrual cycles are back to her baseline before starting her Xarelto therapy. Generally, her menstrual cycles occur once a month and last 5 days with 2-3 days of heavy bleeding. She denies any other signs of bleeding or bruising. She denies fevers, chills, night sweats, shortness of  breath, chest pain or cough. She has no other complaints. Rest of the 10 point ROS is below.   MEDICAL HISTORY:  Past Medical History:  Diagnosis Date   Gallstones    GERD (gastroesophageal reflux disease)    Hypertension    Pulmonary emboli (HCC)     SURGICAL HISTORY: Past Surgical History:  Procedure Laterality Date   BREAST EXCISIONAL BIOPSY Right 04/07/2017   PASH   RADIOACTIVE SEED GUIDED EXCISIONAL BREAST BIOPSY Right 04/07/2017   Procedure: RIGHT RADIOACTIVE SEED GUIDED EXCISIONAL BREAST BIOPSY;  Surgeon: Emelia Loron, MD;  Location: [facility];  Service: General;  Laterality: Right;   TUBAL LIGATION  1997    SOCIAL HISTORY: Social History   Socioeconomic History   Marital status: Divorced    Spouse name: Not on file   Number of children: Not on file   Years of education: Not on file   Highest education level: Not on file  Occupational History   Not on file  Tobacco Use   Smoking status: Former    Packs/day: 0.50    Years: 18.00    Additional pack years: 0.00    Total pack years: 9.00    Types: Cigarettes    Start date: 02/13/2003    Quit date: 08/09/2020    Years since quitting: 2.0   Smokeless tobacco: Never  Vaping Use   Vaping Use: Never used  Substance and Sexual Activity   Alcohol use: Yes    Alcohol/week: 4.0 standard drinks of alcohol    Types: 3 Glasses of wine, 1 Standard drinks or equivalent per week   Drug use: No   Sexual activity: Not on file  Other Topics Concern  Not on file  Social History Narrative   Not on file   Social Determinants of Health   Financial Resource Strain: Not on file  Food Insecurity: Not on file  Transportation Needs: Not on file  Physical Activity: Not on file  Stress: Not on file  Social Connections: Not on file  Intimate Partner Violence: Not on file    FAMILY HISTORY: Family History  Problem Relation Age of Onset   Hypertension Mother    Arthritis Mother    Glaucoma Mother     Other Father        unsure of his health   Breast cancer Maternal Grandmother 41    ALLERGIES:  has No Known Allergies.  MEDICATIONS:  Current Outpatient Medications  Medication Sig Dispense Refill   Semaglutide-Weight Management (WEGOVY) 1 MG/0.5ML SOAJ Inject 1 mg into the skin once a week. (Patient not taking: Reported on 08/24/2022) 2 mL 0   valsartan-hydrochlorothiazide (DIOVAN-HCT) 80-12.5 MG tablet Take 1 tablet by mouth daily. 90 tablet 2   XARELTO 10 MG TABS tablet TAKE 1 TABLET BY MOUTH EVERY DAY 90 tablet 1   ferrous sulfate 325 (65 FE) MG EC tablet Take 1 tablet (325 mg total) by mouth daily with breakfast. (Patient not taking: Reported on 08/24/2022) 30 tablet 6   triamcinolone cream (KENALOG) 0.1 % APPLY TO AFFECTED AREA TWICE A DAY AS NEEDED (Patient not taking: Reported on 08/24/2022) 30 g 0   No current facility-administered medications for this visit.    REVIEW OF SYSTEMS:   Constitutional: ( - ) fevers, ( - )  chills , ( - ) night sweats Eyes: ( - ) blurriness of vision, ( - ) double vision, ( - ) watery eyes Ears, nose, mouth, throat, and face: ( - ) mucositis, ( - ) sore throat Respiratory: ( - ) cough, ( - ) dyspnea, ( - ) wheezes Cardiovascular: ( - ) palpitation, ( - ) chest discomfort, ( - ) lower extremity swelling Gastrointestinal:  ( - ) nausea, ( - ) heartburn, ( - ) change in bowel habits Skin: ( - ) abnormal skin rashes Lymphatics: ( - ) new lymphadenopathy, ( - ) easy bruising Neurological: ( - ) numbness, ( - ) tingling, ( - ) new weaknesses Behavioral/Psych: ( - ) mood change, ( - ) new changes  All other systems were reviewed with the patient and are negative.  PHYSICAL EXAMINATION: ECOG PERFORMANCE STATUS: 0 - Asymptomatic  Vitals:   08/24/22 0827  BP: 121/88  Pulse: 65  Resp: 13  Temp: 97.7 F (36.5 C)  SpO2: 100%    Filed Weights   08/24/22 0827  Weight: 220 lb 12.8 oz (100.2 kg)     GENERAL: well appearing female in NAD  SKIN:  skin color, texture, turgor are normal, no rashes or significant lesions EYES: conjunctiva are pink and non-injected, sclera clear LUNGS: clear to auscultation and percussion with normal breathing effort HEART: regular rate & rhythm and no murmurs and no lower extremity edema Musculoskeletal: no cyanosis of digits and no clubbing  PSYCH: alert & oriented x 3, fluent speech NEURO: no focal motor/sensory deficits  LABORATORY DATA:  I have reviewed the data as listed    Latest Ref Rng & Units 08/24/2022    7:57 AM 05/26/2022    8:43 AM 02/23/2022    9:30 AM  CBC  WBC 4.0 - 10.5 K/uL 7.2  7.1  6.2   Hemoglobin 12.0 - 15.0 g/dL 16.1  09.6  12.5   Hematocrit 36.0 - 46.0 % 38.6  36.0  36.9   Platelets 150 - 400 K/uL 272  295  279        Latest Ref Rng & Units 05/26/2022    8:43 AM 02/23/2022    9:30 AM 12/04/2021    8:35 AM  CMP  Glucose 70 - 99 mg/dL 166  063  016   BUN 6 - 20 mg/dL 16  12  16    Creatinine 0.44 - 1.00 mg/dL 0.10  9.32  3.55   Sodium 135 - 145 mmol/L 138  138  139   Potassium 3.5 - 5.1 mmol/L 3.8  4.0  4.1   Chloride 98 - 111 mmol/L 105  102  107   CO2 22 - 32 mmol/L 28  30  28    Calcium 8.9 - 10.3 mg/dL 9.0  9.8  8.9   Total Protein 6.5 - 8.1 g/dL 7.4  7.1  7.0   Total Bilirubin 0.3 - 1.2 mg/dL 0.4  0.3  0.4   Alkaline Phos 38 - 126 U/L 47  43  47   AST 15 - 41 U/L 20  22  20    ALT 0 - 44 U/L 13  16  14     RADIOGRAPHIC STUDIES: I have personally reviewed the radiological images as listed and agreed with the findings in the report.  08/15/2020: CTA Chest: 1. Extensive bilateral pulmonary embolism with associated right heart strain. 2. Cholelithiasis.  08/16/2020: [place] Bilateral LE: Findings consistent with age indeterminate deep vein thrombosis  involving the left popliteal vein, and left peroneal veins.   ASSESSMENT & PLAN Ireland Follman is a 47 y.o. female for a follow up for bilateral extensive pulmonary emboli and left lower extremity DVT that  was diagnosed in June 2022.   #Extensive bilateral pulmonary emboli and LLE DVT: -CTA chest from 08/15/2020 showed extensive bilateral pulmonary embolism with associated right heart strain. Doppler US on 08/16/2020 showed DVT involving the left popliteal vein and left peroneal veins.  --Risk factors include smoking and sedentary lifestyle. Patient continues to smoke occasionally and sits for long periods of time when she works.  --Hypercoagulable workup was negative for any clotting disorders.  --After six months of anticoagulation, we discussed risks versus benefits of continuing on Xarelto. Since patient reports that she has a sedentary lifestyle and smokes intermittently, we decided to continue on anticoagulation but transition to maintenance dose.  --continue with maintenance dose of Xarelto 10 mg once daily  #Iron deficiency anemia 2/2 GYN bleeding: --Last received IV feraheme 510 mg x 2 doses on 06/03/2022 and 06/10/2022 --Labs today show that anemia has resolved with Hgb of 13.1. Iron panel shows iron 117, TIBC 353, saturation 33%, ferritin pending.  --No need for additional IV iron at this time.  --Continue ferrous sulfate 325 mg once a day with a source of vitamin C.   --Continue to incorporate iron rich foods into diet.  --Continue with scheduled follow ups with gynecologist.    Follow up: -3 months: labs only -6 months: labs and f/u visit  No orders of the defined types were placed in this encounter.  All questions were answered. The patient knows to call the clinic with any problems, questions or concerns.  I have spent a total of 25 minutes minutes of face-to-face and non-face-to-face time, preparing to see the patient,performing a medically appropriate examination, counseling and educating the patient, ordering tests/meds, documenting clinical information in the electronic health record, and  care coordination.   Georga Kaufmann PA-C Dept of Hematology and Oncology [facility]  [facility] at [facility] Phone: [phone]

## 2022-09-27 ENCOUNTER — Other Ambulatory Visit: Payer: Self-pay | Admitting: Internal Medicine

## 2022-10-27 ENCOUNTER — Ambulatory Visit (INDEPENDENT_AMBULATORY_CARE_PROVIDER_SITE_OTHER): Payer: BC Managed Care – PPO | Admitting: Internal Medicine

## 2022-10-27 ENCOUNTER — Encounter: Payer: Self-pay | Admitting: Internal Medicine

## 2022-10-27 VITALS — BP 110/82 | HR 70 | Temp 98.6°F | Ht 63.0 in | Wt 221.0 lb

## 2022-10-27 DIAGNOSIS — D6869 Other thrombophilia: Secondary | ICD-10-CM

## 2022-10-27 DIAGNOSIS — Z6839 Body mass index (BMI) 39.0-39.9, adult: Secondary | ICD-10-CM

## 2022-10-27 DIAGNOSIS — N951 Menopausal and female climacteric states: Secondary | ICD-10-CM | POA: Insufficient documentation

## 2022-10-27 DIAGNOSIS — F419 Anxiety disorder, unspecified: Secondary | ICD-10-CM | POA: Diagnosis not present

## 2022-10-27 DIAGNOSIS — Z0000 Encounter for general adult medical examination without abnormal findings: Secondary | ICD-10-CM | POA: Diagnosis not present

## 2022-10-27 DIAGNOSIS — I10 Essential (primary) hypertension: Secondary | ICD-10-CM | POA: Diagnosis not present

## 2022-10-27 DIAGNOSIS — Z86711 Personal history of pulmonary embolism: Secondary | ICD-10-CM

## 2022-10-27 NOTE — Assessment & Plan Note (Signed)
She is encouraged to strive for BMI less than 30 to decrease cardiac risk. Advised to aim for at least 150 minutes of exercise per week.  

## 2022-10-27 NOTE — Assessment & Plan Note (Signed)
Chronic, slight diastolic elevation noted. She will c/w valsartan/hct 80/12.5mg  daily. EKG performed, NSR w/o acute changes. She is encouraged to follow low sodium diet. She will f/u in six months for re-evaluation.

## 2022-10-27 NOTE — Assessment & Plan Note (Signed)

## 2022-10-27 NOTE — Progress Notes (Signed)
I,[name], CMA,acting as a Neurosurgeon for [name], MD.,have documented all relevant documentation on the behalf of [name], MD,as directed by  [name], MD while in the presence of [name], MD.  Subjective:    Patient ID: Jessica Rodgers , female    DOB: Oct 19, 1975 , 47 y.o.   MRN: [id number]  Chief Complaint  Patient presents with   Annual Exam   Hypertension    HPI  She is here today for a full physical examination. She is followed by GYN, Rueben Bash for her pelvic exams. She reports compliance with meds. She denies headaches, chest pain and palpitations.   She has not had any hospitalizations since her last visit.   Hypertension This is a chronic problem. The current episode started more than 1 year ago. The problem has been gradually improving since onset. The problem is controlled. Pertinent negatives include no blurred vision. Risk factors for coronary artery disease include post-menopausal state. The current treatment provides moderate improvement. Compliance problems include exercise.  There is no history of kidney disease or CAD/MI.     Past Medical History:  Diagnosis Date   Gallstones    GERD (gastroesophageal reflux disease)    Hypertension    Pulmonary emboli (HCC)      Family History  Problem Relation Age of Onset   Hypertension Mother    Arthritis Mother    Glaucoma Mother    Other Father        unsure of his health   Breast cancer Maternal Grandmother 69     Current Outpatient Medications:    ferrous sulfate 325 (65 FE) MG EC tablet, Take 1 tablet (325 mg total) by mouth daily with breakfast., Disp: 30 tablet, Rfl: 6   Semaglutide-Weight Management (WEGOVY) 1 MG/0.5ML SOAJ, Inject 1 mg into the skin once a week. (Patient not taking: Reported on 08/24/2022), Disp: 2 mL, Rfl: 0   valsartan-hydrochlorothiazide (DIOVAN-HCT) 80-12.5 MG tablet, Take 1 tablet by mouth daily., Disp: 90 tablet, Rfl: 2   XARELTO 10 MG  TABS tablet, TAKE 1 TABLET BY MOUTH EVERY DAY, Disp: 90 tablet, Rfl: 1   triamcinolone cream (KENALOG) 0.1 %, APPLY TO AFFECTED AREA TWICE A DAY AS NEEDED (Patient not taking: Reported on 08/24/2022), Disp: 30 g, Rfl: 0   No Known Allergies    The patient states she uses none for birth control. Patient's last menstrual period was 10/26/2022 (exact date).. Negative for Dysmenorrhea. Negative for: breast discharge, breast lump(s), breast pain and breast self exam. Associated symptoms include abnormal vaginal bleeding. Pertinent negatives include abnormal bleeding (hematology), anxiety, decreased libido, depression, difficulty falling sleep, dyspareunia, history of infertility, nocturia, sexual dysfunction, sleep disturbances, urinary incontinence, urinary urgency, vaginal discharge and vaginal itching. Diet regular.The patient states her exercise level is  intermittent.   . The patient's tobacco use is:  Social History   Tobacco Use  Smoking Status Former   Current packs/day: 0.00   Average packs/day: 0.5 packs/day for 18.0 years (9.0 ttl pk-yrs)   Types: Cigarettes   Start date: 02/13/2003   Quit date: 08/09/2020   Years since quitting: 2.2  Smokeless Tobacco Never  . She has been exposed to passive smoke. The patient's alcohol use is:  Social History   Substance and Sexual Activity  Alcohol Use Yes   Alcohol/week: 4.0 standard drinks of alcohol   Types: 3 Glasses of wine, 1 Standard drinks or equivalent per week    Review of Systems  Constitutional: Negative.   HENT: Negative.    Eyes: Negative.  Negative for blurred vision.  Respiratory: Negative.    Cardiovascular: Negative.   Gastrointestinal: Negative.   Endocrine: Negative.   Genitourinary: Negative.   Musculoskeletal: Negative.   Skin: Negative.   Allergic/Immunologic: Negative.   Neurological: Negative.   Hematological: Negative.   Psychiatric/Behavioral:  The patient is nervous/anxious.      Today's Vitals    10/27/22 0841  BP: 110/82  Pulse: 70  Temp: 98.6 F (37 C)  SpO2: 98%  Weight: 221 lb (100.2 kg)  Height: 5\' 3"  (1.6 m)   Body mass index is 39.15 kg/m.  Wt Readings from Last 3 Encounters:  10/27/22 221 lb (100.2 kg)  08/24/22 220 lb 12.8 oz (100.2 kg)  06/10/22 223 lb 9.6 oz (101.4 kg)    The 10-year ASCVD risk score (Arnett DK, et al., 2019) is: 0.8%   Values used to calculate the score:     Age: 1 years     Sex: Female     Is Non-Hispanic African American: Yes     Diabetic: No     Tobacco smoker: No     Systolic Blood Pressure: 110 mmHg     Is BP treated: Yes     HDL Cholesterol: 90 mg/dL     Total Cholesterol: 205 mg/dL   Objective:  Physical Exam Vitals and nursing note reviewed.  Constitutional:      Appearance: Normal appearance. She is obese.  HENT:     Head: Normocephalic and atraumatic.     Right Ear: Tympanic membrane, ear canal and external ear normal.     Left Ear: Tympanic membrane, ear canal and external ear normal.     Nose: Nose normal.     Mouth/Throat:     Mouth: Mucous membranes are moist.     Pharynx: Oropharynx is clear.  Eyes:     Extraocular Movements: Extraocular movements intact.     Conjunctiva/sclera: Conjunctivae normal.     Pupils: Pupils are equal, round, and reactive to light.  Cardiovascular:     Rate and Rhythm: Normal rate and regular rhythm.     Pulses: Normal pulses.     Heart sounds: Normal heart sounds.  Pulmonary:     Effort: Pulmonary effort is normal.     Breath sounds: Normal breath sounds.  Chest:  Breasts:    Tanner Score is 5.     Right: Normal.     Left: Normal.     Comments: Tattoo anterior chest Abdominal:     General: Abdomen is flat. Bowel sounds are normal.     Palpations: Abdomen is soft.  Genitourinary:    Comments: deferred Musculoskeletal:        General: Normal range of motion.     Cervical back: Normal range of motion and neck supple.  Skin:    General: Skin is warm and dry.  Neurological:      General: No focal deficit present.     Mental Status: She is alert and oriented to person, place, and time.  Psychiatric:        Mood and Affect: Mood normal.        Behavior: Behavior normal.         Assessment And Plan:     Encounter for general adult medical examination w/o abnormal findings Assessment & Plan: A full exam was performed.   Importance of monthly self breast exams was discussed with the patient. PATIENT IS ADVISED TO  GET 30-45 MINUTES REGULAR EXERCISE NO LESS THAN FOUR TO FIVE DAYS PER WEEK - BOTH WEIGHTBEARING EXERCISES AND AEROBIC ARE RECOMMENDED.  PATIENT IS ADVISED TO FOLLOW A HEALTHY DIET WITH AT LEAST SIX FRUITS/VEGGIES PER DAY, DECREASE INTAKE OF RED MEAT, AND TO INCREASE FISH INTAKE TO TWO DAYS PER WEEK.  MEATS/FISH SHOULD NOT BE FRIED, BAKED OR BROILED IS PREFERABLE.  IT IS ALSO IMPORTANT TO CUT BACK ON YOUR SUGAR INTAKE. PLEASE AVOID ANYTHING WITH ADDED SUGAR, CORN SYRUP OR OTHER SWEETENERS. IF YOU MUST USE A SWEETENER, YOU CAN TRY STEVIA. IT IS ALSO IMPORTANT TO AVOID ARTIFICIALLY SWEETENERS AND DIET BEVERAGES. LASTLY, I SUGGEST WEARING SPF 50 SUNSCREEN ON EXPOSED PARTS AND ESPECIALLY WHEN IN THE DIRECT SUNLIGHT FOR AN EXTENDED PERIOD OF TIME.  PLEASE AVOID FAST FOOD RESTAURANTS AND INCREASE YOUR WATER INTAKE.   Orders: -     Lipid panel -     TSH  Essential hypertension Assessment & Plan: Chronic, slight diastolic elevation noted. She will c/w valsartan/hct 80/12.5mg  daily. EKG performed, NSR w/o acute changes. She is encouraged to follow low sodium diet. She will f/u in six months for re-evaluation.   Orders: -     EKG 12-Lead  Perimenopausal Assessment & Plan: She is now having irregular cycles. She is also followed by GYN. She should follow clean diet , free of processed foods. OTC supplements Remifemin or Estroven may be helpful for her.    Anxiety Assessment & Plan: She agrees to try magnesium glycinate nightly. Her sx are likely related to  perimenopause. She will let me know if her sx persist.    Acquired thrombophilia (HCC) Assessment & Plan: She is currently on Xarelto, due to h/o PE. She is also followed by Hematology.    Class 2 severe obesity due to excess calories with serious comorbidity and body mass index (BMI) of 39.0 to 39.9 in adult [facility]) Assessment & Plan: She is encouraged to strive for BMI less than 30 to decrease cardiac risk. Advised to aim for at least 150 minutes of exercise per week.    History of pulmonary embolism  She is encouraged to strive for BMI less than 30 to decrease cardiac risk. Advised to aim for at least 150 minutes of exercise per week.    Return for 1 year HM, 6 month bpc. Patient was given opportunity to ask questions. Patient verbalized understanding of the plan and was able to repeat key elements of the plan. All questions were answered to their satisfaction.   I, [name], MD, have reviewed all documentation for this visit. The documentation on 10/27/22 for the exam, diagnosis, procedures, and orders are all accurate and complete.

## 2022-10-27 NOTE — Patient Instructions (Addendum)
Magnesium glycinate, one to two capsules nightly  Health Maintenance, Female Adopting a healthy lifestyle and getting preventive care are important in promoting health and wellness. Ask your health care provider about: The right schedule for you to have regular tests and exams. Things you can do on your own to prevent diseases and keep yourself healthy. What should I know about diet, weight, and exercise? Eat a healthy diet  Eat a diet that includes plenty of vegetables, fruits, low-fat dairy products, and lean protein. Do not eat a lot of foods that are high in solid fats, added sugars, or sodium. Maintain a healthy weight Body mass index (BMI) is used to identify weight problems. It estimates body fat based on height and weight. Your health care provider can help determine your BMI and help you achieve or maintain a healthy weight. Get regular exercise Get regular exercise. This is one of the most important things you can do for your health. Most adults should: Exercise for at least 150 minutes each week. The exercise should increase your heart rate and make you sweat (moderate-intensity exercise). Do strengthening exercises at least twice a week. This is in addition to the moderate-intensity exercise. Spend less time sitting. Even light physical activity can be beneficial. Watch cholesterol and blood lipids Have your blood tested for lipids and cholesterol at 49 years of age, then have this test every 5 years. Have your cholesterol levels checked more often if: Your lipid or cholesterol levels are high. You are older than 49 years of age. You are at high risk for heart disease. What should I know about cancer screening? Depending on your health history and family history, you may need to have cancer screening at various ages. This may include screening for: Breast cancer. Cervical cancer. Colorectal cancer. Skin cancer. Lung cancer. What should I know about heart disease, diabetes,  and high blood pressure? Blood pressure and heart disease High blood pressure causes heart disease and increases the risk of stroke. This is more likely to develop in people who have high blood pressure readings or are overweight. Have your blood pressure checked: Every 3-5 years if you are 36-41 years of age. Every year if you are 22 years old or older. Diabetes Have regular diabetes screenings. This checks your fasting blood sugar level. Have the screening done: Once every three years after age 98 if you are at a normal weight and have a low risk for diabetes. More often and at a younger age if you are overweight or have a high risk for diabetes. What should I know about preventing infection? Hepatitis B If you have a higher risk for hepatitis B, you should be screened for this virus. Talk with your health care provider to find out if you are at risk for hepatitis B infection. Hepatitis C Testing is recommended for: Everyone born from [birth year] through 1967. Anyone with known risk factors for hepatitis C. Sexually transmitted infections (STIs) Get screened for STIs, including gonorrhea and chlamydia, if: You are sexually active and are younger than 49 years of age. You are older than 49 years of age and your health care provider tells you that you are at risk for this type of infection. Your sexual activity has changed since you were last screened, and you are at increased risk for chlamydia or gonorrhea. Ask your health care provider if you are at risk. Ask your health care provider about whether you are at high risk for HIV. Your health care provider may  recommend a prescription medicine to help prevent HIV infection. If you choose to take medicine to prevent HIV, you should first get tested for HIV. You should then be tested every 3 months for as long as you are taking the medicine. Pregnancy If you are about to stop having your period (premenopausal) and you may become pregnant, seek  counseling before you get pregnant. Take 400 to 800 micrograms (mcg) of folic acid every day if you become pregnant. Ask for birth control (contraception) if you want to prevent pregnancy. Osteoporosis and menopause Osteoporosis is a disease in which the bones lose minerals and strength with aging. This can result in bone fractures. If you are 62 years old or older, or if you are at risk for osteoporosis and fractures, ask your health care provider if you should: Be screened for bone loss. Take a calcium or vitamin D supplement to lower your risk of fractures. Be given hormone replacement therapy (HRT) to treat symptoms of menopause. Follow these instructions at home: Alcohol use Do not drink alcohol if: Your health care provider tells you not to drink. You are pregnant, may be pregnant, or are planning to become pregnant. If you drink alcohol: Limit how much you have to: 0-1 drink a day. Know how much alcohol is in your drink. In the [place] one drink equals one 12 oz bottle of beer (355 mL), one 5 oz glass of wine (148 mL), or one 1 oz glass of hard liquor (44 mL). Lifestyle Do not use any products that contain nicotine or tobacco. These products include cigarettes, chewing tobacco, and vaping devices, such as e-cigarettes. If you need help quitting, ask your health care provider. Do not use street drugs. Do not share needles. Ask your health care provider for help if you need support or information about quitting drugs. General instructions Schedule regular health, dental, and eye exams. Stay current with your vaccines. Tell your health care provider if: You often feel depressed. You have ever been abused or do not feel safe at home. Summary Adopting a healthy lifestyle and getting preventive care are important in promoting health and wellness. Follow your health care provider's instructions about healthy diet, exercising, and getting tested or screened for diseases. Follow your  health care provider's instructions on monitoring your cholesterol and blood pressure. This information is not intended to replace advice given to you by your health care provider. Make sure you discuss any questions you have with your health care provider. Document Revised: 07/21/2020 Document Reviewed: 07/21/2020 Elsevier Patient Education  2024 [employer].

## 2022-10-28 LAB — LIPID PANEL
Chol/HDL Ratio: 2.3 ratio (ref 0.0–4.4)
Cholesterol, Total: 205 mg/dL — ABNORMAL HIGH (ref 100–199)
HDL: 90 mg/dL (ref >39)
LDL Chol Calc (NIH): 99 mg/dL (ref 0–99)
Triglycerides: 94 mg/dL (ref 0–149)
VLDL Cholesterol Cal: 16 mg/dL (ref 5–40)

## 2022-10-28 LAB — TSH: TSH: 1.21 u[IU]/mL (ref 0.450–4.500)

## 2022-10-30 DIAGNOSIS — D6869 Other thrombophilia: Secondary | ICD-10-CM | POA: Insufficient documentation

## 2022-10-30 NOTE — Assessment & Plan Note (Signed)
She is currently on Xarelto, due to h/o PE. She is also followed by Hematology.

## 2022-10-30 NOTE — Assessment & Plan Note (Signed)
She is now having irregular cycles. She is also followed by GYN. She should follow clean diet , free of processed foods. OTC supplements Remifemin or Estroven may be helpful for her.

## 2022-10-30 NOTE — Assessment & Plan Note (Signed)
She agrees to try magnesium glycinate nightly. Her sx are likely related to perimenopause. She will let me know if her sx persist.

## 2022-11-24 ENCOUNTER — Inpatient Hospital Stay: Payer: BC Managed Care – PPO | Attending: Physician Assistant

## 2022-11-24 DIAGNOSIS — Z86718 Personal history of other venous thrombosis and embolism: Secondary | ICD-10-CM | POA: Diagnosis not present

## 2022-11-24 DIAGNOSIS — D509 Iron deficiency anemia, unspecified: Secondary | ICD-10-CM | POA: Insufficient documentation

## 2022-11-24 DIAGNOSIS — D500 Iron deficiency anemia secondary to blood loss (chronic): Secondary | ICD-10-CM

## 2022-11-24 LAB — CMP (CANCER CENTER ONLY)
ALT: 14 U/L (ref 0–44)
AST: 20 U/L (ref 15–41)
Albumin: 4 g/dL (ref 3.5–5.0)
Alkaline Phosphatase: 47 U/L (ref 38–126)
Anion gap: 6 (ref 5–15)
BUN: 18 mg/dL (ref 6–20)
CO2: 27 mmol/L (ref 22–32)
Calcium: 9.4 mg/dL (ref 8.9–10.3)
Chloride: 105 mmol/L (ref 98–111)
Creatinine: 0.98 mg/dL (ref 0.44–1.00)
GFR, Estimated: >60 mL/min (ref >60)
Glucose, Bld: 99 mg/dL (ref 70–99)
Potassium: 4.2 mmol/L (ref 3.5–5.1)
Sodium: 138 mmol/L (ref 135–145)
Total Bilirubin: 0.4 mg/dL (ref 0.3–1.2)
Total Protein: 7.6 g/dL (ref 6.5–8.1)

## 2022-11-24 LAB — CBC WITH DIFFERENTIAL (CANCER CENTER ONLY)
Abs Immature Granulocytes: 0.02 10*3/uL (ref 0.00–0.07)
Basophils Absolute: 0 10*3/uL (ref 0.0–0.1)
Basophils Relative: 0 %
Eosinophils Absolute: 0.1 10*3/uL (ref 0.0–0.5)
Eosinophils Relative: 1 %
HCT: 36.9 % (ref 36.0–46.0)
Hemoglobin: 12.6 g/dL (ref 12.0–15.0)
Immature Granulocytes: 0 %
Lymphocytes Relative: 30 %
Lymphs Abs: 2.1 10*3/uL (ref 0.7–4.0)
MCH: 35.4 pg — ABNORMAL HIGH (ref 26.0–34.0)
MCHC: 34.1 g/dL (ref 30.0–36.0)
MCV: 103.7 fL — ABNORMAL HIGH (ref 80.0–100.0)
Monocytes Absolute: 0.4 10*3/uL (ref 0.1–1.0)
Monocytes Relative: 6 %
Neutro Abs: 4.3 10*3/uL (ref 1.7–7.7)
Neutrophils Relative %: 63 %
Platelet Count: 276 10*3/uL (ref 150–400)
RBC: 3.56 MIL/uL — ABNORMAL LOW (ref 3.87–5.11)
RDW: 11.5 % (ref 11.5–15.5)
WBC Count: 6.9 10*3/uL (ref 4.0–10.5)
nRBC: 0 % (ref 0.0–0.2)

## 2022-11-24 LAB — FERRITIN: Ferritin: 47 ng/mL (ref 11–307)

## 2022-11-24 LAB — IRON AND IRON BINDING CAPACITY (CC-WL,HP ONLY)
Iron: 109 ug/dL (ref 28–170)
Saturation Ratios: 28 % (ref 10.4–31.8)
TIBC: 385 ug/dL (ref 250–450)
UIBC: 276 ug/dL (ref 148–442)

## 2022-11-26 ENCOUNTER — Telehealth: Payer: Self-pay

## 2022-11-26 NOTE — Telephone Encounter (Signed)
-----   Message from Briant Cedar sent at 11/26/2022 12:12 PM EDT ----- Please notify patient that labs were reviewed. No evidence of iron deficiency. No need for IV iron at this time. Continue on iron pills. ----- Message ----- From: Leory Plowman, [facility] [place] Sent: 11/24/2022   8:15 AM EDT To: Briant Cedar, PA-C

## 2022-11-26 NOTE — Telephone Encounter (Signed)
Pt advised of lab results and recommendations.

## 2023-01-17 ENCOUNTER — Other Ambulatory Visit: Payer: Self-pay | Admitting: Internal Medicine

## 2023-01-17 DIAGNOSIS — Z1231 Encounter for screening mammogram for malignant neoplasm of breast: Secondary | ICD-10-CM

## 2023-02-08 ENCOUNTER — Ambulatory Visit
Admission: RE | Admit: 2023-02-08 | Discharge: 2023-02-08 | Disposition: A | Discharge status: Home / Self Care | Payer: BC Managed Care – PPO | Source: Ambulatory Visit | Attending: Internal Medicine | Admitting: Internal Medicine

## 2023-02-08 DIAGNOSIS — Z1231 Encounter for screening mammogram for malignant neoplasm of breast: Secondary | ICD-10-CM

## 2023-02-23 NOTE — Progress Notes (Unsigned)
Patient Care Team: Dorothyann Peng, MD as PCP - General (Internal Medicine) Jodelle Red, MD as PCP - Cardiology (Cardiology) Philip Aspen, DO as Consulting Physician (Obstetrics and Gynecology)  Clinic Day:  02/24/2023  Referring physician: Dorothyann Peng, MD  ASSESSMENT & PLAN:   Assessment & Plan: History of pulmonary embolism -CTA chest from 08/15/2020 showed extensive bilateral pulmonary embolism with associated right heart strain. Doppler US on 08/16/2020 showed DVT involving the left popliteal vein and left peroneal veins.  --Risk factors include smoking and sedentary lifestyle. Patient continues to smoke occasionally and sits for long periods of time when she works.  --Hypercoagulable workup was negative for any clotting disorders.  --After six months of anticoagulation, we discussed risks versus benefits of continuing on Xarelto. Since patient reports that she has a sedentary lifestyle and smokes intermittently, we decided to continue on anticoagulation but transition to maintenance dose.  --continue with maintenance dose of Xarelto 10 mg once daily    Iron deficiency anemia --Last received IV feraheme 510 mg x 2 doses on 06/03/2022 and 06/10/2022 --Labs today show that anemia has resolved with Hgb of 13.1. Iron panel shows iron 117, TIBC 353, saturation 33%, ferritin pending.  --No need for additional IV iron at this time.  --Continue ferrous sulfate 325 mg once a day with a source of vitamin C   Plan: Labs reviewed  -CBC showing WBC 6.1; Hgb 13.4; Hct 40.3; MCV 104.7; plt 365; Anc 3.9 -CMP - K 4.2; glucose 97; BUN 16; Creatinine 0.99; eGFR > 60; Ca 9.7; LFTs normal.   -Ferritin 20; iron 83; TIBC 423; sat ratio 30% Patient is clinically doing well. Continue Xarelto 10 mg daily Repeat labs in 3 months Labs and follow-up in 6 months.  The patient understands the plans discussed today and is in agreement with them.  She knows to contact our office if she develops  concerns prior to her next appointment.  I provided 25 minutes of face-to-face time during this encounter and > 50% was spent counseling as documented under my assessment and plan.    Carlean Jews, NP  [facility] - A DEPT [facility] [address] [place] [address] Dept: [phone] Dept Fax: [phone]   No orders of the defined types were placed in this encounter.     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  She was last seen by [place], [place] on 08/24/2022.  She states she has been doing well.  She has noticed no unusual bleeding, no bruising, no blood in stool, no nosebleeds.  She denies chest pain, chest pressure, or shortness of breath. She denies headaches or visual disturbances. She denies abdominal pain, nausea, vomiting, or changes in bowel or bladder habits.  She denies fevers or chills. She denies pain. Her appetite is good. Her weight has increased 3 pounds over last 4 months .  I have reviewed the past medical history, past surgical history, social history and family history with the patient and they are unchanged from previous note.  ALLERGIES:  has no known allergies.  MEDICATIONS:  Current Outpatient Medications  Medication Sig Dispense Refill   ferrous sulfate 325 (65 FE) MG EC tablet Take 1 tablet (325 mg total) by mouth daily with breakfast. 30  tablet 6   triamcinolone cream (KENALOG) 0.1 % APPLY TO AFFECTED AREA TWICE A DAY AS NEEDED 30 g 0   valsartan-hydrochlorothiazide (DIOVAN-HCT) 80-12.5 MG tablet Take 1 tablet by mouth daily. 90 tablet 2   XARELTO 10 MG TABS tablet TAKE 1 TABLET BY MOUTH EVERY DAY 90 tablet 1   No current facility-administered medications for this visit.     REVIEW OF SYSTEMS:    Constitutional: Denies fevers, chills or abnormal weight loss Eyes: Denies blurriness of vision Ears, nose, mouth, throat, and face: Denies mucositis or sore throat Respiratory: Denies cough, dyspnea or wheezes Cardiovascular: Denies palpitation, chest discomfort or lower extremity swelling Gastrointestinal:  Denies nausea, heartburn or change in bowel habits Skin: Denies abnormal skin rashes Lymphatics: Denies new lymphadenopathy or easy bruising Neurological:Denies numbness, tingling or new weaknesses Behavioral/Psych: Mood is stable, no new changes  All other systems were reviewed with the patient and are negative.   VITALS:   Today's Vitals   02/24/23 0838 02/24/23 0840  BP: (!) 153/83 130/80  Pulse: 72   Resp: 17   Temp: 98.4 F (36.9 C)   TempSrc: Temporal   SpO2: 100%   Weight: 224 lb 6.4 oz (101.8 kg)   PainSc:  0-No pain   Body mass index is 39.75 kg/m.   Wt Readings from Last 3 Encounters:  02/24/23 224 lb 6.4 oz (101.8 kg)  10/27/22 221 lb (100.2 kg)  08/24/22 220 lb 12.8 oz (100.2 kg)    Body mass index is 39.75 kg/m.  Performance status (ECOG): 1 - Symptomatic but completely ambulatory  PHYSICAL EXAM:   GENERAL:alert, no distress and comfortable SKIN: skin color, texture, turgor are normal, no rashes or significant lesions EYES: normal, Conjunctiva are pink and non-injected, sclera clear OROPHARYNX:no exudate, no erythema and lips, buccal mucosa, and tongue normal  NECK: supple, thyroid normal size, non-tender, without nodularity LYMPH:  no palpable lymphadenopathy in the cervical, axillary or inguinal LUNGS: clear to auscultation and percussion with normal breathing effort HEART: regular rate & rhythm and no murmurs and no lower extremity edema ABDOMEN:abdomen soft, non-tender and normal bowel sounds Musculoskeletal:no cyanosis of digits and no clubbing  NEURO: alert & oriented x 3 with fluent speech, no focal motor/sensory deficits  LABORATORY  DATA:  I have reviewed the data as listed    Component Value Date/Time   NA 139 02/24/2023 0812   NA 141 08/18/2021 1040   K 4.2 02/24/2023 0812   CL 106 02/24/2023 0812   CO2 27 02/24/2023 0812   GLUCOSE 97 02/24/2023 0812   BUN 16 02/24/2023 0812   BUN 16 08/18/2021 1040   CREATININE 0.99 02/24/2023 0812   CALCIUM 9.7 02/24/2023 0812   PROT 7.8 02/24/2023 0812   PROT 7.0 08/07/2020 0921   ALBUMIN 4.1 02/24/2023 0812   ALBUMIN 4.2 08/07/2020 0921   AST 21 02/24/2023 0812   ALT 14 02/24/2023 0812   ALKPHOS 51 02/24/2023 0812   BILITOT 0.4 02/24/2023 0812   GFRNONAA >60 02/24/2023 0812   GFRAA 83 02/06/2020 1115     Lab Results  Component Value Date   WBC 6.1 02/24/2023   NEUTROABS 3.9 02/24/2023   HGB 13.4 02/24/2023   HCT 40.3 02/24/2023   MCV 104.7 (H) 02/24/2023   PLT 265 02/24/2023       RADIOGRAPHIC STUDIES: MM 3D SCREENING MAMMOGRAM BILATERAL BREAST Result Date: 02/09/2023 CLINICAL DATA:  Screening. EXAM: DIGITAL SCREENING BILATERAL MAMMOGRAM WITH TOMOSYNTHESIS AND CAD TECHNIQUE: Bilateral screening digital craniocaudal and mediolateral  oblique mammograms were obtained. Bilateral screening digital breast tomosynthesis was performed. The images were evaluated with computer-aided detection. COMPARISON:  Previous exam(s). ACR Breast Density Category c: The breasts are heterogeneously dense, which may obscure small masses. FINDINGS: There are no findings suspicious for malignancy. IMPRESSION: No mammographic evidence of malignancy. A result letter of this screening mammogram will be mailed directly to the patient. RECOMMENDATION: Screening mammogram in one year. (Code:SM-B-01Y) BI-RADS CATEGORY  1: Negative. Electronically Signed   By: Baird Lyons M.D.   On: 02/09/2023 13:34

## 2023-02-23 NOTE — Assessment & Plan Note (Signed)
--  Last received IV feraheme 510 mg x 2 doses on 06/03/2022 and 06/10/2022 --Labs today show that anemia has resolved with Hgb of 13.1. Iron panel shows iron 117, TIBC 353, saturation 33%, ferritin pending.  --No need for additional IV iron at this time.  --Continue ferrous sulfate 325 mg once a day with a source of vitamin C

## 2023-02-23 NOTE — Assessment & Plan Note (Signed)
-  CTA chest from 08/15/2020 showed extensive bilateral pulmonary embolism with associated right heart strain. Doppler US on 08/16/2020 showed DVT involving the left popliteal vein and left peroneal veins.  --Risk factors include smoking and sedentary lifestyle. Patient continues to smoke occasionally and sits for long periods of time when she works.  --Hypercoagulable workup was negative for any clotting disorders.  --After six months of anticoagulation, we discussed risks versus benefits of continuing on Xarelto. Since patient reports that she has a sedentary lifestyle and smokes intermittently, we decided to continue on anticoagulation but transition to maintenance dose.  --continue with maintenance dose of Xarelto 10 mg once daily

## 2023-02-24 ENCOUNTER — Encounter: Payer: Self-pay | Admitting: Nurse Practitioner

## 2023-02-24 ENCOUNTER — Inpatient Hospital Stay: Payer: BC Managed Care – PPO | Attending: Physician Assistant

## 2023-02-24 ENCOUNTER — Other Ambulatory Visit: Payer: Self-pay

## 2023-02-24 ENCOUNTER — Inpatient Hospital Stay (HOSPITAL_BASED_OUTPATIENT_CLINIC_OR_DEPARTMENT_OTHER): Payer: BC Managed Care – PPO | Admitting: Nurse Practitioner

## 2023-02-24 VITALS — BP 130/80 | HR 72 | Temp 98.4°F | Resp 17 | Wt 224.4 lb

## 2023-02-24 DIAGNOSIS — Z86711 Personal history of pulmonary embolism: Secondary | ICD-10-CM

## 2023-02-24 DIAGNOSIS — Z86718 Personal history of other venous thrombosis and embolism: Secondary | ICD-10-CM

## 2023-02-24 DIAGNOSIS — D508 Other iron deficiency anemias: Secondary | ICD-10-CM

## 2023-02-24 DIAGNOSIS — D509 Iron deficiency anemia, unspecified: Secondary | ICD-10-CM | POA: Diagnosis present

## 2023-02-24 DIAGNOSIS — Z7901 Long term (current) use of anticoagulants: Secondary | ICD-10-CM | POA: Diagnosis not present

## 2023-02-24 DIAGNOSIS — D500 Iron deficiency anemia secondary to blood loss (chronic): Secondary | ICD-10-CM

## 2023-02-24 LAB — CBC WITH DIFFERENTIAL (CANCER CENTER ONLY)
Abs Immature Granulocytes: 0.01 10*3/uL (ref 0.00–0.07)
Basophils Absolute: 0 10*3/uL (ref 0.0–0.1)
Basophils Relative: 0 %
Eosinophils Absolute: 0.1 10*3/uL (ref 0.0–0.5)
Eosinophils Relative: 1 %
HCT: 40.3 % (ref 36.0–46.0)
Hemoglobin: 13.4 g/dL (ref 12.0–15.0)
Immature Granulocytes: 0 %
Lymphocytes Relative: 28 %
Lymphs Abs: 1.7 10*3/uL (ref 0.7–4.0)
MCH: 34.8 pg — ABNORMAL HIGH (ref 26.0–34.0)
MCHC: 33.3 g/dL (ref 30.0–36.0)
MCV: 104.7 fL — ABNORMAL HIGH (ref 80.0–100.0)
Monocytes Absolute: 0.4 10*3/uL (ref 0.1–1.0)
Monocytes Relative: 7 %
Neutro Abs: 3.9 10*3/uL (ref 1.7–7.7)
Neutrophils Relative %: 64 %
Platelet Count: 265 10*3/uL (ref 150–400)
RBC: 3.85 MIL/uL — ABNORMAL LOW (ref 3.87–5.11)
RDW: 12.2 % (ref 11.5–15.5)
WBC Count: 6.1 10*3/uL (ref 4.0–10.5)
nRBC: 0 % (ref 0.0–0.2)

## 2023-02-24 LAB — IRON AND IRON BINDING CAPACITY (CC-WL,HP ONLY)
Iron: 83 ug/dL (ref 28–170)
Saturation Ratios: 20 % (ref 10.4–31.8)
TIBC: 423 ug/dL (ref 250–450)
UIBC: 340 ug/dL (ref 148–442)

## 2023-02-24 LAB — CMP (CANCER CENTER ONLY)
ALT: 14 U/L (ref 0–44)
AST: 21 U/L (ref 15–41)
Albumin: 4.1 g/dL (ref 3.5–5.0)
Alkaline Phosphatase: 51 U/L (ref 38–126)
Anion gap: 6 (ref 5–15)
BUN: 16 mg/dL (ref 6–20)
CO2: 27 mmol/L (ref 22–32)
Calcium: 9.7 mg/dL (ref 8.9–10.3)
Chloride: 106 mmol/L (ref 98–111)
Creatinine: 0.99 mg/dL (ref 0.44–1.00)
GFR, Estimated: >60 mL/min (ref >60)
Glucose, Bld: 97 mg/dL (ref 70–99)
Potassium: 4.2 mmol/L (ref 3.5–5.1)
Sodium: 139 mmol/L (ref 135–145)
Total Bilirubin: 0.4 mg/dL (ref <1.2)
Total Protein: 7.8 g/dL (ref 6.5–8.1)

## 2023-02-24 LAB — FERRITIN: Ferritin: 20 ng/mL (ref 11–307)

## 2023-02-28 ENCOUNTER — Ambulatory Visit
Admission: EM | Admit: 2023-02-28 | Discharge: 2023-02-28 | Disposition: A | Discharge status: Home / Self Care | Payer: BC Managed Care – PPO | Attending: Physician Assistant | Admitting: Physician Assistant

## 2023-02-28 DIAGNOSIS — R5383 Other fatigue: Secondary | ICD-10-CM | POA: Insufficient documentation

## 2023-02-28 DIAGNOSIS — M5441 Lumbago with sciatica, right side: Secondary | ICD-10-CM

## 2023-02-28 DIAGNOSIS — M545 Low back pain, unspecified: Secondary | ICD-10-CM

## 2023-02-28 DIAGNOSIS — N921 Excessive and frequent menstruation with irregular cycle: Secondary | ICD-10-CM | POA: Insufficient documentation

## 2023-02-28 MED ORDER — PREDNISONE 20 MG PO TABS: 40.0000 mg | ORAL_TABLET | Freq: Every day | ORAL | 0 refills | Status: AC

## 2023-02-28 MED ORDER — CYCLOBENZAPRINE HCL 10 MG PO TABS
10.0000 mg | ORAL_TABLET | Freq: Two times a day (BID) | ORAL | 0 refills | Status: DC | PRN
Start: 2023-02-28 — End: 2023-06-06

## 2023-02-28 NOTE — ED Triage Notes (Signed)
"  I am having lower back pain (lower right) with radiation down buttocks and leg and starting to be the same on the left side". "I may have pulled something". No numbness. No tingling. No swelling.

## 2023-02-28 NOTE — ED Provider Notes (Signed)
[facility]    CSN: [id number] Arrival date & time: 02/28/23  0805      History   Chief Complaint Chief Complaint  Patient presents with   Back Pain    HPI Jessica Rodgers is a 47 y.o. female.   Patient here today for evaluation of lower back pain that started recently.  She reports that pain radiates down her right buttocks and leg.  She started have some similar symptoms to the left.  She notes she might of pulled something.  She has not any numbness or tingling.  She has tried over-the-counter medication without resolution.  The history is provided by the patient.  Back Pain Associated symptoms: no abdominal pain, no fever and no numbness     Past Medical History:  Diagnosis Date   Gallstones    GERD (gastroesophageal reflux disease)    Hypertension    Pulmonary emboli (HCC)     Patient Active Problem List   Diagnosis Date Noted   Fatigue 02/28/2023   Irregular intermenstrual bleeding 02/28/2023   Acquired thrombophilia (HCC) 10/30/2022   Encounter for general adult medical examination w/o abnormal findings 10/27/2022   Anxiety 10/27/2022   Perimenopausal 10/27/2022   History of pulmonary embolism 02/23/2022   History of DVT (deep vein thrombosis) 02/23/2022   Iron deficiency anemia 12/04/2021   Normocytic anemia 02/27/2021   Pulmonary embolism (HCC) 10/07/2020   DVT (deep venous thrombosis) (HCC) 08/17/2020   AKI (acute kidney injury) (HCC) 08/15/2020   Essential hypertension 08/15/2020   Obesity (BMI 35.0-39.9 without comorbidity) 08/15/2020   Obesity with body mass index 30 or greater 10/31/2017   Irregular periods 10/31/2017   Smoker 10/31/2017   Urinary tract infection, site not specified 09/19/2017   Enterocolitis 11/06/2016   Sepsis (HCC) 11/06/2016   Class 2 severe obesity due to excess calories with serious comorbidity and body mass index (BMI) of 39.0 to 39.9 in adult [facility]) 11/06/2016   Pyelonephritis 11/05/2016    Past  Surgical History:  Procedure Laterality Date   BREAST EXCISIONAL BIOPSY Right 04/07/2017   PASH   RADIOACTIVE SEED GUIDED EXCISIONAL BREAST BIOPSY Right 04/07/2017   Procedure: RIGHT RADIOACTIVE SEED GUIDED EXCISIONAL BREAST BIOPSY;  Surgeon: Emelia Loron, MD;  Location:  SURGERY CENTER;  Service: General;  Laterality: Right;   TUBAL LIGATION  1997    OB History   No obstetric history on file.      Home Medications    Prior to Admission medications   Medication Sig Start Date End Date Taking? Authorizing Provider  cyclobenzaprine (FLEXERIL) 10 MG tablet Take 1 tablet (10 mg total) by mouth 2 (two) times daily as needed for muscle spasms. 02/28/23  Yes Tomi Bamberger, PA-C  valsartan-hydrochlorothiazide (DIOVAN-HCT) 80-12.5 MG tablet Take 1 tablet by mouth daily. 04/22/22  Yes Dorothyann Peng, MD  XARELTO 10 MG TABS tablet TAKE 1 TABLET BY MOUTH EVERY DAY 09/27/22  Yes Dorothyann Peng, MD  ferrous sulfate 325 (65 FE) MG EC tablet Take 1 tablet (325 mg total) by mouth daily with breakfast. 08/24/22   Georga Kaufmann T, PA-C  triamcinolone cream (KENALOG) 0.1 % APPLY TO AFFECTED AREA TWICE A DAY AS NEEDED 07/02/22   Dorothyann Peng, MD    Family History Family History  Problem Relation Age of Onset   Hypertension Mother    Arthritis Mother    Glaucoma Mother    Other Father        unsure of his health   Breast cancer  Maternal Grandmother 76   Breast cancer Paternal Grandmother     Social History Social History   Tobacco Use   Smoking status: Former    Current packs/day: 0.00    Average packs/day: 0.5 packs/day for 18.0 years (9.0 ttl pk-yrs)    Types: Cigarettes    Start date: 02/13/2003    Quit date: 08/09/2020    Years since quitting: 2.5   Smokeless tobacco: Never  Vaping Use   Vaping status: Never Used  Substance Use Topics   Alcohol use: Yes    Alcohol/week: 4.0 standard drinks of alcohol    Types: 3 Glasses of wine, 1 Standard drinks or equivalent per  week    Comment: Occassionally   Drug use: No     Allergies   Patient has no known allergies.   Review of Systems Review of Systems  Constitutional:  Negative for chills and fever.  Eyes:  Negative for discharge and redness.  Respiratory:  Negative for shortness of breath.   Gastrointestinal:  Negative for abdominal pain, nausea and vomiting.  Musculoskeletal:  Positive for back pain and myalgias.  Neurological:  Negative for numbness.     Physical Exam Triage Vital Signs ED Triage Vitals  Encounter Vitals Group     BP 02/28/23 0845 123/77     Systolic BP Percentile --      Diastolic BP Percentile --      Pulse Rate 02/28/23 0845 77     Resp 02/28/23 0845 18     Temp 02/28/23 0845 98.7 F (37.1 C)     Temp Source 02/28/23 0845 Oral     SpO2 02/28/23 0845 97 %     Weight 02/28/23 0843 224 lb (101.6 kg)     Height 02/28/23 0843 5\' 3"  (1.6 m)     Head Circumference --      Peak Flow --      Pain Score 02/28/23 0838 0     Pain Loc --      Pain Education --      Exclude from Growth Chart --    No data found.  Updated Vital Signs BP 123/77 (BP Location: Left Arm)   Pulse 77   Temp 98.7 F (37.1 C) (Oral)   Resp 18   Ht 5\' 3"  (1.6 m)   Wt 224 lb (101.6 kg)   LMP 01/31/2023   SpO2 97%   BMI 39.68 kg/m      Physical Exam Vitals and nursing note reviewed.  Constitutional:      General: She is not in acute distress.    Appearance: Normal appearance. She is not ill-appearing.  HENT:     Head: Normocephalic and atraumatic.  Eyes:     Conjunctiva/sclera: Conjunctivae normal.  Cardiovascular:     Rate and Rhythm: Normal rate.  Pulmonary:     Effort: Pulmonary effort is normal.  Musculoskeletal:     Comments: No tenderness palpation noted to midline spine.  Mild tenderness palpation across low back  Neurological:     Mental Status: She is alert.  Psychiatric:        Mood and Affect: Mood normal.        Behavior: Behavior normal.        Thought Content:  Thought content normal.      UC Treatments / Results  Labs (all labs ordered are listed, but only abnormal results are displayed) Labs Reviewed - No data to display  EKG   Radiology No results found.  Procedures Procedures (including critical care time)  Medications Ordered in UC Medications - No data to display  Initial Impression / Assessment and Plan / UC Course  I have reviewed the triage vital signs and the nursing notes.  Pertinent labs & imaging results that were available during my care of the patient were reviewed by me and considered in my medical decision making (see chart for details).    Will treat with steroid burst and muscle relaxer.  Discussed massage, stretching, topical heat.  Advised follow-up if no gradual improvement or with any further concerns.  Patient expressed understanding.  Final Clinical Impressions(s) / UC Diagnoses   Final diagnoses:  Acute left-sided low back pain without sciatica  Acute right-sided low back pain with right-sided sciatica   Discharge Instructions   None    ED Prescriptions     Medication Sig Dispense Auth. Provider   predniSONE (DELTASONE) 20 MG tablet Take 2 tablets (40 mg total) by mouth daily with breakfast for 5 days. 10 tablet Erma Pinto F, PA-C   cyclobenzaprine (FLEXERIL) 10 MG tablet Take 1 tablet (10 mg total) by mouth 2 (two) times daily as needed for muscle spasms. 20 tablet Tomi Bamberger, PA-C      PDMP not reviewed this encounter.   Tomi Bamberger, PA-C 03/06/23 [phone]

## 2023-04-05 ENCOUNTER — Other Ambulatory Visit: Payer: Self-pay | Admitting: Internal Medicine

## 2023-05-02 ENCOUNTER — Ambulatory Visit (INDEPENDENT_AMBULATORY_CARE_PROVIDER_SITE_OTHER): Payer: BC Managed Care – PPO | Admitting: Internal Medicine

## 2023-05-02 ENCOUNTER — Encounter: Payer: Self-pay | Admitting: Internal Medicine

## 2023-05-02 VITALS — BP 108/70 | Temp 98.6°F | Ht 63.0 in | Wt 222.0 lb

## 2023-05-02 DIAGNOSIS — R718 Other abnormality of red blood cells: Secondary | ICD-10-CM | POA: Insufficient documentation

## 2023-05-02 DIAGNOSIS — F419 Anxiety disorder, unspecified: Secondary | ICD-10-CM

## 2023-05-02 DIAGNOSIS — Z6839 Body mass index (BMI) 39.0-39.9, adult: Secondary | ICD-10-CM

## 2023-05-02 DIAGNOSIS — E66812 Obesity, class 2: Secondary | ICD-10-CM

## 2023-05-02 DIAGNOSIS — D6869 Other thrombophilia: Secondary | ICD-10-CM

## 2023-05-02 DIAGNOSIS — I10 Essential (primary) hypertension: Secondary | ICD-10-CM

## 2023-05-02 MED ORDER — VALSARTAN-HYDROCHLOROTHIAZIDE 80-12.5 MG PO TABS: 1.0000 | ORAL_TABLET | Freq: Every day | ORAL | 2 refills | Status: DC

## 2023-05-02 NOTE — Assessment & Plan Note (Signed)
 She is encouraged to strive for BMI less than 30 to decrease cardiac risk. Advised to aim for at least 150 minutes of exercise per week.

## 2023-05-02 NOTE — Progress Notes (Signed)
I,[name], CMA,acting as a Neurosurgeon for Jessica Aliment, MD.,have documented all relevant documentation on the behalf of Jessica Aliment, MD,as directed by  Jessica Aliment, MD while in the presence of Jessica Aliment, MD.  Subjective:  Patient ID: Jessica Rodgers , female    DOB: [birth year] , 48 y.o.   MRN: [id number]  Chief Complaint  Patient presents with   Hypertension    HPI  The patient is here today for bp check. She reports compliance with meds. She is currently taking valsartan/hct for HTN. She denies headaches, chest pain and shortness of breath.   She has no specific questions or concerns.   Hypertension This is a chronic problem. The current episode started more than 1 year ago. The problem has been gradually improving since onset. The problem is controlled. Pertinent negatives include no palpitations or shortness of breath. Past treatments include ACE inhibitors, angiotensin blockers and diuretics. The current treatment provides moderate improvement.     Past Medical History:  Diagnosis Date   Gallstones    GERD (gastroesophageal reflux disease)    Hypertension    Pulmonary emboli (HCC)      Family History  Problem Relation Age of Onset   Hypertension Mother    Arthritis Mother    Glaucoma Mother    Other Father        unsure of his health   Breast cancer Maternal Grandmother 33   Breast cancer Paternal Grandmother      Current Outpatient Medications:    ferrous sulfate 325 (65 FE) MG EC tablet, Take 1 tablet (325 mg total) by mouth daily with breakfast., Disp: 30 tablet, Rfl: 6   triamcinolone cream (KENALOG) 0.1 %, APPLY TO AFFECTED AREA TWICE A DAY AS NEEDED, Disp: 30 g, Rfl: 0   XARELTO 10 MG TABS tablet, TAKE 1 TABLET BY MOUTH EVERY DAY, Disp: 90 tablet, Rfl: 1   cyclobenzaprine (FLEXERIL) 10 MG tablet, Take 1 tablet (10 mg total) by mouth 2 (two) times daily as needed for muscle spasms. (Patient not taking: Reported on 05/02/2023), Disp: 20  tablet, Rfl: 0   valsartan-hydrochlorothiazide (DIOVAN-HCT) 80-12.5 MG tablet, Take 1 tablet by mouth daily., Disp: 90 tablet, Rfl: 2   No Known Allergies   Review of Systems  Constitutional: Negative.   Respiratory: Negative.  Negative for shortness of breath.   Cardiovascular: Negative.  Negative for palpitations.  Neurological: Negative.   Psychiatric/Behavioral: Negative.       Today's Vitals   05/02/23 0829  BP: 108/70  Temp: 98.6 F (37 C)  SpO2: 98%  Weight: 222 lb (100.7 kg)  Height: 5\' 3"  (1.6 m)   Body mass index is 39.33 kg/m.  Wt Readings from Last 3 Encounters:  05/02/23 222 lb (100.7 kg)  02/28/23 224 lb (101.6 kg)  02/24/23 224 lb 6.4 oz (101.8 kg)     Objective:  Physical Exam Vitals and nursing note reviewed.  Constitutional:      Appearance: Normal appearance. She is obese.  HENT:     Head: Normocephalic and atraumatic.  Eyes:     Extraocular Movements: Extraocular movements intact.  Cardiovascular:     Rate and Rhythm: Normal rate and regular rhythm.     Heart sounds: Normal heart sounds.  Pulmonary:     Effort: Pulmonary effort is normal.     Breath sounds: Normal breath sounds.  Musculoskeletal:     Cervical back: Normal range of motion.  Skin:    General:  Skin is warm.  Neurological:     General: No focal deficit present.     Mental Status: She is alert.  Psychiatric:        Mood and Affect: Mood normal.        Behavior: Behavior normal.         Assessment And Plan:  Essential hypertension Assessment & Plan: Chronic, well controlled.  She will c/w valsartan/hct 80/12.5mg  daily. Renal function from December 2024 reviewed.  She is encouraged to follow low sodium diet. She will f/u in six months for CPE.   Orders: -     Valsartan-hydroCHLOROthiazide; Take 1 tablet by mouth daily.  Dispense: 90 tablet; Refill: 2  Elevated MCV Assessment & Plan: I will check vitamin B12 level today. December 2024 labs reviewed in detail. No need to  recheck renal function today.   Orders: -     Vitamin B12  Acquired thrombophilia (HCC) Assessment & Plan: She is currently on Xarelto, due to h/o PE. She is also followed by Hematology.    Class 2 severe obesity due to excess calories with serious comorbidity and body mass index (BMI) of 39.0 to 39.9 in adult [facility]) Assessment & Plan: She is encouraged to strive for BMI less than 30 to decrease cardiac risk. Advised to aim for at least 150 minutes of exercise per week.    She is encouraged to strive for BMI less than 30 to decrease cardiac risk. Advised to aim for at least 150 minutes of exercise per week.    Return if symptoms worsen or fail to improve.  Patient was given opportunity to ask questions. Patient verbalized understanding of the plan and was able to repeat key elements of the plan. All questions were answered to their satisfaction.    I, Jessica Aliment, MD, have reviewed all documentation for this visit. The documentation on 05/02/23 for the exam, diagnosis, procedures, and orders are all accurate and complete.   IF YOU HAVE BEEN REFERRED TO A SPECIALIST, IT MAY TAKE 1-2 WEEKS TO SCHEDULE/PROCESS THE REFERRAL. IF YOU HAVE NOT HEARD FROM US/SPECIALIST IN TWO WEEKS, PLEASE GIVE [place] A CALL AT [phone].   THE PATIENT IS ENCOURAGED TO PRACTICE SOCIAL DISTANCING DUE TO THE COVID-19 PANDEMIC.

## 2023-05-02 NOTE — Assessment & Plan Note (Signed)
Chronic, well controlled.  She will c/w valsartan/hct 80/12.5mg  daily. Renal function from December 2024 reviewed.  She is encouraged to follow low sodium diet. She will f/u in six months for CPE.

## 2023-05-02 NOTE — Patient Instructions (Signed)
 Hypertension, Adult Hypertension is another name for high blood pressure. High blood pressure forces your heart to work harder to pump blood. This can cause problems over time. There are two numbers in a blood pressure reading. There is a top number (systolic) over a bottom number (diastolic). It is best to have a blood pressure that is below 120/80. What are the causes? The cause of this condition is not known. Some other conditions can lead to high blood pressure. What increases the risk? Some lifestyle factors can make you more likely to develop high blood pressure: Smoking. Not getting enough exercise or physical activity. Being overweight. Having too much fat, sugar, calories, or salt (sodium) in your diet. Drinking too much alcohol. Other risk factors include: Having any of these conditions: Heart disease. Diabetes. High cholesterol. Kidney disease. Obstructive sleep apnea. Having a family history of high blood pressure and high cholesterol. Age. The risk increases with age. Stress. What are the signs or symptoms? High blood pressure may not cause symptoms. Very high blood pressure (hypertensive crisis) may cause: Headache. Fast or uneven heartbeats (palpitations). Shortness of breath. Nosebleed. Vomiting or feeling like you may vomit (nauseous). Changes in how you see. Very bad chest pain. Feeling dizzy. Seizures. How is this treated? This condition is treated by making healthy lifestyle changes, such as: Eating healthy foods. Exercising more. Drinking less alcohol. Your doctor may prescribe medicine if lifestyle changes do not help enough and if: Your top number is above 130. Your bottom number is above 80. Your personal target blood pressure may vary. Follow these instructions at home: Eating and drinking  If told, follow the DASH eating plan. To follow this plan: Fill one half of your plate at each meal with fruits and vegetables. Fill one fourth of your plate  at each meal with whole grains. Whole grains include whole-wheat pasta, brown rice, and whole-grain bread. Eat or drink low-fat dairy products, such as skim milk or low-fat yogurt. Fill one fourth of your plate at each meal with low-fat (lean) proteins. Low-fat proteins include fish, chicken without skin, eggs, beans, and tofu. Avoid fatty meat, cured and processed meat, or chicken with skin. Avoid pre-made or processed food. Limit the amount of salt in your diet to less than 1,500 mg each day. Do not drink alcohol if: Your doctor tells you not to drink. You are pregnant, may be pregnant, or are planning to become pregnant. If you drink alcohol: Limit how much you have to: 0-1 drink a day for women. 0-2 drinks a day for men. Know how much alcohol is in your drink. In the [place] one drink equals one 12 oz bottle of beer (355 mL), one 5 oz glass of wine (148 mL), or one 1 oz glass of hard liquor (44 mL). Lifestyle  Work with your doctor to stay at a healthy weight or to lose weight. Ask your doctor what the best weight is for you. Get at least 30 minutes of exercise that causes your heart to beat faster (aerobic exercise) most days of the week. This may include walking, swimming, or biking. Get at least 30 minutes of exercise that strengthens your muscles (resistance exercise) at least 3 days a week. This may include lifting weights or doing Pilates. Do not smoke or use any products that contain nicotine or tobacco. If you need help quitting, ask your doctor. Check your blood pressure at home as told by your doctor. Keep all follow-up visits. Medicines Take over-the-counter and prescription medicines  only as told by your doctor. Follow directions carefully. Do not skip doses of blood pressure medicine. The medicine does not work as well if you skip doses. Skipping doses also puts you at risk for problems. Ask your doctor about side effects or reactions to medicines that you should watch  for. Contact a doctor if: You think you are having a reaction to the medicine you are taking. You have headaches that keep coming back. You feel dizzy. You have swelling in your ankles. You have trouble with your vision. Get help right away if: You get a very bad headache. You start to feel mixed up (confused). You feel weak or numb. You feel faint. You have very bad pain in your: Chest. Belly (abdomen). You vomit more than once. You have trouble breathing. These symptoms may be an emergency. Get help right away. Call 911. Do not wait to see if the symptoms will go away. Do not drive yourself to the hospital. Summary Hypertension is another name for high blood pressure. High blood pressure forces your heart to work harder to pump blood. For most people, a normal blood pressure is less than 120/80. Making healthy choices can help lower blood pressure. If your blood pressure does not get lower with healthy choices, you may need to take medicine. This information is not intended to replace advice given to you by your health care provider. Make sure you discuss any questions you have with your health care provider. Document Revised: 12/18/2020 Document Reviewed: 12/18/2020 Elsevier Patient Education  2024 [employer].

## 2023-05-02 NOTE — Assessment & Plan Note (Signed)
She is currently on Xarelto, due to h/o PE. She is also followed by Hematology.

## 2023-05-02 NOTE — Assessment & Plan Note (Signed)
I will check vitamin B12 level today. December 2024 labs reviewed in detail. No need to recheck renal function today.

## 2023-05-03 LAB — VITAMIN B12: Vitamin B-12: 991 pg/mL (ref 232–1245)

## 2023-05-17 ENCOUNTER — Encounter: Payer: Self-pay | Admitting: Internal Medicine

## 2023-05-18 ENCOUNTER — Encounter: Payer: Self-pay | Admitting: Internal Medicine

## 2023-05-18 ENCOUNTER — Ambulatory Visit (INDEPENDENT_AMBULATORY_CARE_PROVIDER_SITE_OTHER): Payer: Self-pay | Admitting: Internal Medicine

## 2023-05-18 VITALS — BP 118/78 | HR 83 | Temp 98.4°F | Ht 63.0 in | Wt 227.2 lb

## 2023-05-18 DIAGNOSIS — R0981 Nasal congestion: Secondary | ICD-10-CM

## 2023-05-18 DIAGNOSIS — I10 Essential (primary) hypertension: Secondary | ICD-10-CM

## 2023-05-18 MED ORDER — TRIAMCINOLONE ACETONIDE 40 MG/ML IJ SUSP
60.0000 mg | Freq: Once | INTRAMUSCULAR | Status: AC
  Administered 2023-05-18: 60 mg via INTRAMUSCULAR

## 2023-05-18 MED ORDER — MOMETASONE FUROATE 50 MCG/ACT NA SUSP: 2.0000 | Freq: Every day | NASAL | 12 refills | Status: AC

## 2023-05-18 MED ORDER — NOREL AD 4-10-325 MG PO TABS: ORAL_TABLET | ORAL | 0 refills | Status: DC

## 2023-05-18 NOTE — Patient Instructions (Addendum)
 Sinus Pain  Sinus pain may occur when your sinuses become clogged or swollen. Sinuses are air-filled spaces in your skull that are behind the bones of your face and forehead. Sinus pain can range from mild to severe. What are the causes? Sinus pain can result from various conditions that affect the sinuses. Common causes include: Colds. Sinus infections. Allergies. What are the signs or symptoms? The main symptom of this condition is pain or pressure in your face, forehead, ears, or upper teeth. People who have sinus pain often have other symptoms, such as: Congested or runny nose. Fever. Inability to smell. Headache. Weather changes can make symptoms worse. How is this diagnosed? Your health care provider will diagnose this condition based on your symptoms and a physical exam. If you have pain that keeps coming back or does not go away, your health care provider may recommend more testing. This may include: Imaging tests, such as a CT scan or MRI, to check for problems with your sinuses. Examination of your sinuses using a thin tool with a camera that is inserted through your nose (endoscopy). How is this treated? Treatment for this condition depends on the cause. Sinus pain that is caused by a sinus infection may be treated with antibiotic medicine. Sinus pain that is caused by congestion may be helped by rinsing out (flushing) the nose and sinuses with saline solution. Sinus pain that is caused by allergies may be helped by allergy medicines (antihistamines) and medicated nasal sprays. Sinus surgery may be needed in some cases if other treatments do not help. Follow these instructions at home: General instructions If directed: Apply a warm, moist washcloth to your face to help relieve pain. Use a nasal saline wash. Follow the directions on the bottle or box. Hydrate and humidify Drink enough water to keep your urine clear or pale yellow. Staying hydrated will help to thin your  mucus. Use a humidifier if your home is dry. Inhale steam for 10-15 minutes, 3-4 times a day or as told by your health care provider. You can do this in the bathroom while a hot shower is running. Limit your exposure to cool or dry air. Medicines  Take over-the-counter and prescription medicines only as told by your health care provider. If you were prescribed an antibiotic medicine, take it as told by your health care provider. Do not stop taking the antibiotic even if you start to feel better. If you have congestion, use a nasal spray to help lessen pressure. Contact a health care provider if: You have sinus pain more than one time a week. You have sensitivity to light or sound. You develop a fever. You feel nauseous or you vomit. Your sinus pain or headache does not get better with treatment. Get help right away if: You have vision problems. You have sudden, severe pain in your face or head. You have a seizure. You are confused. You have a stiff neck. Summary Sinus pain occurs when your sinuses become clogged or swollen. Sinus pain can result from various conditions that affect the sinuses, such as a cold, a sinus infection, or an allergy. Treatment for this condition depends on the cause. It may include medicine, such as antibiotics or antihistamines. This information is not intended to replace advice given to you by your health care provider. Make sure you discuss any questions you have with your health care provider. Document Revised: 02/01/2021 Document Reviewed: 02/01/2021 Elsevier Patient Education  2024 [employer].

## 2023-05-18 NOTE — Assessment & Plan Note (Signed)
 Chronic, controlled.No med changes. Her BP was taken into consideration when choosing a decongestant.

## 2023-05-18 NOTE — Progress Notes (Signed)
 I,[name], CMA,acting as a Neurosurgeon for [name], MD.,have documented all relevant documentation on the behalf of [name], MD,as directed by  [name], MD while in the presence of [name], MD.  Subjective:  Patient ID: Jessica Rodgers , female    DOB: [birth year] , 48 y.o.   MRN: [id number]  No chief complaint on file.   HPI  Patient presents today complaining of sinus pressure. She states this initially started a week ago. At home, she has taken Sudafed which did help for a moment. Sunday, her symptoms came back. Now she has sinus drainage.  She denies headache, fever/chills.   She denies having any ill contacts.    Hypertension This is a chronic problem. The current episode started more than 1 year ago. The problem has been gradually improving since onset. The problem is controlled. Pertinent negatives include no palpitations or shortness of breath. Past treatments include ACE inhibitors, angiotensin blockers and diuretics. The current treatment provides moderate improvement.     Past Medical History:  Diagnosis Date   Gallstones    GERD (gastroesophageal reflux disease)    Hypertension    Pulmonary emboli (HCC)      Family History  Problem Relation Age of Onset   Hypertension Mother    Arthritis Mother    Glaucoma Mother    Other Father        unsure of his health   Breast cancer Maternal Grandmother 26   Breast cancer Paternal Grandmother      Current Outpatient Medications:    Chlorphen-PE-Acetaminophen (NOREL AD) 4-10-325 MG TABS, One tab po twice daily prn, Disp: 20 tablet, Rfl: 0   ferrous sulfate 325 (65 FE) MG EC tablet, Take 1 tablet (325 mg total) by mouth daily with breakfast., Disp: 30 tablet, Rfl: 6   mometasone (NASONEX) 50 MCG/ACT nasal spray, Place 2 sprays into the nose daily., Disp: 1 each, Rfl: 12   triamcinolone cream (KENALOG) 0.1 %, APPLY TO AFFECTED AREA TWICE A DAY AS NEEDED, Disp: 30 g, Rfl: 0    valsartan-hydrochlorothiazide (DIOVAN-HCT) 80-12.5 MG tablet, Take 1 tablet by mouth daily., Disp: 90 tablet, Rfl: 2   XARELTO 10 MG TABS tablet, TAKE 1 TABLET BY MOUTH EVERY DAY, Disp: 90 tablet, Rfl: 1   cyclobenzaprine (FLEXERIL) 10 MG tablet, Take 1 tablet (10 mg total) by mouth 2 (two) times daily as needed for muscle spasms. (Patient not taking: Reported on 05/02/2023), Disp: 20 tablet, Rfl: 0   No Known Allergies   Review of Systems  Constitutional: Negative.   HENT:  Positive for congestion.   Respiratory: Negative.  Negative for shortness of breath.   Cardiovascular: Negative.  Negative for palpitations.  Gastrointestinal: Negative.   Neurological: Negative.   Psychiatric/Behavioral: Negative.       Today's Vitals   05/18/23 1037  BP: 118/78  Pulse: 83  Temp: 98.4 F (36.9 C)  SpO2: 98%  Weight: 227 lb 3.2 oz (103.1 kg)  Height: 5\' 3"  (1.6 m)   Body mass index is 40.25 kg/m.  Wt Readings from Last 3 Encounters:  05/18/23 227 lb 3.2 oz (103.1 kg)  05/02/23 222 lb (100.7 kg)  02/28/23 224 lb (101.6 kg)     Objective:  Physical Exam Vitals and nursing note reviewed.  Constitutional:      Appearance: Normal appearance.  HENT:     Head: Normocephalic and atraumatic.     Comments: Sounds congested No frontal/maxillary tenderness to percussion  Eyes:     Extraocular Movements: Extraocular movements intact.  Cardiovascular:     Rate and Rhythm: Normal rate and regular rhythm.     Heart sounds: Normal heart sounds.  Pulmonary:     Effort: Pulmonary effort is normal.     Breath sounds: Normal breath sounds.  Musculoskeletal:     Cervical back: Normal range of motion.  Skin:    General: Skin is warm.  Neurological:     General: No focal deficit present.     Mental Status: She is alert.  Psychiatric:        Mood and Affect: Mood normal.        Behavior: Behavior normal.         Assessment And Plan:  Sinus congestion Assessment & Plan: I do not think she  has a sinus infection needing abx at this time. She was given samples/rx of Norel AD to take twice daily. She was also given Kenalog 60mg  IM x 1.   Orders: -     Triamcinolone Acetonide  Essential hypertension Assessment & Plan: Chronic, controlled.No med changes. Her BP was taken into consideration when choosing a decongestant.    Other orders -     Norel AD; One tab po twice daily prn  Dispense: 20 tablet; Refill: 0 -     Mometasone Furoate; Place 2 sprays into the nose daily.  Dispense: 1 each; Refill: 12  She is encouraged to strive for BMI less than 30 to decrease cardiac risk. Advised to aim for at least 150 minutes of exercise per week.    Return if symptoms worsen or fail to improve.  Patient was given opportunity to ask questions. Patient verbalized understanding of the plan and was able to repeat key elements of the plan. All questions were answered to their satisfaction.    I, [name], MD, have reviewed all documentation for this visit. The documentation on 05/18/23 for the exam, diagnosis, procedures, and orders are all accurate and complete.   IF YOU HAVE BEEN REFERRED TO A SPECIALIST, IT MAY TAKE 1-2 WEEKS TO SCHEDULE/PROCESS THE REFERRAL. IF YOU HAVE NOT HEARD FROM US/SPECIALIST IN TWO WEEKS, PLEASE GIVE [place] A CALL AT [phone].   THE PATIENT IS ENCOURAGED TO PRACTICE SOCIAL DISTANCING DUE TO THE COVID-19 PANDEMIC.

## 2023-05-18 NOTE — Assessment & Plan Note (Addendum)
 I do not think she has a sinus infection needing abx at this time. She was given samples/rx of Norel AD to take twice daily. She was also given Kenalog 60mg  IM x 1.

## 2023-05-25 ENCOUNTER — Inpatient Hospital Stay: Payer: BC Managed Care – PPO | Attending: Physician Assistant

## 2023-05-25 ENCOUNTER — Other Ambulatory Visit: Payer: Self-pay | Admitting: Hematology and Oncology

## 2023-05-25 DIAGNOSIS — Z86718 Personal history of other venous thrombosis and embolism: Secondary | ICD-10-CM

## 2023-05-25 DIAGNOSIS — D509 Iron deficiency anemia, unspecified: Secondary | ICD-10-CM | POA: Insufficient documentation

## 2023-05-25 DIAGNOSIS — Z86711 Personal history of pulmonary embolism: Secondary | ICD-10-CM | POA: Diagnosis present

## 2023-05-25 LAB — CMP (CANCER CENTER ONLY)
ALT: 16 U/L (ref 0–44)
AST: 20 U/L (ref 15–41)
Albumin: 3.9 g/dL (ref 3.5–5.0)
Alkaline Phosphatase: 48 U/L (ref 38–126)
Anion gap: 4 — ABNORMAL LOW (ref 5–15)
BUN: 18 mg/dL (ref 6–20)
CO2: 27 mmol/L (ref 22–32)
Calcium: 8.8 mg/dL — ABNORMAL LOW (ref 8.9–10.3)
Chloride: 105 mmol/L (ref 98–111)
Creatinine: 0.97 mg/dL (ref 0.44–1.00)
GFR, Estimated: >60 mL/min (ref >60)
Glucose, Bld: 107 mg/dL — ABNORMAL HIGH (ref 70–99)
Potassium: 4.1 mmol/L (ref 3.5–5.1)
Sodium: 136 mmol/L (ref 135–145)
Total Bilirubin: 0.5 mg/dL (ref 0.0–1.2)
Total Protein: 7.2 g/dL (ref 6.5–8.1)

## 2023-05-25 LAB — CBC WITH DIFFERENTIAL (CANCER CENTER ONLY)
Abs Immature Granulocytes: 0.02 10*3/uL (ref 0.00–0.07)
Basophils Absolute: 0 10*3/uL (ref 0.0–0.1)
Basophils Relative: 1 %
Eosinophils Absolute: 0.1 10*3/uL (ref 0.0–0.5)
Eosinophils Relative: 1 %
HCT: 39.4 % (ref 36.0–46.0)
Hemoglobin: 13.6 g/dL (ref 12.0–15.0)
Immature Granulocytes: 0 %
Lymphocytes Relative: 27 %
Lymphs Abs: 2 10*3/uL (ref 0.7–4.0)
MCH: 34.6 pg — ABNORMAL HIGH (ref 26.0–34.0)
MCHC: 34.5 g/dL (ref 30.0–36.0)
MCV: 100.3 fL — ABNORMAL HIGH (ref 80.0–100.0)
Monocytes Absolute: 0.4 10*3/uL (ref 0.1–1.0)
Monocytes Relative: 6 %
Neutro Abs: 5.1 10*3/uL (ref 1.7–7.7)
Neutrophils Relative %: 65 %
Platelet Count: 291 10*3/uL (ref 150–400)
RBC: 3.93 MIL/uL (ref 3.87–5.11)
RDW: 11.8 % (ref 11.5–15.5)
WBC Count: 7.7 10*3/uL (ref 4.0–10.5)
nRBC: 0 % (ref 0.0–0.2)

## 2023-05-25 LAB — FERRITIN: Ferritin: 23 ng/mL (ref 11–307)

## 2023-05-25 LAB — RETIC PANEL
Immature Retic Fract: 13.4 % (ref 2.3–15.9)
RBC.: 3.94 MIL/uL (ref 3.87–5.11)
Retic Count, Absolute: 95.3 10*3/uL (ref 19.0–186.0)
Retic Ct Pct: 2.4 % (ref 0.4–3.1)
Reticulocyte Hemoglobin: 36.3 pg (ref >27.9)

## 2023-05-25 LAB — IRON AND IRON BINDING CAPACITY (CC-WL,HP ONLY)
Iron: 137 ug/dL (ref 28–170)
Saturation Ratios: 34 % — ABNORMAL HIGH (ref 10.4–31.8)
TIBC: 409 ug/dL (ref 250–450)
UIBC: 272 ug/dL (ref 148–442)

## 2023-06-07 ENCOUNTER — Ambulatory Visit
Admission: EM | Admit: 2023-06-07 | Discharge: 2023-06-07 | Disposition: A | Discharge status: Home / Self Care | Attending: Family Medicine | Admitting: Family Medicine

## 2023-06-07 ENCOUNTER — Other Ambulatory Visit: Payer: Self-pay

## 2023-06-07 VITALS — BP 143/90 | HR 81 | Temp 98.4°F | Resp 18

## 2023-06-07 DIAGNOSIS — L03031 Cellulitis of right toe: Secondary | ICD-10-CM | POA: Diagnosis not present

## 2023-06-07 MED ORDER — LIDOCAINE-EPINEPHRINE-TETRACAINE (LET) TOPICAL GEL
3.0000 mL | Freq: Once | TOPICAL | Status: AC
  Administered 2023-06-07: 3 mL via TOPICAL

## 2023-06-07 MED ORDER — SULFAMETHOXAZOLE-TRIMETHOPRIM 800-160 MG PO TABS: 1.0000 | ORAL_TABLET | Freq: Two times a day (BID) | ORAL | 0 refills | Status: DC

## 2023-06-07 NOTE — Discharge Instructions (Addendum)
 Change dressing in today in 6 hours and cleanse area and apply new dressing.  Morrow morning wash and apply another bulky dressing by tomorrow might transition to a regular Band-Aid.  To see drainage on the dressing as there still pus the area that is actively draining.  Start antibiotics take twice daily once with breakfast and once with dinner until medication is complete.  Also included information to follow-up with podiatry if the toenail continues to grow in an irregular pattern for evaluation and treatment of ingrown toenail.

## 2023-06-07 NOTE — ED Provider Notes (Signed)
 [facility]    CSN: [id number] Arrival date & time: 06/07/23  0957      History   Chief Complaint Chief Complaint  Patient presents with   Toe Pain    HPI Jessica Rodgers is a 48 y.o. female.  Patient presents today with right great toe pain.  Pain is exacerbated over the last 3 days.  Patient is concerned she may have an ingrown toenail and current symptoms. Patient recently had a pedicure and reports mild discomfort after getting her toe done. Over the last three days she has noticed an area on corner of toe nail that been filling with pus. She has soaked her foot without resolution of symptoms.  Patient is a diabetic however is on Eliquis chronically for a PE.   Past Medical History:  Diagnosis Date   Gallstones    GERD (gastroesophageal reflux disease)    Hypertension    Pulmonary emboli (HCC)     Patient Active Problem List   Diagnosis Date Noted   Sinus congestion 05/18/2023   Elevated MCV 05/02/2023   Fatigue 02/28/2023   Irregular intermenstrual bleeding 02/28/2023   Acquired thrombophilia (HCC) 10/30/2022   Encounter for general adult medical examination w/o abnormal findings 10/27/2022   Anxiety 10/27/2022   Perimenopausal 10/27/2022   History of pulmonary embolism 02/23/2022   History of DVT (deep vein thrombosis) 02/23/2022   Iron deficiency anemia 12/04/2021   Normocytic anemia 02/27/2021   Pulmonary embolism (HCC) 10/07/2020   DVT (deep venous thrombosis) (HCC) 08/17/2020   AKI (acute kidney injury) (HCC) 08/15/2020   Essential hypertension 08/15/2020   Obesity (BMI 35.0-39.9 without comorbidity) 08/15/2020   History of colonic polyps 06/18/2020   Obesity with body mass index 30 or greater 10/31/2017   Irregular periods 10/31/2017   Smoker 10/31/2017   Urinary tract infection, site not specified 09/19/2017   Enterocolitis 11/06/2016   Sepsis (HCC) 11/06/2016   Class 2 severe obesity due to excess calories with serious comorbidity  and body mass index (BMI) of 39.0 to 39.9 in adult [facility]) 11/06/2016   Pyelonephritis 11/05/2016    Past Surgical History:  Procedure Laterality Date   BREAST EXCISIONAL BIOPSY Right 04/07/2017   PASH   RADIOACTIVE SEED GUIDED EXCISIONAL BREAST BIOPSY Right 04/07/2017   Procedure: RIGHT RADIOACTIVE SEED GUIDED EXCISIONAL BREAST BIOPSY;  Surgeon: Emelia Loron, MD;  Location: [facility];  Service: General;  Laterality: Right;   TUBAL LIGATION  1997    OB History   No obstetric history on file.      Home Medications    Prior to Admission medications   Medication Sig Start Date End Date Taking? Authorizing Provider  sulfamethoxazole-trimethoprim (BACTRIM DS) 800-160 MG tablet Take 1 tablet by mouth 2 (two) times daily. 06/07/23  Yes Bing Neighbors, NP  Chlorphen-PE-Acetaminophen (NOREL AD) 4-10-325 MG TABS One tab po twice daily prn 05/18/23   Dorothyann Peng, MD  ferrous sulfate 325 (65 FE) MG EC tablet Take 1 tablet (325 mg total) by mouth daily with breakfast. 08/24/22   Georga Kaufmann T, PA-C  mometasone (NASONEX) 50 MCG/ACT nasal spray Place 2 sprays into the nose daily. 05/18/23   Dorothyann Peng, MD  triamcinolone cream (KENALOG) 0.1 % APPLY TO AFFECTED AREA TWICE A DAY AS NEEDED 07/02/22   Dorothyann Peng, MD  valsartan-hydrochlorothiazide (DIOVAN-HCT) 80-12.5 MG tablet Take 1 tablet by mouth daily. 05/02/23   Dorothyann Peng, MD  XARELTO 10 MG TABS tablet TAKE 1 TABLET BY MOUTH EVERY DAY  04/06/23   Dorothyann Peng, MD    Family History Family History  Problem Relation Age of Onset   Hypertension Mother    Arthritis Mother    Glaucoma Mother    Other Father        unsure of his health   Breast cancer Maternal Grandmother 48   Breast cancer Paternal Grandmother     Social History Social History   Tobacco Use   Smoking status: Former    Current packs/day: 0.00    Average packs/day: 0.5 packs/day for 18.0 years (9.0 ttl pk-yrs)    Types: Cigarettes     Start date: 02/13/2003    Quit date: 08/09/2020    Years since quitting: 2.8   Smokeless tobacco: Never  Vaping Use   Vaping status: Never Used  Substance Use Topics   Alcohol use: Yes    Alcohol/week: 4.0 standard drinks of alcohol    Types: 3 Glasses of wine, 1 Standard drinks or equivalent per week    Comment: Occassionally   Drug use: No     Allergies   Patient has no known allergies.   Review of Systems Review of Systems Pertinent negatives listed in HPI  Physical Exam Triage Vital Signs ED Triage Vitals [06/07/23 1018]  Encounter Vitals Group     BP (!) 143/90     Systolic BP Percentile      Diastolic BP Percentile      Pulse Rate 81     Resp 18     Temp 98.4 F (36.9 C)     Temp Source Oral     SpO2 96 %     Weight      Height      Head Circumference      Peak Flow      Pain Score 4     Pain Loc      Pain Education      Exclude from Growth Chart    No data found.  Updated Vital Signs BP (!) 143/90 (BP Location: Left Arm)   Pulse 81   Temp 98.4 F (36.9 C) (Oral)   Resp 18   SpO2 96%   Visual Acuity Right Eye Distance:   Left Eye Distance:   Bilateral Distance:    Right Eye Near:   Left Eye Near:    Bilateral Near:     Physical Exam Vitals reviewed.  Constitutional:      Appearance: Normal appearance.  HENT:     Head: Normocephalic and atraumatic.  Cardiovascular:     Rate and Rhythm: Normal rate and regular rhythm.  Pulmonary:     Effort: Pulmonary effort is normal.     Breath sounds: Normal breath sounds.  Musculoskeletal:        General: Normal range of motion.     Cervical back: Normal range of motion and neck supple.  Skin:    General: Skin is warm and dry.  Neurological:     General: No focal deficit present.     Mental Status: She is alert and oriented to person, place, and time.    UC Treatments / Results  Labs (all labs ordered are listed, but only abnormal results are displayed) Labs Reviewed - No data to  display  EKG   Radiology No results found.  Procedures Join Aspiration/Injection  Date/Time: 06/07/2023 11:11 AM  Performed by: Bing Neighbors, NP Authorized by: Bing Neighbors, NP   Consent:    Consent obtained:  Verbal  Consent given by:  Patient   Risks discussed:  Bleeding and infection Universal protocol:    Patient identity confirmed:  Verbally with patient Location:    Location: Great toe Right. Anesthesia:    Anesthesia method:  Topical application Procedure details:    Preparation: Patient was prepped and draped in usual sterile fashion     Needle gauge:  18 G   Approach:  Anterior   Aspirate characteristics:  Purulent   Steroid injected: no     Specimen collected: no   Post-procedure details:    Dressing:  Adhesive bandage   Procedure completion:  Tolerated  (including critical care time)  Medications Ordered in UC Medications  lidocaine-EPINEPHrine-tetracaine (LET) topical gel (3 mLs Topical Given 06/07/23 1036)    Initial Impression / Assessment and Plan / UC Course  I have reviewed the triage vital signs and the nursing notes.  Pertinent labs & imaging results that were available during my care of the patient were reviewed by me and considered in my medical decision making (see chart for details).    Paronychia of the great toe of the right foot,  Paronychia opened with an 18-gauge needle and drainage aspirated, Paronychia continues to mildly drain, toe placed in a pressure dressing.  Wound care instructions provided to patient.  Will treat infection with Bactrim twice daily for total of 10 days. Patient provided information to follow-up with [facility] if toe continues to grow in a regular pattern for further management.  Patient verbalized understanding and agreement with plan. Final Clinical Impressions(s) / UC Diagnoses   Final diagnoses:  Paronychia of great toe of right foot     Discharge Instructions      Change  dressing in today in 6 hours and cleanse area and apply new dressing.  Morrow morning wash and apply another bulky dressing by tomorrow might transition to a regular Band-Aid.  To see drainage on the dressing as there still pus the area that is actively draining.  Start antibiotics take twice daily once with breakfast and once with dinner until medication is complete.  Also included information to follow-up with podiatry if the toenail continues to grow in an irregular pattern for evaluation and treatment of ingrown toenail.     ED Prescriptions     Medication Sig Dispense Auth. Provider   sulfamethoxazole-trimethoprim (BACTRIM DS) 800-160 MG tablet Take 1 tablet by mouth 2 (two) times daily. 20 tablet Bing Neighbors, NP      PDMP not reviewed this encounter.   Bing Neighbors, NP 06/07/23 [phone]

## 2023-06-07 NOTE — ED Triage Notes (Signed)
 Pt here for possible ingrown toenail on right great toe; pt sts increased pain x 3 days

## 2023-08-16 ENCOUNTER — Other Ambulatory Visit: Payer: Self-pay | Admitting: Physician Assistant

## 2023-08-16 DIAGNOSIS — D508 Other iron deficiency anemias: Secondary | ICD-10-CM

## 2023-08-16 DIAGNOSIS — Z86718 Personal history of other venous thrombosis and embolism: Secondary | ICD-10-CM

## 2023-08-17 ENCOUNTER — Inpatient Hospital Stay (HOSPITAL_BASED_OUTPATIENT_CLINIC_OR_DEPARTMENT_OTHER): Admitting: Physician Assistant

## 2023-08-17 ENCOUNTER — Inpatient Hospital Stay: Attending: Physician Assistant

## 2023-08-17 VITALS — BP 134/92 | HR 71 | Temp 97.7°F | Resp 18 | Ht 63.0 in | Wt 225.3 lb

## 2023-08-17 DIAGNOSIS — N920 Excessive and frequent menstruation with regular cycle: Secondary | ICD-10-CM | POA: Diagnosis present

## 2023-08-17 DIAGNOSIS — Z803 Family history of malignant neoplasm of breast: Secondary | ICD-10-CM | POA: Insufficient documentation

## 2023-08-17 DIAGNOSIS — Z86711 Personal history of pulmonary embolism: Secondary | ICD-10-CM

## 2023-08-17 DIAGNOSIS — Z86718 Personal history of other venous thrombosis and embolism: Secondary | ICD-10-CM | POA: Insufficient documentation

## 2023-08-17 DIAGNOSIS — D508 Other iron deficiency anemias: Secondary | ICD-10-CM | POA: Diagnosis not present

## 2023-08-17 DIAGNOSIS — Z87891 Personal history of nicotine dependence: Secondary | ICD-10-CM | POA: Diagnosis not present

## 2023-08-17 DIAGNOSIS — D509 Iron deficiency anemia, unspecified: Secondary | ICD-10-CM | POA: Diagnosis present

## 2023-08-17 DIAGNOSIS — Z79899 Other long term (current) drug therapy: Secondary | ICD-10-CM | POA: Insufficient documentation

## 2023-08-17 LAB — CMP (CANCER CENTER ONLY)
ALT: 14 U/L (ref 0–44)
AST: 19 U/L (ref 15–41)
Albumin: 3.9 g/dL (ref 3.5–5.0)
Alkaline Phosphatase: 53 U/L (ref 38–126)
Anion gap: 7 (ref 5–15)
BUN: 18 mg/dL (ref 6–20)
CO2: 26 mmol/L (ref 22–32)
Calcium: 9.4 mg/dL (ref 8.9–10.3)
Chloride: 105 mmol/L (ref 98–111)
Creatinine: 1 mg/dL (ref 0.44–1.00)
GFR, Estimated: >60 mL/min (ref >60)
Glucose, Bld: 107 mg/dL — ABNORMAL HIGH (ref 70–99)
Potassium: 3.8 mmol/L (ref 3.5–5.1)
Sodium: 138 mmol/L (ref 135–145)
Total Bilirubin: 0.4 mg/dL (ref 0.0–1.2)
Total Protein: 7.5 g/dL (ref 6.5–8.1)

## 2023-08-17 LAB — FERRITIN: Ferritin: 48 ng/mL (ref 11–307)

## 2023-08-17 LAB — CBC WITH DIFFERENTIAL (CANCER CENTER ONLY)
Abs Immature Granulocytes: 0.02 10*3/uL (ref 0.00–0.07)
Basophils Absolute: 0 10*3/uL (ref 0.0–0.1)
Basophils Relative: 0 %
Eosinophils Absolute: 0.1 10*3/uL (ref 0.0–0.5)
Eosinophils Relative: 1 %
HCT: 41.6 % (ref 36.0–46.0)
Hemoglobin: 14 g/dL (ref 12.0–15.0)
Immature Granulocytes: 0 %
Lymphocytes Relative: 27 %
Lymphs Abs: 1.7 10*3/uL (ref 0.7–4.0)
MCH: 34.2 pg — ABNORMAL HIGH (ref 26.0–34.0)
MCHC: 33.7 g/dL (ref 30.0–36.0)
MCV: 101.7 fL — ABNORMAL HIGH (ref 80.0–100.0)
Monocytes Absolute: 0.4 10*3/uL (ref 0.1–1.0)
Monocytes Relative: 7 %
Neutro Abs: 4.1 10*3/uL (ref 1.7–7.7)
Neutrophils Relative %: 65 %
Platelet Count: 266 10*3/uL (ref 150–400)
RBC: 4.09 MIL/uL (ref 3.87–5.11)
RDW: 11.4 % — ABNORMAL LOW (ref 11.5–15.5)
WBC Count: 6.4 10*3/uL (ref 4.0–10.5)
nRBC: 0 % (ref 0.0–0.2)

## 2023-08-17 LAB — IRON AND IRON BINDING CAPACITY (CC-WL,HP ONLY)
Iron: 117 ug/dL (ref 28–170)
Saturation Ratios: 28 % (ref 10.4–31.8)
TIBC: 414 ug/dL (ref 250–450)
UIBC: 297 ug/dL (ref 148–442)

## 2023-08-17 NOTE — Progress Notes (Signed)
 [facility] [phone]   [phone]  PROGRESS NOTE  Patient Care Team: Cleave Curling, MD as PCP - General (Internal Medicine) Sheryle Donning, MD as PCP - Cardiology (Cardiology) Atlas Blank, DO as Consulting Physician (Obstetrics and Gynecology)  Hematological/Oncological History 1) 08/15/2020-08/18/2020: Admitted after presenting with increasing shortness of breath. CTA chest showed extensive bilateral pulmonary emboli with associated right heart strain. Doppler US  revealed age indeterminate DVT involving the left popliteal vein and let peroneal veins.Treated with heparin  drip and discharged home on Xarelto .   2) 10/28/2020: Establish care with Wyline Hearing PA-C  3) 12/09/2021 and 12/16/2021: Received IV feraheme 510 mg weekly x 2 doses  4) 06/03/2022-06/10/2022: Received IV feraheme 510 mg weekly x 2 doses.    CHIEF COMPLAINTS/PURPOSE OF CONSULTATION:  Bilateral Pulmonary Emboli and Left LE DVT Iron deficiency anemia  HISTORY OF PRESENTING ILLNESS:  Jessica Rodgers 48 y.o. female who presents to the clinic for a follow-up for history of pulmonary emboli and DVT along with iron deficiency anemia. She was last seen by Rande Bushy, NP on 02/24/2023. In the interim, she denies any changes to her health.   On exam today, Jessica Rodgers reports her energy levels are overall stable and she is able to complete her ADLs without difficulty. She has a good appetite without any weight changes. She denies nausea, vomiting or bowel habit changes. She denies easy bruising or signs of overt bleeding. Her menstrual cycles are becoming more irregular as she didn't have a cycle last month. She suspects she is enter menopause and plans to closely follow up with her gynecologist. She reports no signs or symptoms of thrombosis including leg pain, leg edema, shortness of breath or chest pain. She is compliant with taking Xarelto  and iron supplementation as  prescribed. She has no other complaints. Rest of the 10 point ROS is below.   MEDICAL HISTORY:  Past Medical History:  Diagnosis Date   Gallstones    GERD (gastroesophageal reflux disease)    Hypertension    Pulmonary emboli (HCC)     SURGICAL HISTORY: Past Surgical History:  Procedure Laterality Date   BREAST EXCISIONAL BIOPSY Right 04/07/2017   PASH   RADIOACTIVE SEED GUIDED EXCISIONAL BREAST BIOPSY Right 04/07/2017   Procedure: RIGHT RADIOACTIVE SEED GUIDED EXCISIONAL BREAST BIOPSY;  Surgeon: Enid Harry, MD;  Location: [facility];  Service: General;  Laterality: Right;   TUBAL LIGATION  1997    SOCIAL HISTORY: Social History   Socioeconomic History   Marital status: Divorced    Spouse name: Not on file   Number of children: Not on file   Years of education: Not on file   Highest education level: Not on file  Occupational History   Not on file  Tobacco Use   Smoking status: Former    Current packs/day: 0.00    Average packs/day: 0.5 packs/day for 18.0 years (9.0 ttl pk-yrs)    Types: Cigarettes    Start date: 02/13/2003    Quit date: 08/09/2020    Years since quitting: 3.0   Smokeless tobacco: Never  Vaping Use   Vaping status: Never Used  Substance and Sexual Activity   Alcohol use: Yes    Alcohol/week: 4.0 standard drinks of alcohol    Types: 3 Glasses of wine, 1 Standard drinks or equivalent per week    Comment: Occassionally   Drug use: No   Sexual activity: Not Currently  Other Topics Concern   Not on file  Social  History Narrative   Not on file   Social Drivers of Health   Financial Resource Strain: Not on file  Food Insecurity: Not on file  Transportation Needs: Not on file  Physical Activity: Not on file  Stress: Not on file  Social Connections: Not on file  Intimate Partner Violence: Not on file    FAMILY HISTORY: Family History  Problem Relation Age of Onset   Hypertension Mother    Arthritis Mother    Glaucoma  Mother    Other Father        unsure of his health   Breast cancer Maternal Grandmother 31   Breast cancer Paternal Grandmother     ALLERGIES:  has no known allergies.  MEDICATIONS:  Current Outpatient Medications  Medication Sig Dispense Refill   ferrous sulfate  325 (65 FE) MG EC tablet Take 1 tablet (325 mg total) by mouth daily with breakfast. 30 tablet 6   mometasone  (NASONEX ) 50 MCG/ACT nasal spray Place 2 sprays into the nose daily. 1 each 12   triamcinolone  cream (KENALOG ) 0.1 % APPLY TO AFFECTED AREA TWICE A DAY AS NEEDED 30 g 0   valsartan -hydrochlorothiazide  (DIOVAN -HCT) 80-12.5 MG tablet Take 1 tablet by mouth daily. 90 tablet 2   XARELTO  10 MG TABS tablet TAKE 1 TABLET BY MOUTH EVERY DAY 90 tablet 1   Chlorphen-PE-Acetaminophen  (NOREL AD) 4-10-325 MG TABS One tab po twice daily prn (Patient not taking: Reported on 08/17/2023) 20 tablet 0   sulfamethoxazole -trimethoprim  (BACTRIM  DS) 800-160 MG tablet Take 1 tablet by mouth 2 (two) times daily. (Patient not taking: Reported on 08/17/2023) 20 tablet 0   No current facility-administered medications for this visit.    REVIEW OF SYSTEMS:   Constitutional: ( - ) fevers, ( - )  chills , ( - ) night sweats Eyes: ( - ) blurriness of vision, ( - ) double vision, ( - ) watery eyes Ears, nose, mouth, throat, and face: ( - ) mucositis, ( - ) sore throat Respiratory: ( - ) cough, ( - ) dyspnea, ( - ) wheezes Cardiovascular: ( - ) palpitation, ( - ) chest discomfort, ( - ) lower extremity swelling Gastrointestinal:  ( - ) nausea, ( - ) heartburn, ( - ) change in bowel habits Skin: ( - ) abnormal skin rashes Lymphatics: ( - ) new lymphadenopathy, ( - ) easy bruising Neurological: ( - ) numbness, ( - ) tingling, ( - ) new weaknesses Behavioral/Psych: ( - ) mood change, ( - ) new changes  All other systems were reviewed with the patient and are negative.  PHYSICAL EXAMINATION: ECOG PERFORMANCE STATUS: 0 - Asymptomatic  Vitals:   08/17/23  0819  BP: (!) 134/92  Pulse: 71  Resp: 18  Temp: 97.7 F (36.5 C)  SpO2: 100%    Filed Weights   08/17/23 0819  Weight: 225 lb 4.8 oz (102.2 kg)     GENERAL: well appearing female in NAD  SKIN: skin color, texture, turgor are normal, no rashes or significant lesions EYES: conjunctiva are pink and non-injected, sclera clear LUNGS: clear to auscultation and percussion with normal breathing effort HEART: regular rate & rhythm and no murmurs and no lower extremity edema Musculoskeletal: no cyanosis of digits and no clubbing  PSYCH: alert & oriented x 3, fluent speech NEURO: no focal motor/sensory deficits  LABORATORY DATA:  I have reviewed the data as listed    Latest Ref Rng & Units 08/17/2023    7:59 AM 05/25/2023  8:06 AM 02/24/2023    8:12 AM  CBC  WBC 4.0 - 10.5 K/uL 6.4  7.7  6.1   Hemoglobin 12.0 - 15.0 g/dL 81.1  91.4  78.2   Hematocrit 36.0 - 46.0 % 41.6  39.4  40.3   Platelets 150 - 400 K/uL 266  291  265        Latest Ref Rng & Units 08/17/2023    7:59 AM 05/25/2023    8:06 AM 02/24/2023    8:12 AM  CMP  Glucose 70 - 99 mg/dL 956  213  97   BUN 6 - 20 mg/dL 18  18  16    Creatinine 0.44 - 1.00 mg/dL 0.86  5.78  4.69   Sodium 135 - 145 mmol/L 138  136  139   Potassium 3.5 - 5.1 mmol/L 3.8  4.1  4.2   Chloride 98 - 111 mmol/L 105  105  106   CO2 22 - 32 mmol/L 26  27  27    Calcium 8.9 - 10.3 mg/dL 9.4  8.8  9.7   Total Protein 6.5 - 8.1 g/dL 7.5  7.2  7.8   Total Bilirubin 0.0 - 1.2 mg/dL 0.4  0.5  0.4   Alkaline Phos 38 - 126 U/L 53  48  51   AST 15 - 41 U/L 19  20  21    ALT 0 - 44 U/L 14  16  14     RADIOGRAPHIC STUDIES: I have personally reviewed the radiological images as listed and agreed with the findings in the report.  08/15/2020: CTA Chest: 1. Extensive bilateral pulmonary embolism with associated right heart strain. 2. Cholelithiasis.  08/16/2020: Dopper US  Bilateral LE: Findings consistent with age indeterminate deep vein thrombosis   involving the left popliteal vein, and left peroneal veins.   ASSESSMENT & PLAN Jessica Rodgers is a 48 y.o. female for a follow up for bilateral extensive pulmonary emboli and left lower extremity DVT that was diagnosed in June 2022.   #Extensive bilateral pulmonary emboli and LLE DVT: -CTA chest from 08/15/2020 showed extensive bilateral pulmonary embolism with associated right heart strain. Doppler US  on 08/16/2020 showed DVT involving the left popliteal vein and left peroneal veins.  --Risk factors include smoking and sedentary lifestyle. Patient continues to smoke occasionally and sits for long periods of time when she works.  --Hypercoagulable workup was negative for any clotting disorders.  --After six months of anticoagulation, we discussed risks versus benefits of continuing on Xarelto . Since patient reports that she has a sedentary lifestyle and smokes intermittently, we decided to continue on anticoagulation but transition to maintenance dose.  --continue with maintenance dose of Xarelto  10 mg once daily  #Iron deficiency anemia 2/2 GYN bleeding: --Last received IV feraheme 510 mg x 2 doses on 06/03/2022 and 06/10/2022 --Labs today shows no evidence of anemia with Hgb 14.0, MCV 101.7  Iron panel shows no deficiency with iron 117, saturation 28%, ferritin pending --No need for additional IV iron at this time.  --Continue ferrous sulfate  325 mg once a day with a source of vitamin C.   --Continue to incorporate iron rich foods into diet.  --Continue with scheduled follow ups with gynecologist.    Follow up: -6 months: labs and f/u visit  No orders of the defined types were placed in this encounter.  All questions were answered. The patient knows to call the clinic with any problems, questions or concerns.  I have spent a total of 25 minutes minutes  of face-to-face and non-face-to-face time, preparing to see the patient,performing a medically appropriate examination, counseling and  educating the patient, ordering tests/meds, documenting clinical information in the electronic health record, and care coordination.   Wyline Hearing PA-C Dept of Hematology and Oncology [facility] Phone: [phone]

## 2023-08-24 ENCOUNTER — Other Ambulatory Visit: Payer: BC Managed Care – PPO

## 2023-08-24 ENCOUNTER — Ambulatory Visit: Payer: BC Managed Care – PPO | Admitting: Physician Assistant

## 2023-09-29 ENCOUNTER — Other Ambulatory Visit: Payer: Self-pay | Admitting: Internal Medicine

## 2023-11-02 ENCOUNTER — Encounter: Payer: BC Managed Care – PPO | Admitting: Internal Medicine

## 2023-12-14 ENCOUNTER — Encounter: Payer: Self-pay | Admitting: Internal Medicine

## 2023-12-14 ENCOUNTER — Ambulatory Visit: Admitting: Internal Medicine

## 2023-12-14 ENCOUNTER — Ambulatory Visit: Payer: Self-pay | Admitting: Internal Medicine

## 2023-12-14 VITALS — BP 122/80 | HR 68 | Temp 98.1°F | Ht 63.0 in | Wt 231.0 lb

## 2023-12-14 DIAGNOSIS — E66813 Obesity, class 3: Secondary | ICD-10-CM | POA: Diagnosis not present

## 2023-12-14 DIAGNOSIS — I10 Essential (primary) hypertension: Secondary | ICD-10-CM

## 2023-12-14 DIAGNOSIS — Z860109 Personal history of other colon polyps: Secondary | ICD-10-CM

## 2023-12-14 DIAGNOSIS — N951 Menopausal and female climacteric states: Secondary | ICD-10-CM | POA: Diagnosis not present

## 2023-12-14 DIAGNOSIS — L2481 Irritant contact dermatitis due to metals: Secondary | ICD-10-CM | POA: Insufficient documentation

## 2023-12-14 DIAGNOSIS — Z0000 Encounter for general adult medical examination without abnormal findings: Secondary | ICD-10-CM | POA: Diagnosis not present

## 2023-12-14 DIAGNOSIS — Z6841 Body Mass Index (BMI) 40.0 and over, adult: Secondary | ICD-10-CM

## 2023-12-14 LAB — POCT URINALYSIS DIP (CLINITEK)
Bilirubin, UA: NEGATIVE
Blood, UA: NEGATIVE
Glucose, UA: NEGATIVE mg/dL
Ketones, POC UA: NEGATIVE mg/dL
Leukocytes, UA: NEGATIVE
Nitrite, UA: NEGATIVE
POC PROTEIN,UA: NEGATIVE
Spec Grav, UA: 1.025 (ref 1.010–1.025)
Urobilinogen, UA: 0.2 U/dL
pH, UA: 5.5 (ref 5.0–8.0)

## 2023-12-14 MED ORDER — TRIAMCINOLONE ACETONIDE 0.1 % EX CREA: TOPICAL_CREAM | CUTANEOUS | 0 refills | Status: AC

## 2023-12-14 NOTE — Assessment & Plan Note (Signed)
 Chronic, fair control.  Goal BP<130/80.  EKG performed, NSR w/ nonspecific T abnormality.  She will continue with valsartan /hct 80/12.5mg  daily for now.  - Follow low sodium diet - Increase daily activity, aiming for at least 150 minutes of exercise per week

## 2023-12-14 NOTE — Patient Instructions (Signed)

## 2023-12-14 NOTE — Assessment & Plan Note (Signed)
 Recurrent dermatitis likely due to contact irritants such as costume jewelry. Managed with steroid cream and rubbing alcohol for symptomatic relief. - Refill steroid cream prescription to have on file.

## 2023-12-14 NOTE — Assessment & Plan Note (Signed)
 Experiencing perimenopausal symptoms including irregular menstruation, night sweats, and irritability. Discussed the natural course of menopause and its effects on cholesterol levels. - Encouraged to increase intake of cruciferous veggies to help with hormones

## 2023-12-14 NOTE — Assessment & Plan Note (Signed)

## 2023-12-14 NOTE — Assessment & Plan Note (Signed)
 She is aware colonoscopy is now due.  - She will schedule with [facility] once balance is paid.

## 2023-12-14 NOTE — Progress Notes (Signed)
 I,Victoria T Emmitt, CMA,acting as a Neurosurgeon for Catheryn LOISE Slocumb, MD.,have documented all relevant documentation on the behalf of Catheryn LOISE Slocumb, MD,as directed by  Catheryn LOISE Slocumb, MD while in the presence of Catheryn LOISE Slocumb, MD.  Subjective:    Patient ID: Jessica Rodgers , female    DOB: 1975-06-30 , 48 y.o.   MRN: [id number]  Chief Complaint  Patient presents with   Annual Exam    She is here today for a full physical examination. She is followed by GYN, Donna Pang for her pelvic exams. She reports compliance with meds. She denies headaches, chest pain and palpitations.  She will soon schedule colonoscopy.     Hypertension    HPI Discussed the use of AI scribe software for clinical note transcription with the patient, who gave verbal consent to proceed.  History of Present Illness Jessica Rodgers is a 48 year old female who presents for a routine physical exam and blood pressure check.  She has a history of hypertension and is currently taking valsartan  80/12.5 mg. She is not engaging in any exercise at present. Her last cholesterol check showed a low-density lipoprotein (LDL) level of 99.  She has a history of pulmonary embolism and is currently on Xarelto  for anticoagulation.  She is experiencing perimenopausal symptoms, including irregular menstruation, night sweats, and irritability. Her last menstrual period was in August. She was previously taking iron supplements during her menstrual cycles but has stopped since her periods have become irregular.  She had a previous colonoscopy where a polyp was removed. She has an Marine scientist with the facility, which she plans to address before scheduling her next appointment.  She uses Nasonex  as needed and has a history of skin irritation, for which she uses a steroid cream periodically. She avoids costume jewelry as it seems to irritate her skin.   Hypertension This is a chronic problem. The current episode started  more than 1 year ago. The problem has been gradually improving since onset. The problem is controlled. Pertinent negatives include no blurred vision. Risk factors for coronary artery disease include post-menopausal state. The current treatment provides moderate improvement. Compliance problems include exercise.  There is no history of kidney disease or CAD/MI.     Past Medical History:  Diagnosis Date   Gallstones    GERD (gastroesophageal reflux disease)    Hypertension    Pulmonary emboli (HCC)      Family History  Problem Relation Age of Onset   Hypertension Mother    Arthritis Mother    Glaucoma Mother    Other Father        unsure of his health   Breast cancer Maternal Grandmother 93   Breast cancer Paternal Grandmother      Current Outpatient Medications:    ferrous sulfate  325 (65 FE) MG EC tablet, Take 1 tablet (325 mg total) by mouth daily with breakfast., Disp: 30 tablet, Rfl: 6   mometasone  (NASONEX ) 50 MCG/ACT nasal spray, Place 2 sprays into the nose daily., Disp: 1 each, Rfl: 12   valsartan -hydrochlorothiazide  (DIOVAN -HCT) 80-12.5 MG tablet, Take 1 tablet by mouth daily., Disp: 90 tablet, Rfl: 2   XARELTO  10 MG TABS tablet, TAKE 1 TABLET BY MOUTH EVERY DAY, Disp: 90 tablet, Rfl: 1   triamcinolone  cream (KENALOG ) 0.1 %, APPLY TO AFFECTED AREA TWICE A DAY AS NEEDED, Disp: 30 g, Rfl: 0   No Known Allergies    The patient states she uses none for birth  control. No LMP recorded.. Negative for Dysmenorrhea. Negative for: breast discharge, breast lump(s), breast pain and breast self exam. Associated symptoms include abnormal vaginal bleeding. Pertinent negatives include abnormal bleeding (hematology), anxiety, decreased libido, depression, difficulty falling sleep, dyspareunia, history of infertility, nocturia, sexual dysfunction, sleep disturbances, urinary incontinence, urinary urgency, vaginal discharge and vaginal itching. Diet regular.The patient states her exercise level  is  intermittent.   . The patient's tobacco use is:  Social History   Tobacco Use  Smoking Status Former   Current packs/day: 0.00   Average packs/day: 0.5 packs/day for 18.0 years (9.0 ttl pk-yrs)   Types: Cigarettes   Start date: 02/13/2003   Quit date: 08/09/2020   Years since quitting: 3.3  Smokeless Tobacco Never  . She has been exposed to passive smoke. The patient's alcohol use is:  Social History   Substance and Sexual Activity  Alcohol Use Yes   Alcohol/week: 4.0 standard drinks of alcohol   Types: 3 Glasses of wine, 1 Standard drinks or equivalent per week   Comment: Occassionally    Review of Systems  Constitutional: Negative.   HENT: Negative.    Eyes: Negative.  Negative for blurred vision.  Respiratory: Negative.    Cardiovascular: Negative.   Gastrointestinal: Negative.   Endocrine: Negative.   Genitourinary: Negative.   Musculoskeletal: Negative.   Skin: Negative.   Allergic/Immunologic: Negative.   Neurological: Negative.   Hematological: Negative.   Psychiatric/Behavioral: Negative.       Today's Vitals   12/14/23 0910  BP: 122/80  Pulse: 68  Temp: 98.1 F (36.7 C)  SpO2: 98%  Weight: 231 lb (104.8 kg)  Height: 5' 3 (1.6 m)   Body mass index is 40.92 kg/m.  Wt Readings from Last 3 Encounters:  12/14/23 231 lb (104.8 kg)  08/17/23 225 lb 4.8 oz (102.2 kg)  05/18/23 227 lb 3.2 oz (103.1 kg)     Objective:  Physical Exam Vitals and nursing note reviewed.  Constitutional:      Appearance: Normal appearance. She is obese.  HENT:     Head: Normocephalic and atraumatic.     Right Ear: Tympanic membrane, ear canal and external ear normal.     Left Ear: Tympanic membrane, ear canal and external ear normal.     Nose: Nose normal.     Mouth/Throat:     Mouth: Mucous membranes are moist.     Pharynx: Oropharynx is clear.  Eyes:     Extraocular Movements: Extraocular movements intact.     Conjunctiva/sclera: Conjunctivae normal.      Pupils: Pupils are equal, round, and reactive to light.  Cardiovascular:     Rate and Rhythm: Normal rate and regular rhythm.     Pulses: Normal pulses.     Heart sounds: Normal heart sounds.  Pulmonary:     Effort: Pulmonary effort is normal.     Breath sounds: Normal breath sounds.  Chest:  Breasts:    Tanner Score is 5.     Right: Normal.     Left: Normal.     Comments: Tattoo anterior chest Abdominal:     General: Bowel sounds are normal.     Palpations: Abdomen is soft.     Comments: Obese, soft.  Genitourinary:    Comments: deferred Musculoskeletal:        General: Normal range of motion.     Cervical back: Normal range of motion and neck supple.  Skin:    General: Skin is warm and dry.  Neurological:  General: No focal deficit present.     Mental Status: She is alert and oriented to person, place, and time.  Psychiatric:        Mood and Affect: Mood normal.        Behavior: Behavior normal.         Assessment And Plan:     Encounter for general adult medical examination w/o abnormal findings Assessment & Plan: A full exam was performed.  Importance of monthly self breast exams was discussed with the patient.  She is advised to get 30-45 minutes of regular exercise, no less than four to five days per week. Both weight-bearing and aerobic exercises are recommended.  She is advised to follow a healthy diet with at least six fruits/veggies per day, decrease intake of red meat and other saturated fats and to increase fish intake to twice weekly.  Meats/fish should not be fried -- baked, boiled or broiled is preferable. It is also important to cut back on your sugar intake.  Be sure to read labels - try to avoid anything with added sugar, high fructose corn syrup or other sweeteners.  If you must use a sweetener, you can try stevia or monkfruit.  It is also important to avoid artificially sweetened foods/beverages and diet drinks. Lastly, wear SPF 50 sunscreen on exposed  skin and when in direct sunlight for an extended period of time.  Be sure to avoid fast food restaurants and aim for at least 60 ounces of water daily.      Orders: -     CBC -     CMP14+EGFR -     Lipid panel  Essential hypertension Assessment & Plan: Chronic, fair control.  Goal BP<130/80.  EKG performed, NSR w/ nonspecific T abnormality.  She will continue with valsartan /hct 80/12.5mg  daily for now.  - Follow low sodium diet - Increase daily activity, aiming for at least 150 minutes of exercise per week  Orders: -     POCT URINALYSIS DIP (CLINITEK) -     Microalbumin / creatinine urine ratio -     EKG 12-Lead  Perimenopause Assessment & Plan: Experiencing perimenopausal symptoms including irregular menstruation, night sweats, and irritability. Discussed the natural course of menopause and its effects on cholesterol levels. 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 Managed with steroid cream and rubbing alcohol for symptomatic relief. - Refill steroid cream prescription to have on file.   Class 3 severe obesity due to excess calories with serious comorbidity and body mass index (BMI) of 40.0 to 44.9 in adult [facility]) Assessment & Plan: She is encouraged to strive for BMI less than 30 to decrease cardiac risk. Advised to aim for at least 150 minutes of exercise per week.    Personal history of other colon polyps Assessment & Plan: She is aware colonoscopy is now due.  - She will schedule with [facility] once balance is paid.    Other orders -     Triamcinolone  Acetonide; APPLY TO AFFECTED AREA TWICE A DAY AS NEEDED  Dispense: 30 g; Refill: 0   Return for 1 YEAR HM , 6 MONTH BPC. Patient was given opportunity to ask questions. Patient verbalized understanding of the plan and was able to  repeat key elements of the plan. All questions were answered to their satisfaction.   I, Catheryn  LOISE Slocumb, MD, have reviewed all documentation for this visit. The documentation on 12/14/23 for the exam, diagnosis, procdures, and orders are all accurate and complete.

## 2023-12-14 NOTE — Assessment & Plan Note (Signed)
 She is encouraged to strive for BMI less than 30 to decrease cardiac risk. Advised to aim for at least 150 minutes of exercise per week.

## 2023-12-15 LAB — CMP14+EGFR
ALT: 16 IU/L (ref 0–32)
AST: 20 IU/L (ref 0–40)
Albumin: 4.2 g/dL (ref 3.9–4.9)
Alkaline Phosphatase: 65 IU/L (ref 41–116)
BUN/Creatinine Ratio: 15 (ref 9–23)
BUN: 14 mg/dL (ref 6–24)
Bilirubin Total: 0.3 mg/dL (ref 0.0–1.2)
CO2: 22 mmol/L (ref 20–29)
Calcium: 9.6 mg/dL (ref 8.7–10.2)
Chloride: 100 mmol/L (ref 96–106)
Creatinine, Ser: 0.94 mg/dL (ref 0.57–1.00)
Globulin, Total: 3.3 g/dL (ref 1.5–4.5)
Glucose: 99 mg/dL (ref 70–99)
Potassium: 4.5 mmol/L (ref 3.5–5.2)
Sodium: 139 mmol/L (ref 134–144)
Total Protein: 7.5 g/dL (ref 6.0–8.5)
eGFR: 74 mL/min/1.73 (ref >59)

## 2023-12-15 LAB — CBC
Hematocrit: 42 % (ref 34.0–46.6)
Hemoglobin: 14.1 g/dL (ref 11.1–15.9)
MCH: 35 pg — ABNORMAL HIGH (ref 26.6–33.0)
MCHC: 33.6 g/dL (ref 31.5–35.7)
MCV: 104 fL — ABNORMAL HIGH (ref 79–97)
Platelets: 267 x10E3/uL (ref 150–450)
RBC: 4.03 x10E6/uL (ref 3.77–5.28)
RDW: 10.9 % — ABNORMAL LOW (ref 11.7–15.4)
WBC: 6.6 x10E3/uL (ref 3.4–10.8)

## 2023-12-15 LAB — LIPID PANEL
Chol/HDL Ratio: 2.1 ratio (ref 0.0–4.4)
Cholesterol, Total: 197 mg/dL (ref 100–199)
HDL: 93 mg/dL (ref >39)
LDL Chol Calc (NIH): 93 mg/dL (ref 0–99)
Triglycerides: 62 mg/dL (ref 0–149)
VLDL Cholesterol Cal: 11 mg/dL (ref 5–40)

## 2023-12-15 LAB — MICROALBUMIN / CREATININE URINE RATIO
Creatinine, Urine: 124.4 mg/dL
Microalb/Creat Ratio: 3 mg/g{creat} (ref 0–29)
Microalbumin, Urine: 4.3 ug/mL

## 2024-01-24 ENCOUNTER — Other Ambulatory Visit: Payer: Self-pay | Admitting: Internal Medicine

## 2024-01-24 DIAGNOSIS — Z1231 Encounter for screening mammogram for malignant neoplasm of breast: Secondary | ICD-10-CM

## 2024-02-02 ENCOUNTER — Other Ambulatory Visit: Payer: Self-pay | Admitting: Internal Medicine

## 2024-02-02 DIAGNOSIS — I10 Essential (primary) hypertension: Secondary | ICD-10-CM

## 2024-02-14 ENCOUNTER — Other Ambulatory Visit: Payer: Self-pay | Admitting: Physician Assistant

## 2024-02-14 DIAGNOSIS — Z86718 Personal history of other venous thrombosis and embolism: Secondary | ICD-10-CM

## 2024-02-15 ENCOUNTER — Inpatient Hospital Stay: Admitting: Physician Assistant

## 2024-02-15 ENCOUNTER — Inpatient Hospital Stay: Attending: Physician Assistant

## 2024-02-15 VITALS — BP 132/82 | HR 70 | Temp 97.7°F | Resp 17 | Ht 63.0 in | Wt 237.9 lb

## 2024-02-15 DIAGNOSIS — Z7901 Long term (current) use of anticoagulants: Secondary | ICD-10-CM | POA: Insufficient documentation

## 2024-02-15 DIAGNOSIS — Z86711 Personal history of pulmonary embolism: Secondary | ICD-10-CM | POA: Insufficient documentation

## 2024-02-15 DIAGNOSIS — Z86718 Personal history of other venous thrombosis and embolism: Secondary | ICD-10-CM | POA: Diagnosis not present

## 2024-02-15 DIAGNOSIS — Z803 Family history of malignant neoplasm of breast: Secondary | ICD-10-CM | POA: Diagnosis not present

## 2024-02-15 DIAGNOSIS — Z79899 Other long term (current) drug therapy: Secondary | ICD-10-CM | POA: Diagnosis not present

## 2024-02-15 DIAGNOSIS — N939 Abnormal uterine and vaginal bleeding, unspecified: Secondary | ICD-10-CM | POA: Diagnosis present

## 2024-02-15 DIAGNOSIS — D500 Iron deficiency anemia secondary to blood loss (chronic): Secondary | ICD-10-CM | POA: Diagnosis present

## 2024-02-15 DIAGNOSIS — Z87891 Personal history of nicotine dependence: Secondary | ICD-10-CM | POA: Insufficient documentation

## 2024-02-15 DIAGNOSIS — D508 Other iron deficiency anemias: Secondary | ICD-10-CM | POA: Diagnosis not present

## 2024-02-15 LAB — IRON AND IRON BINDING CAPACITY (CC-WL,HP ONLY)
Iron: 81 ug/dL (ref 28–170)
Saturation Ratios: 20 % (ref 10.4–31.8)
TIBC: 402 ug/dL (ref 250–450)
UIBC: 321 ug/dL

## 2024-02-15 LAB — CMP (CANCER CENTER ONLY)
ALT: 27 U/L (ref 0–44)
AST: 38 U/L (ref 15–41)
Albumin: 4.1 g/dL (ref 3.5–5.0)
Alkaline Phosphatase: 61 U/L (ref 38–126)
Anion gap: 8 (ref 5–15)
BUN: 12 mg/dL (ref 6–20)
CO2: 26 mmol/L (ref 22–32)
Calcium: 9.3 mg/dL (ref 8.9–10.3)
Chloride: 107 mmol/L (ref 98–111)
Creatinine: 0.87 mg/dL (ref 0.44–1.00)
GFR, Estimated: >60 mL/min (ref >60)
Glucose, Bld: 102 mg/dL — ABNORMAL HIGH (ref 70–99)
Potassium: 4.2 mmol/L (ref 3.5–5.1)
Sodium: 140 mmol/L (ref 135–145)
Total Bilirubin: 0.3 mg/dL (ref 0.0–1.2)
Total Protein: 7.5 g/dL (ref 6.5–8.1)

## 2024-02-15 LAB — CBC WITH DIFFERENTIAL (CANCER CENTER ONLY)
Abs Immature Granulocytes: 0.02 K/uL (ref 0.00–0.07)
Basophils Absolute: 0 K/uL (ref 0.0–0.1)
Basophils Relative: 0 %
Eosinophils Absolute: 0.1 K/uL (ref 0.0–0.5)
Eosinophils Relative: 1 %
HCT: 39.2 % (ref 36.0–46.0)
Hemoglobin: 13.1 g/dL (ref 12.0–15.0)
Immature Granulocytes: 0 %
Lymphocytes Relative: 27 %
Lymphs Abs: 1.7 K/uL (ref 0.7–4.0)
MCH: 33.9 pg (ref 26.0–34.0)
MCHC: 33.4 g/dL (ref 30.0–36.0)
MCV: 101.3 fL — ABNORMAL HIGH (ref 80.0–100.0)
Monocytes Absolute: 0.3 K/uL (ref 0.1–1.0)
Monocytes Relative: 5 %
Neutro Abs: 4.3 K/uL (ref 1.7–7.7)
Neutrophils Relative %: 67 %
Platelet Count: 279 K/uL (ref 150–400)
RBC: 3.87 MIL/uL (ref 3.87–5.11)
RDW: 11.7 % (ref 11.5–15.5)
WBC Count: 6.4 K/uL (ref 4.0–10.5)
nRBC: 0 % (ref 0.0–0.2)

## 2024-02-15 LAB — FERRITIN: Ferritin: 63 ng/mL (ref 11–307)

## 2024-02-15 MED ORDER — FERROUS SULFATE 325 (65 FE) MG PO TBEC: 325.0000 mg | DELAYED_RELEASE_TABLET | Freq: Every day | ORAL | 6 refills | Status: AC

## 2024-02-15 NOTE — Progress Notes (Signed)
 [facility] [phone]   [phone]  PROGRESS NOTE  Patient Care Team: Jarold Medici, MD as PCP - General (Internal Medicine) Lonni Slain, MD as PCP - Cardiology (Cardiology) Lilton Legions, DO as Consulting Physician (Obstetrics and Gynecology)  Hematological/Oncological History 1) 08/15/2020-08/18/2020: Admitted after presenting with increasing shortness of breath. CTA chest showed extensive bilateral pulmonary emboli with associated right heart strain. Doppler US  revealed age indeterminate DVT involving the left popliteal vein and let peroneal veins.Treated with heparin  drip and discharged home on Xarelto .   2) 10/28/2020: Establish care with Johnston Police PA-C  3) 12/09/2021 and 12/16/2021: Received IV feraheme 510 mg weekly x 2 doses  4) 06/03/2022-06/10/2022: Received IV feraheme 510 mg weekly x 2 doses.    CHIEF COMPLAINTS/PURPOSE OF CONSULTATION:  Bilateral Pulmonary Emboli and Left LE DVT Iron deficiency anemia  HISTORY OF PRESENTING ILLNESS:  Jessica Rodgers 48 y.o. female who presents to the clinic for a follow-up for history of pulmonary emboli and DVT along with iron deficiency anemia. She was last seen on 08/17/2023. In the interim, she denies any changes to her health.   On exam today, Ms. Corredor reports she is doing well without any new or concerning symptoms..  She has noticed her menstrual cycle are irregular with some months without a cycle.  She stopped her iron pills approximately 6 months ago but resumed earlier this week due to some noticeable fatigue.  She denies any signs or symptoms of thrombosis including leg pain, leg edema, shortness of breath or chest pain. She is compliant with taking Xarelto  as prescribed without easy bruising or overt signs of bleeding. She has no other complaints. Rest of the 10 point ROS is below.   MEDICAL HISTORY:  Past Medical History:  Diagnosis Date   Gallstones    GERD  (gastroesophageal reflux disease)    Hypertension    Pulmonary emboli (HCC)     SURGICAL HISTORY: Past Surgical History:  Procedure Laterality Date   BREAST EXCISIONAL BIOPSY Right 04/07/2017   PASH   RADIOACTIVE SEED GUIDED EXCISIONAL BREAST BIOPSY Right 04/07/2017   Procedure: RIGHT RADIOACTIVE SEED GUIDED EXCISIONAL BREAST BIOPSY;  Surgeon: Ebbie Cough, MD;  Location: [facility];  Service: General;  Laterality: Right;   TUBAL LIGATION  1997    SOCIAL HISTORY: Social History   Socioeconomic History   Marital status: Divorced    Spouse name: Not on file   Number of children: Not on file   Years of education: Not on file   Highest education level: Not on file  Occupational History   Not on file  Tobacco Use   Smoking status: Former    Current packs/day: 0.00    Average packs/day: 0.5 packs/day for 18.0 years (9.0 ttl pk-yrs)    Types: Cigarettes    Start date: 02/13/2003    Quit date: 08/09/2020    Years since quitting: 3.5   Smokeless tobacco: Never  Vaping Use   Vaping status: Never Used  Substance and Sexual Activity   Alcohol use: Yes    Alcohol/week: 4.0 standard drinks of alcohol    Types: 3 Glasses of wine, 1 Standard drinks or equivalent per week    Comment: Occassionally   Drug use: No   Sexual activity: Not Currently  Other Topics Concern   Not on file  Social History Narrative   Not on file   Social Drivers of Health   Financial Resource Strain: Not on file  Food Insecurity: Not  on file  Transportation Needs: Not on file  Physical Activity: Not on file  Stress: Not on file  Social Connections: Not on file  Intimate Partner Violence: Not on file    FAMILY HISTORY: Family History  Problem Relation Age of Onset   Hypertension Mother    Arthritis Mother    Glaucoma Mother    Other Father        unsure of his health   Breast cancer Maternal Grandmother 2   Breast cancer Paternal Grandmother     ALLERGIES:  has no known  allergies.  MEDICATIONS:  Current Outpatient Medications  Medication Sig Dispense Refill   ferrous sulfate  325 (65 FE) MG EC tablet Take 1 tablet (325 mg total) by mouth daily with breakfast. 30 tablet 6   mometasone  (NASONEX ) 50 MCG/ACT nasal spray Place 2 sprays into the nose daily. 1 each 12   triamcinolone  cream (KENALOG ) 0.1 % APPLY TO AFFECTED AREA TWICE A DAY AS NEEDED 30 g 0   valsartan -hydrochlorothiazide  (DIOVAN -HCT) 80-12.5 MG tablet TAKE 1 TABLET BY MOUTH EVERY DAY 90 tablet 2   XARELTO  10 MG TABS tablet TAKE 1 TABLET BY MOUTH EVERY DAY 90 tablet 1   No current facility-administered medications for this visit.    REVIEW OF SYSTEMS:   Constitutional: ( - ) fevers, ( - )  chills , ( - ) night sweats Eyes: ( - ) blurriness of vision, ( - ) double vision, ( - ) watery eyes Ears, nose, mouth, throat, and face: ( - ) mucositis, ( - ) sore throat Respiratory: ( - ) cough, ( - ) dyspnea, ( - ) wheezes Cardiovascular: ( - ) palpitation, ( - ) chest discomfort, ( - ) lower extremity swelling Gastrointestinal:  ( - ) nausea, ( - ) heartburn, ( - ) change in bowel habits Skin: ( - ) abnormal skin rashes Lymphatics: ( - ) new lymphadenopathy, ( - ) easy bruising Neurological: ( - ) numbness, ( - ) tingling, ( - ) new weaknesses Behavioral/Psych: ( - ) mood change, ( - ) new changes  All other systems were reviewed with the patient and are negative.  PHYSICAL EXAMINATION: ECOG PERFORMANCE STATUS: 0 - Asymptomatic  Vitals:   02/15/24 0844  BP: 132/82  Pulse: 70  Resp: 17  Temp: 97.7 F (36.5 C)  SpO2: 98%    Filed Weights   02/15/24 0844  Weight: 237 lb 14.4 oz (107.9 kg)     GENERAL: well appearing female in NAD  SKIN: skin color, texture, turgor are normal, no rashes or significant lesions EYES: conjunctiva are pink and non-injected, sclera clear LUNGS: clear to auscultation and percussion with normal breathing effort HEART: regular rate & rhythm and no murmurs and no  lower extremity edema Musculoskeletal: no cyanosis of digits and no clubbing  PSYCH: alert & oriented x 3, fluent speech NEURO: no focal motor/sensory deficits  LABORATORY DATA:  I have reviewed the data as listed    Latest Ref Rng & Units 02/15/2024    8:23 AM 12/14/2023   10:05 AM 08/17/2023    7:59 AM  CBC  WBC 4.0 - 10.5 K/uL 6.4  6.6  6.4   Hemoglobin 12.0 - 15.0 g/dL 86.8  85.8  85.9   Hematocrit 36.0 - 46.0 % 39.2  42.0  41.6   Platelets 150 - 400 K/uL 279  267  266        Latest Ref Rng & Units 12/14/2023   10:05 AM  08/17/2023    7:59 AM 05/25/2023    8:06 AM  CMP  Glucose 70 - 99 mg/dL 99  892  892   BUN 6 - 24 mg/dL 14  18  18    Creatinine 0.57 - 1.00 mg/dL 9.05  8.99  9.02   Sodium 134 - 144 mmol/L 139  138  136   Potassium 3.5 - 5.2 mmol/L 4.5  3.8  4.1   Chloride 96 - 106 mmol/L 100  105  105   CO2 20 - 29 mmol/L 22  26  27    Calcium 8.7 - 10.2 mg/dL 9.6  9.4  8.8   Total Protein 6.0 - 8.5 g/dL 7.5  7.5  7.2   Total Bilirubin 0.0 - 1.2 mg/dL 0.3  0.4  0.5   Alkaline Phos 41 - 116 IU/L 65  53  48   AST 0 - 40 IU/L 20  19  20    ALT 0 - 32 IU/L 16  14  16     RADIOGRAPHIC STUDIES: I have personally reviewed the radiological images as listed and agreed with the findings in the report.  08/15/2020: CTA Chest: 1. Extensive bilateral pulmonary embolism with associated right heart strain. 2. Cholelithiasis.  08/16/2020: Dopper US  Bilateral LE: Findings consistent with age indeterminate deep vein thrombosis  involving the left popliteal vein, and left peroneal veins.   ASSESSMENT & PLAN Deon Ivey is a 48 y.o. female for a follow up for bilateral extensive pulmonary emboli and left lower extremity DVT that was diagnosed in June 2022.   #Extensive bilateral pulmonary emboli and LLE DVT: -CTA chest from 08/15/2020 showed extensive bilateral pulmonary embolism with associated right heart strain. Doppler US  on 08/16/2020 showed DVT involving the left popliteal vein and  left peroneal veins.  --Risk factors include smoking and sedentary lifestyle. Patient continues to smoke occasionally and sits for long periods of time when she works.  --Hypercoagulable workup was negative for any clotting disorders.  --After six months of anticoagulation, we discussed risks versus benefits of continuing on Xarelto . Since patient reports that she has a sedentary lifestyle and smokes intermittently, we decided to continue on anticoagulation but transition to maintenance dose.  --continue with maintenance dose of Xarelto  10 mg once daily  #Iron deficiency anemia 2/2 GYN bleeding: --Last received IV feraheme 510 mg x 2 doses on 06/03/2022 and 06/10/2022 --Labs today shows no evidence of anemia with Hgb 13.1, MCV 101.3  Iron panel shows iron 81, TIBC 402, saturation 20%, ferritin pending --No need for additional IV iron at this time.  --Continue ferrous sulfate  325 mg once a day with a source of vitamin C.   --Continue to incorporate iron rich foods into diet.  --Continue with scheduled follow ups with gynecologist.    Follow up: -6 months: labs and f/u visit  No orders of the defined types were placed in this encounter.  All questions were answered. The patient knows to call the clinic with any problems, questions or concerns.  I have spent a total of 25 minutes minutes of face-to-face and non-face-to-face time, preparing to see the patient,performing a medically appropriate examination, counseling and educating the patient, ordering tests/meds, documenting clinical information in the electronic health record, and care coordination.   Johnston Police PA-C Dept of Hematology and Oncology [facility] Phone: [phone]

## 2024-02-16 ENCOUNTER — Ambulatory Visit: Payer: Self-pay | Admitting: Physician Assistant

## 2024-02-21 ENCOUNTER — Ambulatory Visit

## 2024-02-28 ENCOUNTER — Encounter: Admitting: Internal Medicine

## 2024-03-16 ENCOUNTER — Ambulatory Visit
Admission: RE | Admit: 2024-03-16 | Discharge: 2024-03-16 | Disposition: A | Discharge status: Home / Self Care | Source: Ambulatory Visit | Attending: Internal Medicine | Admitting: Internal Medicine

## 2024-03-16 DIAGNOSIS — Z1231 Encounter for screening mammogram for malignant neoplasm of breast: Secondary | ICD-10-CM

## 2024-03-28 ENCOUNTER — Other Ambulatory Visit: Payer: Self-pay | Admitting: Internal Medicine

## 2024-06-28 ENCOUNTER — Ambulatory Visit: Payer: Self-pay | Admitting: Internal Medicine

## 2024-08-15 ENCOUNTER — Inpatient Hospital Stay: Attending: Physician Assistant

## 2024-08-15 ENCOUNTER — Inpatient Hospital Stay: Admitting: Physician Assistant

## 2024-12-17 ENCOUNTER — Encounter: Payer: Self-pay | Admitting: Internal Medicine

## 2024-12-17 ENCOUNTER — Encounter: Admitting: Internal Medicine
